# Patient Record
Sex: Female | Born: 1990 | Race: Black or African American | Hispanic: No | Marital: Single | Smoking: Never smoker
Health system: Southern US, Community
[De-identification: ages and names within clinical notes are randomized; demographics above are authoritative.]

## PROBLEM LIST (undated history)

## (undated) DIAGNOSIS — Z973 Presence of spectacles and contact lenses: Secondary | ICD-10-CM

## (undated) DIAGNOSIS — N201 Calculus of ureter: Secondary | ICD-10-CM

## (undated) DIAGNOSIS — R002 Palpitations: Secondary | ICD-10-CM

## (undated) DIAGNOSIS — R569 Unspecified convulsions: Secondary | ICD-10-CM

## (undated) DIAGNOSIS — D509 Iron deficiency anemia, unspecified: Secondary | ICD-10-CM

## (undated) DIAGNOSIS — Z87898 Personal history of other specified conditions: Secondary | ICD-10-CM

## (undated) DIAGNOSIS — G43909 Migraine, unspecified, not intractable, without status migrainosus: Secondary | ICD-10-CM

## (undated) DIAGNOSIS — G5600 Carpal tunnel syndrome, unspecified upper limb: Secondary | ICD-10-CM

## (undated) DIAGNOSIS — R3915 Urgency of urination: Secondary | ICD-10-CM

## (undated) DIAGNOSIS — N200 Calculus of kidney: Secondary | ICD-10-CM

## (undated) DIAGNOSIS — G5603 Carpal tunnel syndrome, bilateral upper limbs: Secondary | ICD-10-CM

## (undated) DIAGNOSIS — Z87442 Personal history of urinary calculi: Secondary | ICD-10-CM

## (undated) DIAGNOSIS — F3181 Bipolar II disorder: Secondary | ICD-10-CM

## (undated) HISTORY — PX: APPENDECTOMY: SHX54

## (undated) HISTORY — DX: Bipolar II disorder: F31.81

---
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
## 2008-02-03 ENCOUNTER — Telehealth: Payer: Self-pay | Admitting: Family Medicine

## 2008-02-15 ENCOUNTER — Ambulatory Visit: Payer: Self-pay | Admitting: Family Medicine

## 2008-02-15 LAB — CONVERTED CEMR LAB: Beta hcg, urine, semiquantitative: NEGATIVE

## 2008-06-28 ENCOUNTER — Ambulatory Visit: Payer: Self-pay | Admitting: Family Medicine

## 2008-06-28 LAB — CONVERTED CEMR LAB: Beta hcg, urine, semiquantitative: NEGATIVE

## 2008-08-11 ENCOUNTER — Ambulatory Visit: Payer: Self-pay | Admitting: Family Medicine

## 2008-08-11 LAB — CONVERTED CEMR LAB
Bilirubin Urine: NEGATIVE
Glucose, Urine, Semiquant: NEGATIVE
Ketones, urine, test strip: NEGATIVE
Nitrite: NEGATIVE
Protein, U semiquant: >=300
Specific Gravity, Urine: 1.02
Urobilinogen, UA: 0.2
WBC Urine, dipstick: NEGATIVE
pH: 7

## 2008-08-12 DIAGNOSIS — F319 Bipolar disorder, unspecified: Secondary | ICD-10-CM | POA: Insufficient documentation

## 2008-08-12 DIAGNOSIS — F17200 Nicotine dependence, unspecified, uncomplicated: Secondary | ICD-10-CM | POA: Insufficient documentation

## 2008-10-04 ENCOUNTER — Ambulatory Visit: Payer: Self-pay | Admitting: Family Medicine

## 2008-11-07 ENCOUNTER — Telehealth: Payer: Self-pay | Admitting: Family Medicine

## 2008-11-08 ENCOUNTER — Ambulatory Visit: Payer: Self-pay | Admitting: Family Medicine

## 2008-11-08 ENCOUNTER — Encounter (INDEPENDENT_AMBULATORY_CARE_PROVIDER_SITE_OTHER): Payer: Self-pay

## 2008-11-08 DIAGNOSIS — J019 Acute sinusitis, unspecified: Secondary | ICD-10-CM | POA: Insufficient documentation

## 2008-11-08 DIAGNOSIS — J209 Acute bronchitis, unspecified: Secondary | ICD-10-CM | POA: Insufficient documentation

## 2008-11-29 ENCOUNTER — Encounter: Payer: Self-pay | Admitting: Family Medicine

## 2008-12-28 ENCOUNTER — Ambulatory Visit: Payer: Self-pay | Admitting: Family Medicine

## 2009-01-17 ENCOUNTER — Ambulatory Visit: Payer: Self-pay | Admitting: Family Medicine

## 2009-01-17 ENCOUNTER — Encounter: Payer: Self-pay | Admitting: Family Medicine

## 2009-01-17 ENCOUNTER — Other Ambulatory Visit: Admission: RE | Admit: 2009-01-17 | Discharge: 2009-01-17 | Payer: Self-pay | Admitting: Family Medicine

## 2009-01-17 DIAGNOSIS — N760 Acute vaginitis: Secondary | ICD-10-CM | POA: Insufficient documentation

## 2009-01-17 DIAGNOSIS — N3000 Acute cystitis without hematuria: Secondary | ICD-10-CM | POA: Insufficient documentation

## 2009-01-17 DIAGNOSIS — H547 Unspecified visual loss: Secondary | ICD-10-CM | POA: Insufficient documentation

## 2009-01-17 DIAGNOSIS — R5381 Other malaise: Secondary | ICD-10-CM | POA: Insufficient documentation

## 2009-01-17 DIAGNOSIS — R5383 Other fatigue: Secondary | ICD-10-CM

## 2009-01-17 LAB — CONVERTED CEMR LAB
Bilirubin Urine: NEGATIVE
Chlamydia, DNA Probe: NEGATIVE
GC Probe Amp, Genital: NEGATIVE
Glucose, Urine, Semiquant: NEGATIVE
Ketones, urine, test strip: NEGATIVE
Nitrite: NEGATIVE
Protein, U semiquant: NEGATIVE
Specific Gravity, Urine: 1.025
Urobilinogen, UA: 0.2
WBC Urine, dipstick: NEGATIVE
pH: 6.5

## 2009-01-18 ENCOUNTER — Encounter: Payer: Self-pay | Admitting: Family Medicine

## 2009-01-18 LAB — CONVERTED CEMR LAB
Candida species: NEGATIVE
Gardnerella vaginalis: POSITIVE — AB

## 2009-03-08 ENCOUNTER — Telehealth: Payer: Self-pay | Admitting: Family Medicine

## 2009-03-21 ENCOUNTER — Ambulatory Visit: Payer: Self-pay | Admitting: Family Medicine

## 2009-04-16 ENCOUNTER — Encounter: Payer: Self-pay | Admitting: Family Medicine

## 2009-04-18 ENCOUNTER — Ambulatory Visit: Payer: Self-pay | Admitting: Family Medicine

## 2009-04-19 LAB — CONVERTED CEMR LAB
BUN: 11 mg/dL (ref 6–23)
CO2: 21 meq/L (ref 19–32)
Calcium: 10 mg/dL (ref 8.4–10.5)
Chloride: 105 meq/L (ref 96–112)
Cholesterol: 168 mg/dL (ref 0–200)
Creatinine, Ser: 0.79 mg/dL (ref 0.40–1.20)
Glucose, Bld: 85 mg/dL (ref 70–99)
HCT: 38.5 % (ref 36.0–46.0)
HDL: 37 mg/dL — ABNORMAL LOW (ref >39)
Hemoglobin: 12.9 g/dL (ref 12.0–15.0)
LDL Cholesterol: 122 mg/dL — ABNORMAL HIGH (ref 0–99)
MCHC: 33.5 g/dL (ref 30.0–36.0)
MCV: 91.4 fL (ref 78.0–100.0)
Platelets: 239 10*3/uL (ref 150–400)
Potassium: 5.3 meq/L (ref 3.5–5.3)
RBC: 4.21 M/uL (ref 3.87–5.11)
RDW: 12.4 % (ref 11.5–15.5)
Sodium: 143 meq/L (ref 135–145)
TSH: 0.891 microintl units/mL (ref 0.700–6.400)
Total CHOL/HDL Ratio: 4.5
Triglycerides: 44 mg/dL (ref <150)
VLDL: 9 mg/dL (ref 0–40)
WBC: 5.1 10*3/uL (ref 4.0–10.5)

## 2009-04-22 DIAGNOSIS — R635 Abnormal weight gain: Secondary | ICD-10-CM | POA: Insufficient documentation

## 2009-04-22 DIAGNOSIS — R519 Headache, unspecified: Secondary | ICD-10-CM | POA: Insufficient documentation

## 2009-04-22 DIAGNOSIS — R51 Headache: Secondary | ICD-10-CM | POA: Insufficient documentation

## 2009-04-23 ENCOUNTER — Telehealth: Payer: Self-pay | Admitting: Family Medicine

## 2009-04-25 ENCOUNTER — Encounter: Payer: Self-pay | Admitting: Family Medicine

## 2009-05-02 ENCOUNTER — Ambulatory Visit: Payer: Self-pay | Admitting: Family Medicine

## 2009-05-02 DIAGNOSIS — J040 Acute laryngitis: Secondary | ICD-10-CM | POA: Insufficient documentation

## 2009-05-14 ENCOUNTER — Encounter: Payer: Self-pay | Admitting: Family Medicine

## 2009-05-19 DIAGNOSIS — Q283 Other malformations of cerebral vessels: Secondary | ICD-10-CM

## 2009-05-19 HISTORY — DX: Other malformations of cerebral vessels: Q28.3

## 2009-05-24 ENCOUNTER — Ambulatory Visit: Payer: Self-pay | Admitting: Family Medicine

## 2009-05-24 DIAGNOSIS — R112 Nausea with vomiting, unspecified: Secondary | ICD-10-CM | POA: Insufficient documentation

## 2009-05-25 ENCOUNTER — Encounter: Payer: Self-pay | Admitting: Family Medicine

## 2009-06-06 ENCOUNTER — Encounter: Payer: Self-pay | Admitting: Family Medicine

## 2009-06-11 ENCOUNTER — Telehealth: Payer: Self-pay | Admitting: Family Medicine

## 2009-06-11 ENCOUNTER — Ambulatory Visit (HOSPITAL_COMMUNITY): Admission: RE | Admit: 2009-06-11 | Discharge: 2009-06-11 | Payer: Self-pay | Admitting: Neurology

## 2009-06-13 ENCOUNTER — Encounter (INDEPENDENT_AMBULATORY_CARE_PROVIDER_SITE_OTHER): Payer: Self-pay

## 2009-06-13 ENCOUNTER — Ambulatory Visit: Payer: Self-pay | Admitting: Family Medicine

## 2009-06-13 DIAGNOSIS — M542 Cervicalgia: Secondary | ICD-10-CM | POA: Insufficient documentation

## 2009-06-15 ENCOUNTER — Ambulatory Visit (HOSPITAL_COMMUNITY): Admission: RE | Admit: 2009-06-15 | Discharge: 2009-06-15 | Payer: Self-pay | Admitting: Neurology

## 2009-06-19 ENCOUNTER — Encounter: Payer: Self-pay | Admitting: Family Medicine

## 2009-06-19 ENCOUNTER — Encounter (HOSPITAL_COMMUNITY): Admission: RE | Admit: 2009-06-19 | Discharge: 2009-07-19 | Payer: Self-pay | Admitting: Family Medicine

## 2009-06-29 ENCOUNTER — Encounter: Payer: Self-pay | Admitting: Family Medicine

## 2009-07-05 ENCOUNTER — Telehealth: Payer: Self-pay | Admitting: Family Medicine

## 2009-07-06 ENCOUNTER — Encounter: Payer: Self-pay | Admitting: Family Medicine

## 2009-07-13 ENCOUNTER — Encounter: Payer: Self-pay | Admitting: Family Medicine

## 2009-07-20 ENCOUNTER — Encounter (HOSPITAL_COMMUNITY): Admission: RE | Admit: 2009-07-20 | Discharge: 2009-08-19 | Payer: Self-pay | Admitting: Family Medicine

## 2009-07-25 ENCOUNTER — Ambulatory Visit: Payer: Self-pay | Admitting: Family Medicine

## 2009-08-06 ENCOUNTER — Telehealth: Payer: Self-pay | Admitting: Family Medicine

## 2009-09-19 ENCOUNTER — Ambulatory Visit: Payer: Self-pay | Admitting: Family Medicine

## 2009-09-19 LAB — CONVERTED CEMR LAB: Beta hcg, urine, semiquantitative: NEGATIVE

## 2009-11-14 ENCOUNTER — Telehealth: Payer: Self-pay | Admitting: Family Medicine

## 2009-12-12 ENCOUNTER — Ambulatory Visit: Payer: Self-pay | Admitting: Family Medicine

## 2009-12-12 DIAGNOSIS — J9801 Acute bronchospasm: Secondary | ICD-10-CM | POA: Insufficient documentation

## 2009-12-13 ENCOUNTER — Emergency Department (HOSPITAL_COMMUNITY): Admission: EM | Admit: 2009-12-13 | Discharge: 2009-12-13 | Payer: Self-pay | Admitting: Emergency Medicine

## 2009-12-20 ENCOUNTER — Encounter (INDEPENDENT_AMBULATORY_CARE_PROVIDER_SITE_OTHER): Payer: Self-pay | Admitting: General Surgery

## 2009-12-20 ENCOUNTER — Observation Stay (HOSPITAL_COMMUNITY): Admission: EM | Admit: 2009-12-20 | Discharge: 2009-12-21 | Payer: Self-pay | Admitting: Emergency Medicine

## 2009-12-20 HISTORY — PX: LAPAROSCOPIC APPENDECTOMY: SHX408

## 2010-02-04 ENCOUNTER — Ambulatory Visit: Payer: Self-pay | Admitting: Family Medicine

## 2010-02-04 ENCOUNTER — Other Ambulatory Visit: Admission: RE | Admit: 2010-02-04 | Discharge: 2010-02-04 | Payer: Self-pay | Admitting: Family Medicine

## 2010-02-05 ENCOUNTER — Encounter: Payer: Self-pay | Admitting: Family Medicine

## 2010-02-05 LAB — CONVERTED CEMR LAB
Candida species: NEGATIVE
Chlamydia, DNA Probe: NEGATIVE
GC Probe Amp, Genital: NEGATIVE
Gardnerella vaginalis: NEGATIVE

## 2010-02-07 ENCOUNTER — Telehealth: Payer: Self-pay | Admitting: Family Medicine

## 2010-02-08 LAB — CONVERTED CEMR LAB: Pap Smear: NEGATIVE

## 2010-02-17 ENCOUNTER — Emergency Department (HOSPITAL_COMMUNITY): Admission: EM | Admit: 2010-02-17 | Discharge: 2010-02-18 | Payer: Self-pay | Admitting: Emergency Medicine

## 2010-02-19 ENCOUNTER — Telehealth: Payer: Self-pay | Admitting: Family Medicine

## 2010-02-20 ENCOUNTER — Telehealth: Payer: Self-pay | Admitting: Family Medicine

## 2010-02-22 ENCOUNTER — Emergency Department (HOSPITAL_COMMUNITY): Admission: EM | Admit: 2010-02-22 | Discharge: 2010-02-22 | Payer: Self-pay | Admitting: Emergency Medicine

## 2010-02-27 ENCOUNTER — Ambulatory Visit: Payer: Self-pay | Admitting: Family Medicine

## 2010-03-08 ENCOUNTER — Telehealth: Payer: Self-pay | Admitting: Family Medicine

## 2010-03-08 ENCOUNTER — Emergency Department (HOSPITAL_COMMUNITY): Admission: EM | Admit: 2010-03-08 | Discharge: 2010-03-08 | Payer: Self-pay | Admitting: Emergency Medicine

## 2010-05-16 ENCOUNTER — Emergency Department (HOSPITAL_COMMUNITY)
Admission: EM | Admit: 2010-05-16 | Discharge: 2010-05-16 | Payer: Self-pay | Source: Home / Self Care | Admitting: Emergency Medicine

## 2010-05-16 ENCOUNTER — Ambulatory Visit: Payer: Self-pay | Admitting: Family Medicine

## 2010-05-21 ENCOUNTER — Encounter: Payer: Self-pay | Admitting: Family Medicine

## 2010-05-22 ENCOUNTER — Ambulatory Visit: Admit: 2010-05-22 | Payer: Self-pay | Admitting: Family Medicine

## 2010-05-22 ENCOUNTER — Encounter: Payer: Self-pay | Admitting: Family Medicine

## 2010-06-18 NOTE — Progress Notes (Signed)
Summary: patient at ER  Phone Note Call from Patient   Summary of Call: patient called back in and states she is at the ER. Initial call taken by: Curtis Sites,  March 08, 2010 2:27 PM  Follow-up for Phone Call        noted Follow-up by: Adella Hare LPN,  March 08, 2010 2:31 PM

## 2010-06-18 NOTE — Miscellaneous (Signed)
Summary: Rehab Report  Rehab Report   Imported By: Lind Guest 07/23/2009 10:02:01  _____________________________________________________________________  External Attachment:    Type:   Image     Comment:   External Document

## 2010-06-18 NOTE — Progress Notes (Signed)
Summary: SPEAK WITH NURSE  Phone Note Call from Patient   Summary of Call: NEEDS TO SPEAK WITH NURSE ABOUT DAUGHTER. [facility] [phone] Initial call taken by: Rudene Anda,  February 03, 2008 3:25 PM  Follow-up for Phone Call        PATIENT UNAWARE OF WHY MOTHER CALLED, ADVISED IF MOTHER NEEDS ANYTHING TO HAVE HER CALL THE OFFICE Follow-up by: Worthy Keeler LPN,  February 03, 2008 3:59 PM

## 2010-06-18 NOTE — Assessment & Plan Note (Signed)
Summary: office visit  Nurse Visit   Allergies: 1)  ! * Rubbing Alcohol  Medication Administration  Injection # 1:    Medication: Depo-Provera 150mg     Diagnosis: CONTRACEPTIVE MANAGEMENT (ICD-V25.09)    Route: IM    Site: RUOQ gluteus    Exp Date: 1/14    Lot #: Haywood Pao    Mfr: [email or web address]    Patient tolerated injection without complications    Given by: Adella Hare LPN (February 27, 2010 4:07 PM)  Orders Added: 1)  Depo-Provera 150mg  [J1055] 2)  Admin of Therapeutic Inj  intramuscular or subcutaneous [[id number]]   Medication Administration  Injection # 1:    Medication: Depo-Provera 150mg     Diagnosis: CONTRACEPTIVE MANAGEMENT (ICD-V25.09)    Route: IM    Site: RUOQ gluteus    Exp Date: 1/14    Lot #: Haywood Pao    Mfr: greenstone    Patient tolerated injection without complications    Given by: Adella Hare LPN (February 27, 2010 4:07 PM)  Orders Added: 1)  Depo-Provera 150mg  [J1055] 2)  Admin of Therapeutic Inj  intramuscular or subcutaneous [[id number]]

## 2010-06-18 NOTE — Progress Notes (Signed)
Summary: RX  Phone Note Call from Patient   Summary of Call: NEEDS RX FOR FOR HER DEPAKOTE 500MG   SHE TAKES 3 TIMES DAILY   [facility] [place]  [facility] AND TELL THEM WHEN THE RX IS SENT IN  [phone] Initial call taken by: Lind Guest,  December 21, 2007 2:19 PM  Follow-up for Phone Call        pt needs script written doesnt have prescription Follow-up by: Worthy Keeler LPN,  December 21, 2007 2:39 PM  Additional Follow-up for Phone Call Additional follow up Details #1::        called refill in to pharmacy and left pt message Additional Follow-up by: Worthy Keeler LPN,  December 22, 2007 11:48 AM    New/Updated Medications: DEPAKOTE 500 MG  TBEC (DIVALPROEX SODIUM) one tab by mouth two times a day   Prescriptions: DEPAKOTE 500 MG  TBEC (DIVALPROEX SODIUM) one tab by mouth two times a day  #60 x 2   Entered by:   Worthy Keeler LPN   Authorized by:   Syliva Overman MD   Signed by:   Worthy Keeler LPN on 09/81/1914   Method used:   Telephoned to ...         RxID:   [id number]

## 2010-06-18 NOTE — Assessment & Plan Note (Signed)
Summary: office visit   Allergies: 1)  ! * Rubbing Alcohol   Complete Medication List: 1)  Depakote 500 Mg Tbec (Divalproex sodium) .... Two tabs by mouth in the morning and one at night 2)  Singulair 10 Mg Tabs (Montelukast sodium) .... Take 1 tablet by mouth once a day 3)  Proventil Hfa 108 (90 Base) Mcg/act Aers (Albuterol sulfate) .... 2 puffs every 6 to 8 hrs. as needed 4)  Flagyl 500 Mg Tabs (Metronidazole) .... One tab by mouth bid 5)  Ibuprofen 800 Mg Tabs (Ibuprofen) .... Take 1 tablet by mouth two times a day as needed pt left without being seen, came in the following 2 days

## 2010-06-18 NOTE — Miscellaneous (Signed)
Summary: Rehab Report  Rehab Report   Imported By: Lind Guest 09/20/2009 13:21:50  _____________________________________________________________________  External Attachment:    Type:   Image     Comment:   External Document

## 2010-06-18 NOTE — Letter (Signed)
Summary: Out of Work  [facility]  [address]   [place], [place] [address]   Phone: [phone]  Fax: [phone]    June 13, 2009   Employee:  Marie Brown    To Whom It May Concern:   For Medical reasons, please excuse the above named employee from work for the following dates:  Start:   06/08/2009  End:   07/04/2009 Without Restrictions  If you need additional information, please feel free to contact our office.         Sincerely,    Milus Mallick. Lodema Hong, M.D.

## 2010-06-18 NOTE — Assessment & Plan Note (Signed)
Summary: office visit   Vital Signs:  Patient profile:   20 year old female Menstrual status:  depo with spotting Height:      64 inches Weight:      141 pounds BMI:     24.29 Pulse rate:   75 / minute Pulse rhythm:   regular Resp:     16 per minute BP sitting:   110 / 70 Cuff size:   regular  Vitals Entered By: Everitt Amber (April 18, 2009 10:05 AM) CC: She is concerned about her weight gain, feeling tired all the time, also her period comes on 2 times in a month and its irregular and cramps are really bad   Primary Care Provider:  Syliva Overman MD  CC:  She is concerned about her weight gain, feeling tired all the time, and also her period comes on 2 times in a month and its irregular and cramps are really bad.  History of Present Illness: The pt has a 2 day h/o increased head and chest congestion, with chills and fever. Her sputum and nasal drainage are yellow. She and her mother are very concerned about her weight gain. She does eat alot of fast food andis on no regular exercise program. Weight has not been an issue in the past, but with depo use this is now apparently and issue. Seh hsalso had a small amt of spotting with the depo.    Current Medications (verified): 1)  Depakote 500 Mg  Tbec (Divalproex Sodium) .... Two Tabs By Mouth in The Morning and One At Night 2)  Singulair 10 Mg Tabs (Montelukast Sodium) .... Take 1 Tablet By Mouth Once A Day 3)  Proventil Hfa 108 (90 Base) Mcg/act Aers (Albuterol Sulfate) .... 2 Puffs Every 6 To 8 Hrs. As Needed 4)  Ibuprofen 800 Mg Tabs (Ibuprofen) .... Take 1 Tablet By Mouth Two Times A Day As Needed  Allergies (verified): 1)  ! * Rubbing Alcohol  Review of Systems      See HPI General:  Complains of chills, fatigue, and malaise; 2 day history. ENT:  Complains of earache, sinus pressure, and sore throat; 2 day history. CV:  Denies chest pain or discomfort, palpitations, and swelling of feet. Resp:  Complains of cough and  sputum productive; 2 day hiastory, clear sputum. GI:  Denies abdominal pain, constipation, diarrhea, nausea, and vomiting. GU:  Complains of abnormal vaginal bleeding; denies discharge and dysuria. MS:  Denies joint pain and stiffness. Neuro:  Complains of headaches; migraines have resumed this week daily, last week was twice. Psych:  Complains of mental problems; denies suicidal thoughts/plans, thoughts of violence, and unusual visions or sounds.  Physical Exam  General:  Well-developed,well-nourished,in no acute distress; alert,appropriate and cooperative throughout examination HEENT: No facial asymmetry,  EOMI, positive  sinus tenderness, TM's Clear, oropharynx  pink and moist.   Chest: decreased airentry, scattered crackles and wheezes  CVS: S1, S2, No murmurs, No S3.   Abd: Soft, Nontender.  MS: Adequate ROM spine, hips, shoulders and knees.  Ext: No edema.   CNS: CN 2-12 intact, power tone and sensation normal throughout.   Skin: Intact, no visible lesions or rashes.  Psych: Good eye contact, normal affect.  Memory intact, not anxious or depressed appearing.     Impression & Recommendations:  Problem # 1:  ACUTE BRONCHITIS (ICD-466.0) Assessment Comment Only  The following medications were removed from the medication list:    Proventil Hfa 108 (90 Base) Mcg/act Aers (Albuterol  sulfate) .Marland Kitchen... 2 puffs every 6 to 8 hrs. as needed    Flagyl 500 Mg Tabs (Metronidazole) ..... One tab by mouth bid Her updated medication list for this problem includes:    Singulair 10 Mg Tabs (Montelukast sodium) .Marland Kitchen... Take 1 tablet by mouth once a day    Septra Ds 800-160 Mg Tabs (Sulfamethoxazole-trimethoprim) .Marland Kitchen... Take 1 tablet by mouth two times a day    Tessalon Perles 100 Mg Caps (Benzonatate) .Marland Kitchen... Take 1 capsule by mouth three times a day  Problem # 2:  BIPOLAR DISORDER UNSPECIFIED (ICD-296.80) Assessment: Improved pt is being treated by psychiatry, she has been working for the past  approx 4 to 5 months  Problem # 3:  ACUTE SINUSITIS, UNSPECIFIED (ICD-461.9) Assessment: Comment Only  The following medications were removed from the medication list:    Flagyl 500 Mg Tabs (Metronidazole) ..... One tab by mouth bid Her updated medication list for this problem includes:    Septra Ds 800-160 Mg Tabs (Sulfamethoxazole-trimethoprim) .Marland Kitchen... Take 1 tablet by mouth two times a day    Tessalon Perles 100 Mg Caps (Benzonatate) .Marland Kitchen... Take 1 capsule by mouth three times a day  Problem # 4:  WEIGHT GAIN (ICD-783.1) Assessment: Comment Only pt to start regular eexercise and reduce caloric intake to facilitate weight loss  Problem # 5:  HEADACHE (ICD-784.0) Assessment: Deteriorated  The following medications were removed from the medication list:    Ibuprofen 800 Mg Tabs (Ibuprofen) .Marland Kitchen... Take 1 tablet by mouth two times a day as needed, pt to start topamax as prophylaxis  Complete Medication List: 1)  Depakote 500 Mg Tbec (Divalproex sodium) .... Two tabs by mouth in the morning and one at night 2)  Singulair 10 Mg Tabs (Montelukast sodium) .... Take 1 tablet by mouth once a day 3)  Topamax 25 Mg Tabs (Topiramate) .... Take 1 tablet by mouth two times a day 4)  Septra Ds 800-160 Mg Tabs (Sulfamethoxazole-trimethoprim) .... Take 1 tablet by mouth two times a day 5)  Tessalon Perles 100 Mg Caps (Benzonatate) .... Take 1 capsule by mouth three times a day  Other Orders: T-Basic Metabolic Panel [id number]) T-Lipid Profile [id number]) T-CBC No Diff ([id number]) T-TSH ([id number])  Patient Instructions: 1)  f/u  with mD Jan 26, 20122 2)  It is important that you exercise regularly at least 20 minutes 5 times a week. If you develop chest pain, have severe difficulty breathing, or feel very tired , stop exercising immediately and seek medical attention. 3)  You need to lose weight. Consider a lower calorie diet and regular exercise.  4)  you will be off depo in January  Prescriptions: TESSALON PERLES 100 MG CAPS (BENZONATATE) Take 1 capsule by mouth three times a day  #30 x 0   Entered and Authorized by:   Syliva Overman MD   Signed by:   Syliva Overman MD on 04/18/2009   Method used:   [facility]  [address] [address] [facility]       [address]       [place], [place]  [address]       Ph: [phone] or [phone]       Fax: [phone]   RxID:   [id number] SEPTRA DS 800-160 MG TABS (SULFAMETHOXAZOLE-TRIMETHOPRIM) Take 1 tablet by mouth two times a day  #20 x 0   Entered and Authorized by:  Syliva Overman MD   Signed by:   Syliva Overman MD on 04/18/2009   Method used:   [facility]        [facility]  [address] [address] [facility]       [address]       [place], [place]  [address]       Ph: [phone] or [phone]       Fax: [phone]   RxID:   [id number] TOPAMAX 25 MG TABS (TOPIRAMATE) Take 1 tablet by mouth two times a day  #60 x 2   Entered and Authorized by:   Syliva Overman MD   Signed by:   Syliva Overman MD on 04/18/2009   Method used:   [facility]        [facility]  [address] [facility]       [address]       [place], [place]  [address]       Ph: [phone] or [phone]       Fax: [phone]   RxID:   [id number]

## 2010-06-18 NOTE — Letter (Signed)
Summary: Out of Work  [facility]  [address]   [place], [place] [address]   Phone: [phone]  Fax: [phone]    April 25, 2009   Employee:  CARLETTE PALMATIER    To Whom It May Concern:   For Medical reasons, please excuse the above named employee from work for the following dates:  Start:   04/18/09  End:   04/30/09 to return with no restrictions  If you need additional information, please feel free to contact our office.         Sincerely,    Worthy Keeler LPN

## 2010-06-18 NOTE — Progress Notes (Signed)
Summary: PHYSICAL THERAPY  PHYSICAL THERAPY   Imported By: Lind Guest 06/21/2009 15:57:38  _____________________________________________________________________  External Attachment:    Type:   Image     Comment:   External Document

## 2010-06-18 NOTE — Assessment & Plan Note (Signed)
Summary: depo inj  Nurse Visit   Vital Signs:  Patient profile:   20 year old female Menstrual status:  depo with spotting Height:      64 inches O2 Sat:      98 % Pulse rate:   82 / minute Resp:     16 per minute BP sitting:   120 / 68 Cuff size:   regular  Vitals Entered By: Everitt Amber (March 21, 2009 3:59 PM) CC: Patient in for depo provera injection Comments Advised to return in 12 weeks around Jan 26   Allergies: 1)  ! * Rubbing Alcohol  Medication Administration  Injection # 1:    Medication: Depo-Provera 150mg     Diagnosis: FAMILY PLANNING (ICD-V25.09)    Route: IM    Site: LUOQ gluteus    Exp Date: 10/2010    Lot #: [id number]    Mfr: Pharmacia    Comments: 150 mg given    Patient tolerated injection without complications    Given by: Everitt Amber (March 21, 2009 4:01 PM)  Orders Added: 1)  Depo-Provera 150mg  [J1055] 2)  Admin of Therapeutic Inj  intramuscular or subcutaneous [[id number]]   Medication Administration  Injection # 1:    Medication: Depo-Provera 150mg     Diagnosis: FAMILY PLANNING (ICD-V25.09)    Route: IM    Site: LUOQ gluteus    Exp Date: 10/2010    Lot #: [id number]    Mfr: Pharmacia    Comments: 150 mg given    Patient tolerated injection without complications    Given by: Everitt Amber (March 21, 2009 4:01 PM)  Orders Added: 1)  Depo-Provera 150mg  [J1055] 2)  Admin of Therapeutic Inj  intramuscular or subcutaneous [[id number]]

## 2010-06-18 NOTE — Progress Notes (Signed)
Summary: please advise  Phone Note Call from Patient   Summary of Call: patient has been throwing up since 5 this morning, wanted to know if she could get some phenagan called in she uses [facility] Initial call taken by: Curtis Sites,  March 08, 2010 10:17 AM  Follow-up for Phone Call        pls refill the 12 phenergan she last got, tell her if uncontrolled then ed Follow-up by: Syliva Overman MD,  March 08, 2010 12:27 PM  Additional Follow-up for Phone Call Additional follow up Details #1::        Rx Called In, left detailed voicemail Additional Follow-up by: Adella Hare LPN,  March 08, 2010 2:26 PM    Prescriptions: PROMETHAZINE HCL 12.5 MG TABS (PROMETHAZINE HCL) Take 1 tablet by mouth two times a day  #12 x 0   Entered by:   Adella Hare LPN   Authorized by:   Syliva Overman MD   Signed by:   Adella Hare LPN on 16/02/9603   Method used:   Electronically to        [facility]* (retail)       [address]       [place], [place]  [address]       Ph: [phone]       Fax: [phone]   RxID:   [id number] PROMETHAZINE HCL 12.5 MG TABS (PROMETHAZINE HCL) Take 1 tablet by mouth two times a day  #12 x 0   Entered by:   Adella Hare LPN   Authorized by:   Syliva Overman MD   Signed by:   Adella Hare LPN on 03/15/2535   Method used:   Electronically to        [facility]  [address] (retail)       [address]       [place], [place]  [address]       Ph: [phone]       Fax: [phone]   RxID:   [id number]

## 2010-06-18 NOTE — Assessment & Plan Note (Signed)
Summary: DEPO  Nurse Visit   Vital Signs:  Patient profile:   20 year old female Menstrual status:  depo with spotting Height:      64 inches Weight:      133.56 pounds BMI:     23.01 O2 Sat:      97 % Pulse rate:   65 / minute Resp:     16 per minute BP sitting:   108 / 70  (left arm) Cuff size:   regular  Vitals Entered By: Everitt Amber (December 28, 2008 11:33 AM) Comments in to get depo provera injection   Allergies: 1)  ! * Rubbing Alcohol  Medication Administration  Injection # 1:    Medication: Depo-Provera 150mg     Diagnosis: FAMILY PLANNING (ICD-V25.09)    Route: IM    Site: RUOQ gluteus    Exp Date: 10/2010    Lot #: [id number]    Mfr: Pharmacia    Comments: 150 mg given    Patient tolerated injection without complications    Given by: Everitt Amber (December 28, 2008 11:37 AM)  Orders Added: 1)  Depo-Provera 150mg  [J1055] 2)  Admin of Therapeutic Inj  intramuscular or subcutaneous [[id number]]   Medication Administration  Injection # 1:    Medication: Depo-Provera 150mg     Diagnosis: FAMILY PLANNING (ICD-V25.09)    Route: IM    Site: RUOQ gluteus    Exp Date: 10/2010    Lot #: [id number]    Mfr: Pharmacia    Comments: 150 mg given    Patient tolerated injection without complications    Given by: Everitt Amber (December 28, 2008 11:37 AM)  Orders Added: 1)  Depo-Provera 150mg  [J1055] 2)  Admin of Therapeutic Inj  intramuscular or subcutaneous [[id number]]

## 2010-06-18 NOTE — Letter (Signed)
Summary: Letter  Letter   Imported By: Lind Guest 07/25/2009 14:19:25  _____________________________________________________________________  External Attachment:    Type:   Image     Comment:   External Document

## 2010-06-18 NOTE — Progress Notes (Signed)
Summary: [facility]   Imported By: Lind Guest 06/29/2009 09:28:55  _____________________________________________________________________  External Attachment:    Type:   Image     Comment:   External Document

## 2010-06-18 NOTE — Letter (Signed)
Summary: Out of Work  [facility]  [address]   [place], [place] [address]   Phone: [phone]  Fax: [phone]    June 13, 2009   Employee:  ELIDE STALZER    To Whom It May Concern:   For Medical reasons, please excuse the above named employee from work for the following dates:  Start:   06/08/2009  End:   07/04/2009  If you need additional information, please feel free to contact our office.         Sincerely,    Milus Mallick. Lodema Hong, M.D.

## 2010-06-18 NOTE — Progress Notes (Signed)
Summary: INHALER   Phone Note Call from Patient   Summary of Call: WANTS INHALER HAS APPT IN THE MORNING AT 8:15      [facility] [place]  [phone] Initial call taken by: Lind Guest,  November 07, 2008 3:14 PM  Follow-up for Phone Call        prescription sent in attempted to notify pt msg left Follow-up by: Syliva Overman MD,  November 07, 2008 6:38 PM    New/Updated Medications: PROVENTIL HFA 108 (90 BASE) MCG/ACT AERS (ALBUTEROL SULFATE) 2 puffs every 6 to 8 hrs. as needed   Prescriptions: PROVENTIL HFA 108 (90 BASE) MCG/ACT AERS (ALBUTEROL SULFATE) 2 puffs every 6 to 8 hrs. as needed  #1 x 3   Entered and Authorized by:   Syliva Overman MD   Signed by:   Syliva Overman MD on 11/07/2008   Method used:   [facility]        [facility]  [address] [facility]       [address]       [place], [place]  [address]       Ph: [phone] or [phone]       Fax: [phone]   RxID:   [id number]

## 2010-06-18 NOTE — Assessment & Plan Note (Signed)
Summary: ov and depo- room 1   Vital Signs:  Patient profile:   20 year old female Menstrual status:  depo with spotting Height:      64 inches Weight:      138.25 pounds BMI:     23.82 O2 Sat:      99 % on Room air Pulse rate:   78 / minute Resp:     16 per minute BP sitting:   100 / 80  (left arm)  Vitals Entered By: Adella Hare LPN (December 12, 2009 4:05 PM) CC: follow-up visit- depo Is Patient Diabetic? No Pain Assessment Patient in pain? no      Comments did not bring meds to ov   Primary Provider:  Syliva Overman MD  CC:  follow-up visit- depo.  History of Present Illness: Pt presents today to the office for her DepoProvera shot.  She is happy with this form of contraception.  No vag bleeding or spotting. Requests refill fluconazole.  She uses this as needed for yeast infections.  Pt needed OV to f/u on breathing.  She had called last mos for a refill of her albuterol inhaler.  Pt states she has not been dx with asthma, but in the hot summer mos will have some intermittent difficulty breathing & wheezing.  This is when she is outside. Uses albuterol 1-2 times per mos during these months.  No allergy syptoms.  No HS awakening.    Allergies (verified): 1)  ! * Rubbing Alcohol  Past History:  Past medical history reviewed for relevance to current acute and chronic problems.  Past Medical History: Reviewed history from 08/11/2008 and no changes required. * BIPOLAR DISEASE Kidney stones dac 2010  Review of Systems General:  Denies chills and fever. ENT:  Denies nasal congestion and sore throat. CV:  Denies chest pain or discomfort. Resp:  Complains of shortness of breath and wheezing; denies cough. GU:  Denies abnormal vaginal bleeding.  Physical Exam  General:  Well-developed,well-nourished,in no acute distress; alert,appropriate and cooperative throughout examination Head:  Normocephalic and atraumatic without obvious abnormalities. No apparent alopecia  or balding. Ears:  External ear exam shows no significant lesions or deformities.  Otoscopic examination reveals clear canals, tympanic membranes are intact bilaterally without bulging, retraction, inflammation or discharge. Hearing is grossly normal bilaterally. Nose:  External nasal examination shows no deformity or inflammation. Nasal mucosa are pink and moist without lesions or exudates. Mouth:  Oral mucosa and oropharynx without lesions or exudates.  Teeth in good repair. Neck:  No deformities, masses, or tenderness noted. Lungs:  Normal respiratory effort, chest expands symmetrically. Lungs are clear to auscultation, no crackles or wheezes. Heart:  Normal rate and regular rhythm. S1 and S2 normal without gallop, murmur, click, rub or other extra sounds. Cervical Nodes:  No lymphadenopathy noted Psych:  Cognition and judgment appear intact. Alert and cooperative with normal attention span and concentration. No apparent delusions, illusions, hallucinations   Impression & Recommendations:  Problem # 1:  ACUTE BRONCHOSPASM (ICD-519.11) Assessment Comment Only  Problem # 2:  CONTRACEPTIVE MANAGEMENT (ICD-V25.09) Assessment: Comment Only  Orders: Depo-Provera 150mg  (J1055) Admin of Therapeutic Inj  intramuscular or subcutaneous (78295)  Problem # 3:  NICOTINE ADDICTION (ICD-305.1) Assessment: Comment Only  Encouraged smoking cessation   Complete Medication List: 1)  Depakote 500 Mg Tbec (Divalproex sodium) .... Two tabs by mouth in the morning and one at night 2)  Topamax 25 Mg Tabs (Topiramate) .... Take 1 tablet by mouth two times  a day 3)  Imipramine Hcl 25 Mg Tabs (Imipramine hcl) .... 2 tabs at bedtime 4)  Fluconazole 150 Mg Tabs (Fluconazole) .... Take 1 tablet by mouth once a day 5)  Proventil Hfa 108 (90 Base) Mcg/act Aers (Albuterol sulfate) .... Two puffs every six to eight hours as needed  Patient Instructions: 1)  Please schedule a follow-up appointment in 2 months for  pap/physical appt. 2)  I have refilled your Fluconazole for yeast infections. 3)  Tobacco is very bad for your health and your loved ones! You Should stop smoking!. 4)  Stop Smoking Tips: Choose a Quit date. Cut down before the Quit date. decide what you will do as a substitute when you feel the urge to smoke(gum,toothpick,exercise). Prescriptions: FLUCONAZOLE 150 MG TABS (FLUCONAZOLE) Take 1 tablet by mouth once a day  #3 x 0   Entered and Authorized by:   Esperanza Sheets PA   Signed by:   Esperanza Sheets PA on 12/12/2009   Method used:   Electronically to        [facility]  [address] (retail)       [address]       [place], [place]  [address]       Ph: [phone]       Fax: [phone]   RxID:   [id number]    Medication Administration  Injection # 1:    Medication: Depo-Provera 150mg     Diagnosis: CONTRACEPTIVE MANAGEMENT (ICD-V25.09)    Route: IM    Site: RUOQ gluteus    Exp Date: 1/14    Lot #: [id number]    Mfr: [email or web address]    Patient tolerated injection without complications    Given by: Adella Hare LPN (December 12, 2009 4:36 PM)  Orders Added: 1)  Est. Patient Level III [36644] 2)  Depo-Provera 150mg  [J1055] 3)  Admin of Therapeutic Inj  intramuscular or subcutaneous [03474]

## 2010-06-18 NOTE — Assessment & Plan Note (Signed)
Summary: depo shot  Nurse Visit   Vital Signs:  Patient Profile:   20 Years Old Female CC:      Nurse visit for depo provera Height:     64 inches O2 Sat:      98 % Pulse rate:   69 / minute Resp:     16 per minute             Comments Nurse visit to recieve depo provera in RUQQ without any complications. Negative urine preg test     Prior Medications: DEPAKOTE 500 MG  TBEC (DIVALPROEX SODIUM) two tabs by mouth in the morning and one at night KEFLEX 250 MG CAPS (CEPHALEXIN) Take 1 capsule by mouth two times a day Current Allergies: ! * RUBBING ALCOHOL Laboratory Results   Urine Tests  Date/Time Received: June 28, 2008  Date/Time Reported: June 28, 2008     Urine HCG: negative Comments: negative hcg      Medication Administration  Injection # 1:    Medication: Depo-Provera 150mg     Diagnosis: FAMILY PLANNING (ICD-V25.09)    Route: IM    Site: RUOQ gluteus    Exp Date: 4/11    Lot #: [id number]    Mfr: Sicor    Patient tolerated injection without complications    Given by: Everitt Amber (June 28, 2008 4:01 PM)  Orders Added: 1)  Urine Pregnancy Test  [81025] 2)  Depo-Provera 150mg  [J1055] 3)  Admin of Therapeutic Inj  intramuscular or subcutaneous [email or web address].Putnam    ]

## 2010-06-18 NOTE — Letter (Signed)
Summary: Out of Work  [facility]  [address]   [place], [place] [address]   Phone: [phone]  Fax: [phone]    May 02, 2009   Employee:  LESHONDA GALAMBOS    To Whom It May Concern:   For Medical reasons, please excuse the above named employee from work for the following dates:  Start:   05/01/09  End:   05/08/09 without restrictions  If you need additional information, please feel free to contact our office.         Sincerely,    Milus Mallick. Lodema Hong, MD

## 2010-06-18 NOTE — Assessment & Plan Note (Signed)
Summary: physical   Vital Signs:  Patient profile:   20 year old female Menstrual status:  depo with spotting LMP:     02/04/2010 Height:      64 inches Weight:      134.25 pounds BMI:     23.13 O2 Sat:      97 % Pulse rate:   71 / minute Pulse rhythm:   regular Resp:     16 per minute BP sitting:   120 / 78  (left arm) Cuff size:   regular  Vitals Entered By: Everitt Amber LPN (February 04, 2010 2:31 PM) CC: CPE  LMP (date): 02/04/2010     Enter LMP: 02/04/2010 Last PAP Result NEGATIVE FOR INTRAEPITHELIAL LESIONS OR MALIGNANCY.   Primary Care Provider:  Syliva Overman MD  CC:  CPE .  History of Present Illness: Reports  thatshe has been doing well. She is in cosmetology school, and is living independently. She had an appendectomy since her last visit. Her headaches are worse, but she stopped all meds, threqw them out, thought they were responsible for the acute nausea she had at the time of appendicits. She will call her neurologist for an appt. Denies recent fever or chills. Denies sinus pressure, nasal congestion , ear pain , she has a  sore throat. Denies chest congestion, or cough productive of sputum. Denies chest pain, palpitations, PND, orthopnea or leg swelling. Denies abdominal pain, nausea, vomitting, diarrhea or constipation. Denies change in bowel movements or bloody stool. Denies dysuria , frequency, incontinence or hesitancy.Irregular menses , primarily spotting near time for her depo, however, currently spotting, 2 weeks,early, will take ocp until depo due to stop a bleed. Still smoking has no quit date. Denies  joint pain, swelling, or reduced mobility. Denies  vertigo, seizures. Denies depression, anxiety or insomnia.continues to be treated by mental health. s/ol painless swelling in righ armpit for the past 5 days.     Current Medications (verified): 1)  Depakote 500 Mg  Tbec (Divalproex Sodium) .... Two Tabs By Mouth in The Morning and One At  Night 2)  Proventil Hfa 108 (90 Base) Mcg/act Aers (Albuterol Sulfate) .... Two Puffs Every Six To Eight Hours As Needed  Allergies (verified): 1)  ! * Rubbing Alcohol  Past History:  Past Surgical History: Surgery on both feet 2006 and 2007 appendectomy 2011 ([facility]ler)  Review of Systems      See HPI General:  Complains of fatigue. Eyes:  Denies blurring and discharge. Neuro:  Complains of headaches. Psych:  Complains of mental problems. Endo:  Denies cold intolerance, excessive thirst, excessive urination, and heat intolerance. Heme:  Denies abnormal bruising and bleeding. Allergy:  Complains of seasonal allergies; denies hives or rash and itching eyes.  Physical Exam  General:  Well-developed,well-nourished,in no acute distress; alert,appropriate and cooperative throughout examination Head:  Normocephalic and atraumatic without obvious abnormalities. No apparent alopecia or balding.Right maxillary tenderness. Eyes:  No corneal or conjunctival inflammation noted. EOMI. Perrla. Funduscopic exam benign, without hemorrhages, exudates or papilledema. Vision grossly normal. Ears:  External ear exam shows no significant lesions or deformities.  Otoscopic examination reveals clear canals, tympanic membranes are intact bilaterally without bulging, retraction, inflammation or discharge. Hearing is grossly normal bilaterally. Nose:  External nasal examination shows no deformity or inflammation. Nasal mucosa are pink and moist without lesions or exudates. Mouth:  Oral mucosa and oropharynx without lesions or exudates.  Teeth in good repair.Erythema of oropharynx Neck:  No deformities, masses, or tenderness noted. Chest  Wall:  No deformities, masses, or tenderness noted. Breasts:  No mass, nodules, thickening, tenderness, bulging, retraction, inflamation, nipple discharge or skin changes noted.   Lungs:  Normal respiratory effort, chest expands symmetrically. Lungs are clear to auscultation, no  crackles or wheezes. Heart:  Normal rate and regular rhythm. S1 and S2 normal without gallop, murmur, click, rub or other extra sounds. Abdomen:  Bowel sounds positive,abdomen soft and non-tender without masses, organomegaly or hernias noted. Rectal:  No external abnormalities noted. Normal sphincter tone. No rectal masses or tenderness. Genitalia:  Normal introitus for age, no external lesions, no vaginal discharge, mucosa pink and moist, no vaginal or cervical lesions, no vaginal atrophy, no friaility or hemorrhage, normal uterus size and position, no adnexal masses or tenderness Msk:  No deformity or scoliosis noted of thoracic or lumbar spine.   Pulses:  R and L carotid,radial,femoral,dorsalis pedis and posterior tibial pulses are full and equal bilaterally Extremities:  No clubbing, cyanosis, edema, or deformity noted with normal full range of motion of all joints.   Neurologic:  No cranial nerve deficits noted. Station and gait are normal. Plantar reflexes are down-going bilaterally. DTRs are symmetrical throughout. Sensory, motor and coordinative functions appear intact. Skin:  Intact cystic , erythematous lesion in right armpit. Cervical Nodes:  No lymphadenopathy noted Axillary Nodes:  No palpable lymphadenopathy Inguinal Nodes:  No significant adenopathy Psych:  Cognition and judgment appear intact. Alert and cooperative with normal attention span and concentration. No apparent delusions, illusions, hallucinations   Impression & Recommendations:  Problem # 1:  SCREENING FOR MALIGNANT NEOPLASM OF THE CERVIX (ICD-V76.2) Assessment Comment Only  Orders: Pap Smear (44034)  Problem # 2:  VAGINITIS (ICD-616.10) Assessment: Comment Only  Her updated medication list for this problem includes:    Septra Ds 800-160 Mg Tabs (Sulfamethoxazole-trimethoprim) .Marland Kitchen... Take 1 tablet by mouth two times a day  Orders: T-Wet Prep by Molecular Probe [phone]) T-Chlamydia & GC Probe, Genital  ([phone])  Problem # 3:  NICOTINE ADDICTION (ICD-305.1) Assessment: Unchanged  Encouraged smoking cessation and discussed different methods for smoking cessation.   Problem # 4:  BIPOLAR DISORDER UNSPECIFIED (ICD-296.80) Assessment: Improved  Problem # 5:  HEADACHE (ICD-784.0) Assessment: Deteriorated pt to sched f/u with neurology  Problem # 6:  ACUTE SINUSITIS, UNSPECIFIED (ICD-461.9) Assessment: Comment Only  Her updated medication list for this problem includes:    Septra Ds 800-160 Mg Tabs (Sulfamethoxazole-trimethoprim) .Marland Kitchen... Take 1 tablet by mouth two times a day  Complete Medication List: 1)  Depakote 500 Mg Tbec (Divalproex sodium) .... Two tabs by mouth in the morning and one at night 2)  Proventil Hfa 108 (90 Base) Mcg/act Aers (Albuterol sulfate) .... Two puffs every six to eight hours as needed 3)  Septra Ds 800-160 Mg Tabs (Sulfamethoxazole-trimethoprim) .... Take 1 tablet by mouth two times a day 4)  Ortho Tri-cyclen (28) 0.18/0.215/0.25 Mg-35 Mcg Tabs (Norgestim-eth estrad triphasic) .... One tablet every day until February 27, 2010 5)  Fluconazole 100 Mg Tabs (Fluconazole) .... Take 1 tablet by mouth once a day as needed for  vaginal itch  Patient Instructions: 1)  f/u 12 weeks after February 27, 2010. 2)  nuRSE visit February 27, 2010 for flu vac and depoprovera 3)  Med is sent in for your sore throat and the bump under your right armpit. 4)  I will send in contraceptive pills ytake 1 daily until you get the depo since you are spotting 5)  ask for your flu vaccine when you  come for the depo injection pls. 6)  you need to call Dr Gerilyn Pilgrim about your headaches. 7)  I am proud of you going to school, keep it up!  Prescriptions: FLUCONAZOLE 100 MG TABS (FLUCONAZOLE) Take 1 tablet by mouth once a day as needed for  vaginal itch  #2 x 0   Entered by:   Everitt Amber LPN   Authorized by:   Syliva Overman MD   Signed by:   Everitt Amber LPN on 45/40/9811   Method  used:   Electronically to        [facility]  [address] (retail)       [address]       [place], [place]  [address]       Ph: [phone]       Fax: [phone]   RxID:   [id number] ORTHO TRI-CYCLEN (28) 0.18/0.215/0.25 MG-35 MCG TABS (NORGESTIM-ETH ESTRAD TRIPHASIC) one tablet every day until February 27, 2010  #1 x 0   Entered by:   Everitt Amber LPN   Authorized by:   Syliva Overman MD   Signed by:   Everitt Amber LPN on 66/44/0347   Method used:   Electronically to        [facility]  [address] (retail)       [address]       [place], [place]  [address]       Ph: [phone]       Fax: [phone]   RxID:   [id number] SEPTRA DS 800-160 MG TABS (SULFAMETHOXAZOLE-TRIMETHOPRIM) Take 1 tablet by mouth two times a day  #14 x 0   Entered by:   Everitt Amber LPN   Authorized by:   Syliva Overman MD   Signed by:   Everitt Amber LPN on 22/06/5425   Method used:   Electronically to        [facility]  [address] (retail)       [address]       [place], [place]  [address]       Ph: [phone]       Fax: [phone]   RxID:   [id number] FLUCONAZOLE 100 MG TABS (FLUCONAZOLE) Take 1 tablet by mouth once a day as needed for  vaginal itch  #2 x 0   Entered and Authorized by:   Syliva Overman MD   Signed by:   Syliva Overman MD on 02/04/2010   Method used:   Electronically to        [facility] (retail)       [address]       [place], [place]  [address]       Ph: [phone]       Fax: [phone]   RxID:   [id number] ORTHO TRI-CYCLEN (28) 0.18/0.215/0.25 MG-35 MCG TABS (NORGESTIM-ETH ESTRAD TRIPHASIC) one tablet every day until February 27, 2010  #1 x 0   Entered and Authorized by:   Syliva Overman MD   Signed by:   Syliva Overman MD on 02/04/2010   Method used:   Electronically to        [facility] (retail)       [address] [facility]  [place], [place]  [address]       Ph: [phone]       Fax: [phone]   RxID:   [id number] SEPTRA DS 800-160 MG TABS (SULFAMETHOXAZOLE-TRIMETHOPRIM) Take 1 tablet by mouth two times a day  #14 x 0   Entered and Authorized by:   Syliva Overman MD   Signed by:   Syliva Overman MD on 02/04/2010   Method used:   Electronically to        [facility] (retail)       [address]       [place], [place]  [address]       Ph: [phone]       Fax: [phone]   RxID:   [id number]

## 2010-06-18 NOTE — Assessment & Plan Note (Signed)
Summary: DEPO  Nurse Visit   Allergies: 1)  ! * Rubbing Alcohol Laboratory Results   Urine Tests  Date/Time Received: Sep 19, 2009 4:28 PM  Date/Time Reported: Sep 19, 2009 4:28 PM     Urine HCG: negative    Medication Administration  Injection # 1:    Medication: Depo-Provera 150mg     Diagnosis: CONTRACEPTIVE MANAGEMENT (ICD-V25.09)    Route: IM    Site: RUOQ gluteus    Exp Date: 8/13    Lot #: [id number]    Mfr: [email or web address]    Patient tolerated injection without complications    Given by: Adella Hare LPN (Sep 20, 4538 4:27 PM)  Orders Added: 1)  Depo-Provera 150mg  [J1055] 2)  Admin of Therapeutic Inj  intramuscular or subcutaneous [96372] 3)  Urine Pregnancy Test  [81025]   Medication Administration  Injection # 1:    Medication: Depo-Provera 150mg     Diagnosis: CONTRACEPTIVE MANAGEMENT (ICD-V25.09)    Route: IM    Site: RUOQ gluteus    Exp Date: 8/13    Lot #: [id number]    Mfr: greenstone    Patient tolerated injection without complications    Given by: Adella Hare LPN (Sep 19, 9809 4:27 PM)  Orders Added: 1)  Depo-Provera 150mg  [J1055] 2)  Admin of Therapeutic Inj  intramuscular or subcutaneous [96372] 3)  Urine Pregnancy Test  [81025]   Laboratory Results   Urine Tests      Urine HCG: negative

## 2010-06-18 NOTE — Assessment & Plan Note (Signed)
Summary: F UP   Vital Signs:  Patient profile:   20 year old female Menstrual status:  depo with spotting Height:      64 inches Weight:      135.25 pounds BMI:     23.30 Pulse rate:   77 / minute Pulse rhythm:   regular Resp:     16 per minute BP sitting:   104 / 74 Cuff size:   regular  Vitals Entered ByEveritt Amber (June 13, 2009 8:26 AM) CC: Follow up chronic problems   Primary Care Provider:  Syliva Overman MD  CC:  Follow up chronic problems.  History of Present Illness: Pt continuses to have daily headaches at a 10, she saw neurology last week and has been started on topamax  and a narcotic pain med which provides her some relief. Her Mom is monitoring the use of the p[ain med. She has not worked since 01/21 needs extended work note, has f/u with neurology on 07/03/2009.  she c/o neck pain and stiffness for the past 1 month denies radiculopathy.  Allergies: 1)  ! * Rubbing Alcohol  Review of Systems      See HPI Eyes:  Denies blurring and discharge. ENT:  Denies hoarseness and nasal congestion. CV:  Denies chest pain or discomfort, palpitations, and swelling of feet. Resp:  Denies cough and sputum productive. GI:  Denies abdominal pain, constipation, diarrhea, nausea, and vomiting. GU:  Denies dysuria and urinary frequency. MS:  Complains of joint pain and stiffness; c/o neck painand spasm. Neuro:  Complains of headaches; denies seizures and sensation of room spinning; uncontrolled headaches persist, being followed by neurology. Endo:  Denies cold intolerance, excessive hunger, excessive thirst, excessive urination, heat intolerance, polyuria, and weight change. Heme:  Denies abnormal bruising and bleeding. Allergy:  Denies hives or rash.  Physical Exam  General:  Well-developed,well-nourished,in no acute distress; alert,appropriate and cooperative throughout examination HEENT: No facial asymmetry,  EOMI, No sinus tenderness, TM's Clear, oropharynx   pink and moist. neck: decreased ROM with trapezius spasm  Chest: Clear to auscultation bilaterally.  CVS: S1, S2, No murmurs, No S3.   Abd: Soft, Nontender.  MS: Adequate ROM spine, hips, shoulders and knees.  Ext: No edema.   CNS: CN 2-12 intact, power tone and sensation normal throughout.   Skin: Intact, no visible lesions or rashes.  Psych: Good eye contact, normal affect.  Memory intact, not anxious or depressed appearing.    Impression & Recommendations:  Problem # 1:  NECK PAIN (ICD-723.1) Assessment Deteriorated  Her updated medication list for this problem includes:    Fioricet 50-325-40 Mg Tabs (Butalbital-apap-caffeine) ..... One tablet every 6 hours as needed for severe headache    Ibuprofen 800 Mg Tabs (Ibuprofen) .Marland Kitchen... Take 1 tablet by mouth three times a day    Zanaflex 4 Mg Caps (Tizanidine hcl) .Marland Kitchen... Take 1 capsule by mouth three times a day   as needed  Orders: Physical Therapy Referral (PT)  Problem # 2:  HEADACHE (ICD-784.0) Assessment: Unchanged  Her updated medication list for this problem includes:    Fioricet 50-325-40 Mg Tabs (Butalbital-apap-caffeine) ..... One tablet every 6 hours as needed for severe headache    Ibuprofen 800 Mg Tabs (Ibuprofen) .Marland Kitchen... Take 1 tablet by mouth three times a day  Problem # 3:  BIPOLAR DISORDER UNSPECIFIED (ICD-296.80) Assessment: Improved  Problem # 4:  FAMILY PLANNING (ICD-V25.09) Assessment: Comment Only  Orders: Depo-Provera 150mg  (J1055) Admin of Therapeutic Inj  intramuscular or subcutaneous (  [address])  Complete Medication List: 1)  Depakote 500 Mg Tbec (Divalproex sodium) .... Two tabs by mouth in the morning and one at night 2)  Topamax 25 Mg Tabs (Topiramate) .... Take 1 tablet by mouth two times a day 3)  Fioricet 50-325-40 Mg Tabs (Butalbital-apap-caffeine) .... One tablet every 6 hours as needed for severe headache 4)  Promethazine Hcl 12.5 Mg Tabs (Promethazine hcl) .... Take 1 tablet by mouth two times a  day as needed 5)  Loratadine 10 Mg Tabs (Loratadine) .... One tab by mouth qd 6)  Ibuprofen 800 Mg Tabs (Ibuprofen) .... Take 1 tablet by mouth three times a day 7)  Zanaflex 4 Mg Caps (Tizanidine hcl) .... Take 1 capsule by mouth three times a day   as needed  Patient Instructions: 1)  Please schedule a follow-up appointment in 1 month  to 6 weeks.Marland Kitchen 2)  It is important that you exercise regularly at least 20 minutes 5 times a week. If you develop chest pain, have severe difficulty breathing, or feel very tired , stop exercising immediately and seek medical attention. 3)  You need to lose weight. Consider a lower calorie diet and regular exercise.  4)  You will be started on anti-inflammatories and muscle relaxants for neck pain and spasm and you will also be referred for physical therapy. 5)  Depo for contraception Prescriptions: ZANAFLEX 4 MG CAPS (TIZANIDINE HCL) Take 1 capsule by mouth three times a day   as needed  #30 x 0   Entered and Authorized by:   Syliva Overman MD   Signed by:   Syliva Overman MD on 06/13/2009   Method used:   Electronically to        [facility] (retail)       [address]       [place], [place]  [address]       Ph: [phone]       Fax: [phone]   RxID:   [id number] IBUPROFEN 800 MG TABS (IBUPROFEN) Take 1 tablet by mouth three times a day  #30 x 0   Entered and Authorized by:   Syliva Overman MD   Signed by:   Syliva Overman MD on 06/13/2009   Method used:   Electronically to        [facility] (retail)       [address]       [place], [place]  [address]       Ph: [phone]       Fax: [phone]   RxID:   [id number]    Medication Administration  Injection # 1:    Medication: Depo-Provera 150mg     Diagnosis: FAMILY PLANNING (ICD-V25.09)    Route: IM    Site: RUOQ gluteus    Exp Date: 12/2011    Lot #: [id number]    Mfr: [email or web address]    Comments: 150 mg given      Patient tolerated injection without complications    Given by: Everitt Amber (June 13, 2009 11:24 AM)  Orders Added: 1)  Est. Patient Level IV [606[address]] 2)  Physical Therapy Referral [PT] 3)  Depo-Provera 150mg  [J1055] 4)  Admin of Therapeutic Inj  intramuscular or subcutaneous [96372] Next one due 09/05/2009

## 2010-06-18 NOTE — Progress Notes (Signed)
Summary: RX  Phone Note Call from Patient   Summary of Call: WENT TO ER AND THEY GAVE HER SOME ONDANSEPRONE AND IT IS MAKING HER DIZZY AND THROWING UP WANTS TO KNOW CAN YOU CALL IN SOME PHENERGAN TO [facility] [place] Initial call taken by: Lind Guest,  February 19, 2010 11:23 AM  Follow-up for Phone Call        pls see if pregnant, all meds from ob, earlier msg states she needs to see one asap Follow-up by: Syliva Overman MD,  February 19, 2010 12:30 PM  Additional Follow-up for Phone Call Additional follow up Details #1::        ok to send in phenergan, I see she is not pregnant Additional Follow-up by: Syliva Overman MD,  February 19, 2010 7:01 PM    Additional Follow-up for Phone Call Additional follow up Details #2::    med sent in  Follow-up by: Everitt Amber LPN,  February 20, 2010 9:55 AM  New/Updated Medications: PROMETHAZINE HCL 12.5 MG TABS (PROMETHAZINE HCL) Take 1 tablet by mouth two times a day as needed Prescriptions: PROMETHAZINE HCL 12.5 MG TABS (PROMETHAZINE HCL) Take 1 tablet by mouth two times a day as needed  #12 x 0   Entered and Authorized by:   Syliva Overman MD   Signed by:   Syliva Overman MD on 02/19/2010   Method used:   Printed then faxed to ...       [facility] ([facility]ail)       [address]       [place], [place]  [address]       Ph: [phone]       Fax: [phone]   RxID:   [id number]

## 2010-06-18 NOTE — Progress Notes (Signed)
Summary: ENT  ENT   Imported By: Lind Guest 05/28/2009 09:25:26  _____________________________________________________________________  External Attachment:    Type:   Image     Comment:   External Document

## 2010-06-18 NOTE — Progress Notes (Signed)
  Phone Note From Pharmacy   Caller: [facility] Summary of Call: singulair requires pa do you want a substitute? Initial call taken by: Worthy Keeler LPN,  June 11, 2009 9:54 AM  Follow-up for Phone Call        call pt ensure she has not failed zyrtec or claritin , if she has then can pA , if not let her know we are sending in generic claritin, loratidine 10mg  one daily #30 refill 3, pls send in Follow-up by: Syliva Overman MD,  June 11, 2009 5:49 PM  Additional Follow-up for Phone Call Additional follow up Details #1::        CALLED PATIENT, NO ANSWER SENT RX Additional Follow-up by: Worthy Keeler LPN,  June 12, 2009 2:44 PM    New/Updated Medications: LORATADINE 10 MG TABS (LORATADINE) ONE TAB by mouth QD Prescriptions: LORATADINE 10 MG TABS (LORATADINE) ONE TAB by mouth QD  #30 x 2   Entered by:   Worthy Keeler LPN   Authorized by:   Syliva Overman MD   Signed by:   Worthy Keeler LPN on 41/32/4401   Method used:   Electronically to        [facility] [address] (ret[facility])       [address]       [place], [place]  [address]       Ph: [phone] or [phone]       Fax: [phone]   RxID:   [id number]

## 2010-06-18 NOTE — Progress Notes (Signed)
Summary: referral  Phone Note Call from Patient   Summary of Call: pt would like to be referred to neuro for headaches. is this okay? [phone] Initial call taken by: Rudene Anda,  August 06, 2009 1:25 PM  Follow-up for Phone Call        she is already seeing dr Gerilyn Pilgrim and recently both she and her mother stated things were better  as far as the headaches so i do not understand Follow-up by: Syliva Overman MD,  August 08, 2009 7:58 PM  Additional Follow-up for Phone Call Additional follow up Details #1::        left message for pt to call back Additional Follow-up by: Rudene Anda,  August 09, 2009 8:22 AM    Additional Follow-up for Phone Call Additional follow up Details #2::    pt states she doesn't really like [place] and all he does is give her meds. headaches where getting  better but now coming back. she doesn't want to be on all those meds dr. Gerilyn Pilgrim gives her.  Follow-up by: Rudene Anda,  August 09, 2009 2:42 PM  Additional Follow-up for Phone Call Additional follow up Details #3:: Details for Additional Follow-up Action Taken: per dr. Lodema Hong its okay to refer her out to another neuro. I spoke with doc about this and she said to go ahead.  Additional Follow-up by: Rudene Anda,  August 09, 2009 2:45 PM

## 2010-06-18 NOTE — Progress Notes (Signed)
Summary: [facility]   Imported By: Lind Guest 06/13/2009 15:03:15  _____________________________________________________________________  External Attachment:    Type:   Image     Comment:   External Document

## 2010-06-18 NOTE — Assessment & Plan Note (Signed)
Summary: office visit   Vital Signs:  Patient profile:   20 year old female Menstrual status:  depo with spotting Height:      64 inches Weight:      132.75 pounds BMI:     22.87 Pulse rate:   86 / minute Pulse rhythm:   regular Resp:     16 per minute BP sitting:   110 / 80  (left arm)  Vitals Entered By: Everitt Amber (May 24, 2009 1:34 PM) CC: has had a migraine since yesterday, sensitive to light and sound, nausea and vomiting   Primary Care Provider:  Syliva Overman MD  CC:  has had a migraine since yesterday, sensitive to light and sound, and nausea and vomiting.  History of Present Illness: 2 day h/o frontal throbbing with nausea, goes behind right eye, states that her headaches are becoming more frequent and severe, and are not responnding to meds that are being used. She actually stopped the topamax, states it made her "feel funny" She has an upcoming appt with the neurologist.  Allergies: 1)  ! * Rubbing Alcohol  Review of Systems      See HPI General:  Complains of fatigue; denies chills and fever. Eyes:  photophobia with the headache. ENT:  Denies hoarseness, nasal congestion, sinus pressure, and sore throat. CV:  Denies chest pain or discomfort, palpitations, and swelling of feet. Resp:  Denies cough and sputum productive. GI:  Complains of nausea and vomiting; denies abdominal pain, constipation, and diarrhea. GU:  Denies dysuria and urinary frequency. MS:  Denies joint pain and stiffness. Neuro:  Complains of headaches; denies poor balance, seizures, and sensation of room spinning. Psych:  Complains of mental problems.  Physical Exam  General:  Well-developed,well-nourished,in no acute distress; alert,appropriate and cooperative throughout examination. Pt holding her head in pain in a darkened room. HEENT: No facial asymmetry,  EOMI, No sinus tenderness, TM's Clear, oropharynx  pink and moist.   Chest: Clear to auscultation bilaterally.  CVS: S1,  S2, No murmurs, No S3.   Abd: Soft, Nontender.  MS: Adequate ROM spine, hips, shoulders and knees.  Ext: No edema.   CNS: CN 2-12 intact, power tone and sensation normal throughout. normal fundoscopy.  Skin: Intact, no visible lesions or rashes.  Psych: Good eye contact, normal affect.  Memory intact, not anxious or depressed appearing.    Impression & Recommendations:  Problem # 1:  NAUSEA AND VOMITING (ICD-787.01) Assessment Comment Only  Orders: Zofran 1mg . injection (U2725)  Problem # 2:  HEADACHE (ICD-784.0) Assessment: Deteriorated  Her updated medication list for this problem includes:    Fioricet 50-325-40 Mg Tabs (Butalbital-apap-caffeine) ..... One tablet every 6 hours as needed for severe headache  Orders: Radiology Referral (Radiology) Depo- Medrol 80mg  (J1040) Ketorolac-Toradol 15mg  (D6644) Admin of Therapeutic Inj  intramuscular or subcutaneous (03474) Neurology Referral (Neuro)  Problem # 3:  NICOTINE ADDICTION (ICD-305.1) Assessment: Unchanged  Encouraged smoking cessation and discussed different methods for smoking cessation.   Problem # 4:  BIPOLAR DISORDER UNSPECIFIED (ICD-296.80) Assessment: Improved  Complete Medication List: 1)  Depakote 500 Mg Tbec (Divalproex sodium) .... Two tabs by mouth in the morning and one at night 2)  Singulair 10 Mg Tabs (Montelukast sodium) .... Take 1 tablet by mouth once a day 3)  Topamax 25 Mg Tabs (Topiramate) .... Take 1 tablet by mouth two times a day 4)  Fioricet 50-325-40 Mg Tabs (Butalbital-apap-caffeine) .... One tablet every 6 hours as needed for severe headache  5)  Promethazine Hcl 12.5 Mg Tabs (Promethazine hcl) .... Take 1 tablet by mouth two times a day as needed  Patient Instructions: 1)  f/u as before. 2)  you are being treated for migraine. I will refer you for an MRI of your brain, and alsop to the neurologist. Injections in the office today and meds to your pharmacy. Prescriptions: PROMETHAZINE HCL  12.5 MG TABS (PROMETHAZINE HCL) Take 1 tablet by mouth two times a day as needed  #20 x 0   Entered and Authorized by:   Syliva Overman MD   Signed by:   Syliva Overman MD on 05/24/2009   Method used:   [facility]        [facility] [address]       [address]       [place], [place]  [address]       Ph: [phone] or [phone]       Fax: [phone]   RxID:   [id number] FIORICET 50-325-40 MG TABS [facility]) one tablet every 6 hours as needed for severe headache  #40 x 0   Entered and Authorized by:   Syliva Overman MD   Signed by:   Syliva Overman MD on 05/24/2009   Method used:   [facility]        [facility] [address]       [address]       [place], [place]  [address]       Ph: [phone] or [phone]       Fax: [phone]   RxID:   [id number]    Medication Administration  Injection # 1:    Medication: Depo- Medrol 80mg     Diagnosis: HEADACHE (ICD-784.0)    Route: IM    Site: RUOQ gluteus    Exp Date: 10/11    Lot #: [id number]    Mfr: Pharmacia    Patient tolerated injection without complications    Given by: Worthy Keeler LPN (May 24, 2009 2:29 PM)  Injection # 2:    Medication: Ketorolac-Toradol 15mg     Diagnosis: HEADACHE (ICD-784.0)    Route: IM    Site: RUOQ gluteus    Exp Date: 12/18/2010    Lot #: [id number]    Mfr: [email or web address]    Comments: toradol 60mg  given    Patient tolerated injection without complications    Given by: Worthy Keeler LPN (May 24, 2009 2:30 PM)  Injection # 3:    Medication: Zofran 1mg . injection    Diagnosis: NAUSEA AND VOMITING (ICD-787.01)    Route: IM    Site: LUOQ gluteus    Exp Date: 6/11    Lot #: [id number]    Mfr: [email or web address]    Comments: zofran 4mg  given    Patient tolerated injection without complications    Given by: Worthy Keeler LPN (May 24, 2009 2:31 PM)  Orders Added: 1)  Est. Patient Level  III [78938] 2)  Radiology Referral [Radiology] 3)  Depo- Medrol 80mg  [J1040] 4)  Ketorolac-Toradol 15mg  [J1885] 5)  Zofran 1mg . injection [J2405] 6)  Admin of Therapeutic Inj  intramuscular or subcutaneous [96372] 7)  Neurology Referral [Neuro]

## 2010-06-18 NOTE — Progress Notes (Signed)
Summary: NOTE EXTENED  Phone Note Call from Patient   Summary of Call: WANTS HER OUT OF WORK NOTE Marie Brown [facility] CALL BACK AT [phone] Initial call taken by: Lind Guest,  July 05, 2009 3:37 PM  Follow-up for Phone Call        pls extend note till 07/09/2009, advise if needs further extensuion shewillneed this from Dr. Gerilyn Pilgrim who is treating her headache Follow-up by: Syliva Overman MD,  July 05, 2009 5:15 PM  Additional Follow-up for Phone Call Additional follow up Details #1::        patient aware Additional Follow-up by: Adella Hare LPN,  July 06, 2009 4:13 PM

## 2010-06-18 NOTE — Assessment & Plan Note (Signed)
Summary: physical   Vital Signs:  Patient profile:   20 year old female Menstrual status:  depo with spotting Height:      64 inches Weight:      136.50 pounds BMI:     23.51 Temp:     98.1 degrees F oral Pulse rate:   69 / minute Pulse rhythm:   regular Resp:     16 per minute BP sitting:   120 / 70  (left arm)  Vitals Entered By: Worthy Keeler LPN (January 17, 2009 8:16 AM) CC: physical Is Patient Diabetic? No Pain Assessment Patient in pain? no       Vision Screening:Left eye w/o correction: 20 / 40 Right Eye w/o correction: 20 / 70 Both eyes w/o correction:  20/ 40        Vision Entered By: Worthy Keeler LPN (January 17, 2009 8:25 AM)   CC:  physical.  History of Present Illness: Patient reports doing well.  Denies any recent fever or chills.  Denies any appetite change or change in bowel movements. Patient denies depression, anxiety or insomnia. she has a 1 week h/olow back and flamk pain withdysuria and frequency , also malododors vag d/c .  Preventive Screening-Counseling & Management  Alcohol-Tobacco     Smoking Cessation Counseling: yes  Current Medications (verified): 1)  Depakote 500 Mg  Tbec (Divalproex Sodium) .... Two Tabs By Mouth in The Morning and One At Night 2)  Singulair 10 Mg Tabs (Montelukast Sodium) .... Take 1 Tablet By Mouth Once A Day 3)  Proventil Hfa 108 (90 Base) Mcg/act Aers (Albuterol Sulfate) .... 2 Puffs Every 6 To 8 Hrs. As Needed  Allergies (verified): 1)  ! * Rubbing Alcohol  Review of Systems General:  Denies chills and fever. Eyes:  Complains of blurring and vision loss-both eyes; needs opthal appt. ENT:  Denies hoarseness, nasal congestion, sinus pressure, and sore throat. CV:  Denies chest pain or discomfort and palpitations. Resp:  Denies cough and sputum productive. GI:  Denies abdominal pain, constipation, diarrhea, nausea, and vomiting. GU:  Complains of discharge, dysuria, and urinary frequency; 1 week  history. MS:  Complains of low back pain; denies joint pain and stiffness; 1week. Derm:  Denies itching, lesion(s), and rash. Neuro:  Denies headaches, seizures, and sensation of room spinning. Psych:  Complains of mental problems; denies suicidal thoughts/plans, thoughts of violence, and unusual visions or sounds. Endo:  Denies cold intolerance, excessive hunger, excessive thirst, excessive urination, heat intolerance, polyuria, and weight change. Heme:  Denies abnormal bruising and enlarge lymph nodes. Allergy:  Denies hives or rash and itching eyes.  Physical Exam  General:  alert, well-nourished, and well-hydrated.   Head:  Normocephalic and atraumatic without obvious abnormalities. No apparent alopecia or balding. Eyes:  vision grossly intact, pupils equal, pupils round, and pupils reactive to light.   Ears:  External ear exam shows no significant lesions or deformities.  Otoscopic examination reveals clear canals, tympanic membranes are intact bilaterally without bulging, retraction, inflammation or discharge. Hearing is grossly normal bilaterally. Nose:  External nasal examination shows no deformity or inflammation. Nasal mucosa are pink and moist without lesions or exudates. Mouth:  Oral mucosa and oropharynx without lesions or exudates.  Teeth in good repair. Neck:  No deformities, masses, or tenderness noted. Chest Wall:  No deformities, masses, or tenderness noted. Breasts:  No mass, nodules, thickening, tenderness, bulging, retraction, inflamation, nipple discharge or skin changes noted.   Lungs:  Normal respiratory effort,  chest expands symmetrically. Lungs are clear to auscultation, no crackles or wheezes. Heart:  Normal rate and regular rhythm. S1 and S2 normal without gallop, murmur, click, rub or other extra sounds. Abdomen:  Bowel sounds positive,abdomen soft and non-tender without masses, organomegaly or hernias noted. Genitalia:  no external lesions, normal uterus size and  position, no adnexal masses or tenderness, and vaginal discharge.   Msk:  No deformity or scoliosis noted of thoracic or lumbar spine.   Pulses:  R and L carotid,radial,femoral,dorsalis pedis and posterior tibial pulses are full and equal bilaterally Extremities:  No clubbing, cyanosis, edema, or deformity noted with normal full range of motion of all joints.   Neurologic:  No cranial nerve deficits noted. Station and gait are normal. Plantar reflexes are down-going bilaterally. DTRs are symmetrical throughout. Sensory, motor and coordinative functions appear intact. Skin:  Intact without suspicious lesions or rashes Cervical Nodes:  No lymphadenopathy noted Axillary Nodes:  No palpable lymphadenopathy Inguinal Nodes:  No significant adenopathy Psych:  Cognition and judgment appear intact. Alert and cooperative with normal attention span and concentration. No apparent delusions, illusions, hallucinations   Impression & Recommendations:  Problem # 1:  NICOTINE ADDICTION (ICD-305.1) Assessment Unchanged  Encouraged smoking cessation and discussed different methods for smoking cessation.   Problem # 2:  BIPOLAR DISORDER UNSPECIFIED (ICD-296.80) Assessment: Improved currently in trainingfor work at a call cter for the [employer]  Problem # 3:  UNSPECIFIED VAGINITIS AND VULVOVAGINITIS (ICD-616.10) Assessment: Comment Only  wet prep and culture sent  Orders: T-Wet Prep by Molecular Probe [id number]) T-Chlamydia & GC Probe, Genital ([id number])  Complete Medication List: 1)  Depakote 500 Mg Tbec (Divalproex sodium) .... Two tabs by mouth in the morning and one at night 2)  Singulair 10 Mg Tabs (Montelukast sodium) .... Take 1 tablet by mouth once a day 3)  Proventil Hfa 108 (90 Base) Mcg/act Aers (Albuterol sulfate) .... 2 puffs every 6 to 8 hrs. as needed  Other Orders: T-Basic Metabolic Panel [id number]) T-Lipid Profile [id number]) T-CBC No Diff ([id number]) HPV  Vaccine - 3 sched doses - IM (44010) Admin 1st Vaccine (27253) Admin 1st Vaccine [facility]) [phone]) UA Dipstick W/ Micro (manual) (81000) Pap Smear (47425) Ophthalmology Referral (Ophthalmology)  Patient Instructions: 1)  F/u March 21, 2009.mD visit 2)  It is important that you exercise regularly at least 20 minutes 5 times a week. If you develop chest pain, have severe difficulty breathing, or feel very tired , stop exercising immediately and seek medical attention. 3)  Tobacco is very bad for your health and your loved ones! You Should stop smoking!. 4)  Stop Smoking Tips: Choose a Quit date. Cut down before the Quit date. decide what you will do as a substitute when you feel the urge to smoke(gum,toothpick,exercise).       HPV # 3    Vaccine Type: Gardasil    Site: right deltoid    Mfr: Merck    Dose: 0.5 ml    Route: IM    Given by: Everitt Amber    Exp. Date: 01/29/2011    Lot #: [id number]   Laboratory Results   Urine Tests  Date/Time Received: January 17, 2009  Date/Time Reported: January 17, 2009   Routine Urinalysis   Color: lt. yellow Appearance: Clear Glucose: negative   (Normal Range: Negative) Bilirubin: negative   (Normal Range: Negative) Ketone: negative   (Normal Range: Negative) Spec. Gravity: 1.025   (Normal Range: 1.003-1.035) Blood: trace-intact   (Normal  Range: Negative) pH: 6.5   (Normal Range: 5.0-8.0) Protein: negative   (Normal Range: Negative) Urobilinogen: 0.2   (Normal Range: 0-1) Nitrite: negative   (Normal Range: Negative) Leukocyte Esterace: negative   (Normal Range: Negative)

## 2010-06-18 NOTE — Progress Notes (Signed)
Summary: ringworn  Phone Note Call from Patient   Summary of Call: has a ing worm on her arm would you call something in at [facility] call back at [phone] Initial call taken by: Lind Guest,  February 07, 2010 3:38 PM  Follow-up for Phone Call        med sent in, let her know pls Follow-up by: Syliva Overman MD,  February 07, 2010 5:15 PM  Additional Follow-up for Phone Call Additional follow up Details #1::        called patient, left message Additional Follow-up by: Adella Hare LPN,  February 08, 2010 10:08 AM    New/Updated Medications: CLOTRIMAZOLE-BETAMETHASONE 1-0.05 % CREA (CLOTRIMAZOLE-BETAMETHASONE) apply to affected area twice daily for 2 weeks, then as  needed Prescriptions: CLOTRIMAZOLE-BETAMETHASONE 1-0.05 % CREA (CLOTRIMAZOLE-BETAMETHASONE) apply to affected area twice daily for 2 weeks, then as  needed  #45gm x 1   Entered and Authorized by:   Syliva Overman MD   Signed by:   Syliva Overman MD on 02/07/2010   Method used:   Electronically to        [facility]  [address] (retail)       [address]       [place], [place]  [address]       Ph: [phone]       Fax: [phone]   RxID:   [id number]   Appended Document: ringworn patient aware

## 2010-06-18 NOTE — Progress Notes (Signed)
Summary: refill  Phone Note Call from Patient   Summary of Call: pt needs a refill on inhaler [phone] please call pt when faxed over. Initial call taken by: Rudene Anda,  November 14, 2009 1:57 PM  Follow-up for Phone Call        med sent but aware no further refills without ov Follow-up by: Adella Hare LPN,  November 14, 2009 4:50 PM    New/Updated Medications: PROVENTIL HFA 108 (90 BASE) MCG/ACT AERS (ALBUTEROL SULFATE) two puffs every six to eight hours as needed Prescriptions: PROVENTIL HFA 108 (90 BASE) MCG/ACT AERS (ALBUTEROL SULFATE) two puffs every six to eight hours as needed  #1 x 0   Entered by:   Adella Hare LPN   Authorized by:   Syliva Overman MD   Signed by:   Adella Hare LPN on 62/13/0865   Method used:   Electronically to        [facility] (retail)       [address]       [place], [place]  [address]       Ph: [phone]       Fax: [phone]   RxID:   [id number]

## 2010-06-18 NOTE — Progress Notes (Signed)
Summary: NEEDS OBGYN  Phone Note Call from Patient   Summary of Call: WENT TO THE ED AND SHE NEEDS A REF. TO GO TO A OBGYN AND SHE WANTS DR. Vilma Meckel IN [place] ASAP. CALL BACK AT [phone] Initial call taken by: Lind Guest,  February 19, 2010 8:04 AM  Follow-up for Phone Call        pls ask pt if pregnant if she is refer to ob of her choice, if not pregnant why does she need to go Follow-up by: Syliva Overman MD,  February 19, 2010 12:30 PM  Additional Follow-up for Phone Call Additional follow up Details #1::        pt isn't pregnant. she went to ED over weekend and had fibroid tumors. So i referred her to dr. Ralph Dowdy in [place]. they will call with appt and time. pt aware Additional Follow-up by: Rudene Anda,  February 19, 2010 3:59 PM

## 2010-06-18 NOTE — Letter (Signed)
Summary: Out of Work  [facility]  [address]   [place], [place] [address]   Phone: [phone]  Fax: [phone]    July 06, 2009   Employee:  Marie Brown    To Whom It May Concern:   For Medical reasons, please excuse the above named employee from work for the following dates:  Start:   06/08/09  End:   07/09/09 to return with no restrictions  If you need additional information, please feel free to contact our office.         Sincerely,    Milus Mallick. Lodema Hong, MD

## 2010-06-18 NOTE — Progress Notes (Signed)
Summary: Office Visit  Office Visit   Imported By: Lind Guest 05/15/2009 11:07:57  _____________________________________________________________________  External Attachment:    Type:   Image     Comment:   External Document

## 2010-06-18 NOTE — Assessment & Plan Note (Signed)
Summary: F UP   Vital Signs:  Patient profile:   20 year old female Menstrual status:  depo with spotting Height:      64 inches Weight:      137.50 pounds BMI:     23.69 Pulse rate:   71 / minute Pulse rhythm:   regular Resp:     16 per minute BP sitting:   120 / 74  (left arm) Cuff size:   regular  Vitals Entered By: Everitt Amber LPN (July 26, 9526 1:06 PM) CC: Follow up, still having some problems with her neck but it is improving since PT and she has occasional headaches but the meds seem to be helping   Primary Care Provider:  Syliva Overman MD  CC:  Follow up and still having some problems with her neck but it is improving since PT and she has occasional headaches but the meds seem to be helping.  History of Present Illness: Reports  that she is doing much better, her headaches are less frequent and less severe, and they do respond to medications prescribed. Denies recent fever or chills. Denies sinus pressure, nasal congestion , ear pain or sore throat. Denies chest congestion, or cough productive of sputum. Denies chest pain, palpitations, PND, orthopnea or leg swelling. Denies abdominal pain, nausea, vomitting, diarrhea or constipation. Denies change in bowel movements or bloody stool. Denies dysuria , frequency, incontinence or hesitancy. Her neck pain is improved since starting physical therapy also Denies vertigo, seizures. Denies depression, anxiety or insomnia.She is followed by psych, but due to scheduling conflicts, reports being unable to keep appts as se aould like, a request for early morning appts was written on her behalf during the Ov  Denies  rash, lesions, or itch.     Preventive Screening-Counseling & Management  Alcohol-Tobacco     Smoking Cessation Counseling: yes  Current Medications (verified): 1)  Depakote 500 Mg  Tbec (Divalproex Sodium) .... Two Tabs By Mouth in The Morning and One At Night 2)  Topamax 25 Mg Tabs (Topiramate) .... Take  1 Tablet By Mouth Two Times A Day 3)  Ultram 50 Mg Tabs (Tramadol Hcl) .... 2 Tabs Every 6 Hrs As Needed 4)  Diclofenac Potassium 50 Mg Tabs (Diclofenac Potassium) .... Take 1 Tablet By Mouth Three Times A Day 5)  Imipramine Hcl 25 Mg Tabs (Imipramine Hcl) .... 2 Tabs At Bedtime  Allergies (verified): 1)  ! * Rubbing Alcohol  Review of Systems General:  Denies chills, fatigue, and fever. Eyes:  Denies blurring and discharge. Derm:  Denies itching, lesion(s), and rash. Neuro:  Complains of headaches; marked improvement in severity and frequency per pt and her mother. Psych:  Complains of mental problems; denies suicidal thoughts/plans, thoughts of violence, and unusual visions or sounds. Heme:  Denies abnormal bruising and bleeding. Allergy:  Complains of seasonal allergies; denies hives or rash and itching eyes.  Physical Exam  General:  Well-developed,well-nourished,in no acute distress; alert,appropriate and cooperative throughout examination HEENT: No facial asymmetry,  EOMI, No sinus tenderness, TM's Clear, oropharynx  pink and moist. neck: decreased ROM with trapezius spasm  Chest: Clear to auscultation bilaterally.  CVS: S1, S2, No murmurs, No S3.   Abd: Soft, Nontender.  MS: Adequate ROM spine, hips, shoulders and knees.  Ext: No edema.   CNS: CN 2-12 intact, power tone and sensation normal throughout.   Skin: Intact, no visible lesions or rashes.  Psych: Good eye contact, normal affect.  Memory intact, not anxious  or depressed appearing.    Impression & Recommendations:  Problem # 1:  WEIGHT GAIN (ICD-783.1) Assessment Unchanged pt encouraged to start regular exercise and reduce caloric intake to facilitate weight loss.  Problem # 2:  NICOTINE ADDICTION (ICD-305.1) Assessment: Improved  Encouraged smoking cessation and discussed different methods for smoking cessation.   Problem # 3:  BIPOLAR DISORDER UNSPECIFIED (ICD-296.80) Assessment: Improved pt currently  treated by psychiatry  Problem # 4:  HEADACHE (ICD-784.0) Assessment: Improved  The following medications were removed from the medication list:    Fioricet 50-325-40 Mg Tabs (Butalbital-apap-caffeine) ..... One tablet every 6 hours as needed for severe headache    Ibuprofen 800 Mg Tabs (Ibuprofen) .Marland Kitchen... Take 1 tablet by mouth three times a day Her updated medication list for this problem includes:    Ultram 50 Mg Tabs (Tramadol hcl) .Marland Kitchen... 2 tabs every 6 hrs as needed    Diclofenac Potassium 50 Mg Tabs (Diclofenac potassium) .Marland Kitchen... Take 1 tablet by mouth three times a day reports improved control with current med regime, denies any recent headaches  Complete Medication List: 1)  Depakote 500 Mg Tbec (Divalproex sodium) .... Two tabs by mouth in the morning and one at night 2)  Topamax 25 Mg Tabs (Topiramate) .... Take 1 tablet by mouth two times a day 3)  Ultram 50 Mg Tabs (Tramadol hcl) .... 2 tabs every 6 hrs as needed 4)  Diclofenac Potassium 50 Mg Tabs (Diclofenac potassium) .... Take 1 tablet by mouth three times a day 5)  Imipramine Hcl 25 Mg Tabs (Imipramine hcl) .... 2 tabs at bedtime 6)  Fluconazole 150 Mg Tabs (Fluconazole) .... Take 1 tablet by mouth once a day  Patient Instructions: 1)  F/U visit need to be set for 12 weeks after January 26 exactly for depoprovera , nurse visit only. 2)  mD f/u  Mid to end June. 3)  Tobacco is very bad for your health and your loved ones! You Should stop smoking!. 4)  Stop Smoking Tips: Choose a Quit date. Cut down before the Quit date. decide what you will do as a substitute when you feel the urge to smoke(gum,toothpick,exercise). 5)  It is important that you exercise regularly at least 20 minutes 5 times a week. If you develop chest pain, have severe difficulty breathing, or feel very tired , stop exercising immediately and seek medical attention. 6)  You need to lose weight. Consider a lower calorie diet and regular exercise.   Prescriptions: FLUCONAZOLE 150 MG TABS (FLUCONAZOLE) Take 1 tablet by mouth once a day  #5 x 0   Entered and Authorized by:   Syliva Overman MD   Signed by:   Syliva Overman MD on 07/25/2009   Method used:   Electronically to        [facility] (retail)       [address]       [place], [place]  [address]       Ph: [phone]       Fax: [phone]   RxID:   [id number]

## 2010-06-18 NOTE — Progress Notes (Signed)
Summary: refill  Phone Note Call from Patient   Summary of Call: pt [facility] phengren [facility] not there. [phone] Initial call taken by: Rudene Anda,  February 20, 2010 2:35 PM    New/Updated Medications: PROMETHAZINE HCL 12.5 MG TABS (PROMETHAZINE HCL) Take 1 tablet by mouth two times a day Prescriptions: PROMETHAZINE HCL 12.5 MG TABS (PROMETHAZINE HCL) Take 1 tablet by mouth two times a day  #12 x 0   Entered by:   Everitt Amber LPN   Authorized by:   Syliva Overman MD   Signed by:   Everitt Amber LPN on 13/12/6576   Method used:   Electronically to        [facility]  [address] (retail)       [address]       [place], [place]  [address]       Ph: [phone]       Fax: [phone]   RxID:   [id number]

## 2010-06-18 NOTE — Assessment & Plan Note (Signed)
Summary: DEPO  Nurse Visit   Vital Signs:  Patient profile:   20 year old female Menstrual status:  depo with spotting O2 Sat:      96 % Pulse rate:   72 / minute Resp:     16 per minute  Vitals Entered By: Everitt Amber (Oct 04, 2008 3:09 PM)  CC: patient in to get depo provera injection    Allergies: 1)  ! * Rubbing Alcohol     Medication Administration  Injection # 1:    Medication: Depo-Provera 150mg     Diagnosis: FAMILY PLANNING (ICD-V25.09)    Route: IM    Site: RUOQ gluteus    Exp Date: 4/11    Lot #: [id number]    Mfr: sicor    Comments: 150 mg given    Patient tolerated injection without complications    Given by: Everitt Amber (Oct 04, 2008 3:10 PM)  Orders Added: 1)  Depo-Provera 150mg  [J1055] 2)  Admin of Therapeutic Inj  intramuscular or subcutaneous [96372]      Medication Administration  Injection # 1:    Medication: Depo-Provera 150mg     Diagnosis: FAMILY PLANNING (ICD-V25.09)    Route: IM    Site: RUOQ gluteus    Exp Date: 4/11    Lot #: [id number]    Mfr: sicor    Comments: 150 mg given    Patient tolerated injection without complications    Given by: Everitt Amber (Oct 04, 2008 3:10 PM)  Orders Added: 1)  Depo-Provera 150mg  [J1055] 2)  Admin of Therapeutic Inj  intramuscular or subcutaneous [96372]  Appended Document: DEPO    Clinical Lists Changes  Orders: Added new Service order of Urine Pregnancy Test  [phone]) - Signed Observations: Added new observation of PREG TST URN: negative (10/04/2008 15:16)      Laboratory Results   Urine Tests  Date/Time Received: Oct 04, 2008 3pm Date/Time Reported: Oct 04, 2008 3pm    Urine HCG: negative

## 2010-06-18 NOTE — Letter (Signed)
Summary: Out of Work  [facility]  [address]   [place], [place] [address]   Phone: [phone]  Fax: [phone]    November 08, 2008   Employee:  AYDEN APODACA    To Whom It May Concern:   For Medical reasons, please excuse the above named employee from work for the following dates:  Start:   11/08/2008  End:   11/09/2008  If you need additional information, please feel free to contact our office.         Sincerely,    Everitt Amber

## 2010-06-18 NOTE — Assessment & Plan Note (Signed)
Summary: ov   Vital Signs:  Patient profile:   20 year old female Menstrual status:  depo with spotting Height:      64 inches Weight:      140.75 pounds BMI:     24.25 Pulse rate:   70 / minute Pulse rhythm:   regular Resp:     16 per minute BP sitting:   108 / 78 Cuff size:   regular  Vitals Entered By: Everitt Amber (May 02, 2009 2:23 PM) CC: has been hoarse for 3 weeks now. Don't know what is causing it and she isn't congested, some tenderness in her left ear and throat hurts alittle but no other symptoms   Primary Care Provider:  Syliva Overman MD  CC:  has been hoarse for 3 weeks now. Don't know what is causing it and she isn't congested and some tenderness in her left ear and throat hurts alittle but no other symptoms.  History of Present Illness: pt states ythst since 12/03 she lost her voice. She remained out of work until12/13. her vioicwe got slightly better last fri and Sat. she denies reflux symptoms ,no fevr , chills or green nasal drainage or sputum.  Preventive Screening-Counseling & Management  Alcohol-Tobacco     Smoking Status: current     Packs/Day: 0.5  Current Medications (verified): 1)  Depakote 500 Mg  Tbec (Divalproex Sodium) .... Two Tabs By Mouth in The Morning and One At Night 2)  Singulair 10 Mg Tabs (Montelukast Sodium) .... Take 1 Tablet By Mouth Once A Day 3)  Topamax 25 Mg Tabs (Topiramate) .... Take 1 Tablet By Mouth Two Times A Day  Allergies (verified): 1)  ! * Rubbing Alcohol  Social History: Packs/Day:  0.5  Review of Systems      See HPI CV:  Denies chest pain or discomfort, palpitations, and swelling of feet. GI:  Denies abdominal pain, constipation, diarrhea, nausea, and vomiting. GU:  Denies dysuria and urinary frequency. MS:  Denies joint pain and stiffness.  Physical Exam  General:  Well-developed,well-nourished,in no acute distress; alert,appropriate and cooperative throughout examination,pt extremely hoarse,  barely able to speak HEENT: No facial asymmetry,  EOMI, No sinus tenderness, TM's Clear, oropharynx  pink and moist. nocervical adenitis  Chest: Clear to auscultation bilaterally.  CVS: S1, S2, No murmurs, No S3.   Abd: Soft, Nontender.  MS: Adequate ROM spine, hips, shoulders and knees.  Ext: No edema.   CNS: CN 2-12 intact, power tone and sensation normal throughout.   Skin: Intact, no visible lesions or rashes.  Psych: Good eye contact, normal affect.  Memory intact, not anxious or depressed appearing.    Impression & Recommendations:  Problem # 1:  ACUTE LARYNGITIS, WITHOUT MENTION OF OBSTRUCTIO (ICD-464.00) Assessment Deteriorated  Orders: ENT Referral (ENT) Depo- Medrol 80mg  (J1040) Admin of Therapeutic Inj  intramuscular or subcutaneous ([address]109)  Problem # 2:  ACUTE SINUSITIS, UNSPECIFIED (ICD-461.9) Assessment: Improved  The following medications were removed from the medication list:    Septra Ds 800-160 Mg Tabs (Sulfamethoxazole-trimethoprim) .Marland Kitchen... Take 1 tablet by mouth two times a day    Tessalon Perles 100 Mg Caps (Benzonatate) .Marland Kitchen... Take 1 capsule by mouth three times a day  Complete Medication List: 1)  Depakote 500 Mg Tbec (Divalproex sodium) .... Two tabs by mouth in the morning and one at night 2)  Singulair 10 Mg Tabs (Montelukast sodium) .... Take 1 tablet by mouth once a day 3)  Topamax 25 Mg Tabs (Topiramate) .Marland KitchenMarland KitchenMarland Kitchen  Take 1 tablet by mouth two times a day 4)  Prednisone (pak) 5 Mg Tabs (Prednisone) .... Use as directed  Patient Instructions: 1)  Please KEEP F/U AS BEFORE 2)  you aRE BEING TREATED FOR ACUTE LARYNGITIS. 3)  sTRICT VOICE REST. YOU WILL HAVE MED SENT TO YOUR PHARMACY AND AN INJECTION IN THE OFFOICE ALSO. 4)  OUT OF WORK UNTIL 05/08/2009. 5)  YOU WILL BE REFERRED TO ENT FOR EVAL OF CHRONIC LARYNGITIS. Prescriptions: PREDNISONE (PAK) 5 MG TABS (PREDNISONE) Use as directed  #21 x 0   Entered and Authorized by:   Syliva Overman MD   Signed  by:   Syliva Overman MD on 05/02/2009   Method used:   Electronically to        [facility]  [address] [id number] (retail)       [address]       [place], [place]  [address]       Ph: [phone] or [phone]       Fax: [phone]   RxID:   [id number]    Medication Administration  Injection # 1:    Medication: Depo- Medrol 80mg     Diagnosis: ACUTE LARYNGITIS, WITHOUT MENTION OF OBSTRUCTIO (ICD-464.00)    Route: IM    Site: RUOQ gluteus    Exp Date: 7/12    Lot #: [id number]    Mfr: [employer]    Patient tolerated injection without complications    Given by: Worthy Keeler LPN (May 02, 2009 3:42 PM)  Orders Added: 1)  Est. Patient Level III [25427] 2)  ENT Referral [ENT] 3)  Depo- Medrol 80mg  [J1040] 4)  Admin of Therapeutic Inj  intramuscular or subcutaneous [06237]

## 2010-06-20 NOTE — Letter (Signed)
Summary: medical release  medical release   Imported By: Lind Guest 05/22/2010 15:19:30  _____________________________________________________________________  External Attachment:    Type:   Image     Comment:   External Document

## 2010-06-20 NOTE — Letter (Signed)
Summary: medical release  medical release   Imported By: Lind Guest 05/22/2010 07:59:40  _____________________________________________________________________  External Attachment:    Type:   Image     Comment:   External Document

## 2010-07-31 LAB — CBC
HCT: 38.6 % (ref 36.0–46.0)
Hemoglobin: 12.9 g/dL (ref 12.0–15.0)
MCH: 30.9 pg (ref 26.0–34.0)
MCHC: 33.4 g/dL (ref 30.0–36.0)
MCV: 92.4 fL (ref 78.0–100.0)
Platelets: 245 10*3/uL (ref 150–400)
RBC: 4.17 MIL/uL (ref 3.87–5.11)
RDW: 13.5 % (ref 11.5–15.5)
WBC: 10 10*3/uL (ref 4.0–10.5)

## 2010-07-31 LAB — DIFFERENTIAL
Basophils Absolute: 0 10*3/uL (ref 0.0–0.1)
Basophils Relative: 0 % (ref 0–1)
Eosinophils Absolute: 0 10*3/uL (ref 0.0–0.7)
Eosinophils Relative: 0 % (ref 0–5)
Lymphocytes Relative: 6 % — ABNORMAL LOW (ref 12–46)
Lymphs Abs: 0.6 10*3/uL — ABNORMAL LOW (ref 0.7–4.0)
Monocytes Absolute: 0.7 10*3/uL (ref 0.1–1.0)
Monocytes Relative: 7 % (ref 3–12)
Neutro Abs: 8.8 10*3/uL — ABNORMAL HIGH (ref 1.7–7.7)
Neutrophils Relative %: 88 % — ABNORMAL HIGH (ref 43–77)

## 2010-07-31 LAB — URINALYSIS, ROUTINE W REFLEX MICROSCOPIC
Bilirubin Urine: NEGATIVE
Glucose, UA: NEGATIVE mg/dL
Ketones, ur: 15 mg/dL — AB
Leukocytes, UA: NEGATIVE
Nitrite: NEGATIVE
Specific Gravity, Urine: 1.01 (ref 1.005–1.030)
Urobilinogen, UA: 0.2 mg/dL (ref 0.0–1.0)
pH: 7 (ref 5.0–8.0)

## 2010-07-31 LAB — BASIC METABOLIC PANEL
BUN: 17 mg/dL (ref 6–23)
CO2: 25 mEq/L (ref 19–32)
Calcium: 10 mg/dL (ref 8.4–10.5)
Chloride: 109 mEq/L (ref 96–112)
Creatinine, Ser: 0.82 mg/dL (ref 0.4–1.2)
GFR calc Af Amer: >60 mL/min (ref >60)
GFR calc non Af Amer: >60 mL/min (ref >60)
Glucose, Bld: 136 mg/dL — ABNORMAL HIGH (ref 70–99)
Potassium: 4.2 mEq/L (ref 3.5–5.1)
Sodium: 141 mEq/L (ref 135–145)

## 2010-07-31 LAB — URINE MICROSCOPIC-ADD ON

## 2010-07-31 LAB — POCT PREGNANCY, URINE: Preg Test, Ur: NEGATIVE

## 2010-08-01 LAB — COMPREHENSIVE METABOLIC PANEL WITH GFR
ALT: 32 U/L (ref 0–35)
AST: 38 U/L — ABNORMAL HIGH (ref 0–37)
Albumin: 4 g/dL (ref 3.5–5.2)
Alkaline Phosphatase: 33 U/L — ABNORMAL LOW (ref 39–117)
BUN: 8 mg/dL (ref 6–23)
CO2: 26 meq/L (ref 19–32)
Calcium: 8.8 mg/dL (ref 8.4–10.5)
Chloride: 100 meq/L (ref 96–112)
Creatinine, Ser: 0.62 mg/dL (ref 0.4–1.2)
GFR calc non Af Amer: >60 mL/min
Glucose, Bld: 100 mg/dL — ABNORMAL HIGH (ref 70–99)
Potassium: 3.7 meq/L (ref 3.5–5.1)
Sodium: 133 meq/L — ABNORMAL LOW (ref 135–145)
Total Bilirubin: 0.5 mg/dL (ref 0.3–1.2)
Total Protein: 7.4 g/dL (ref 6.0–8.3)

## 2010-08-01 LAB — DIFFERENTIAL
Basophils Absolute: 0 K/uL (ref 0.0–0.1)
Basophils Relative: 1 % (ref 0–1)
Eosinophils Absolute: 0 K/uL (ref 0.0–0.7)
Eosinophils Relative: 1 % (ref 0–5)
Lymphocytes Relative: 37 % (ref 12–46)
Lymphs Abs: 2.1 K/uL (ref 0.7–4.0)
Monocytes Absolute: 0.4 K/uL (ref 0.1–1.0)
Monocytes Relative: 8 % (ref 3–12)
Neutro Abs: 3 K/uL (ref 1.7–7.7)
Neutrophils Relative %: 54 % (ref 43–77)

## 2010-08-01 LAB — URINE MICROSCOPIC-ADD ON

## 2010-08-01 LAB — CBC
HCT: 36.5 % (ref 36.0–46.0)
Hemoglobin: 12.3 g/dL (ref 12.0–15.0)
MCH: 31 pg (ref 26.0–34.0)
MCHC: 33.7 g/dL (ref 30.0–36.0)
MCV: 92 fL (ref 78.0–100.0)
Platelets: 249 K/uL (ref 150–400)
RBC: 3.97 MIL/uL (ref 3.87–5.11)
RDW: 13.4 % (ref 11.5–15.5)
WBC: 5.7 K/uL (ref 4.0–10.5)

## 2010-08-01 LAB — URINALYSIS, ROUTINE W REFLEX MICROSCOPIC
Bilirubin Urine: NEGATIVE
Bilirubin Urine: NEGATIVE
Glucose, UA: NEGATIVE mg/dL
Glucose, UA: NEGATIVE mg/dL
Hgb urine dipstick: NEGATIVE
Hgb urine dipstick: NEGATIVE
Ketones, ur: 15 mg/dL — AB
Nitrite: NEGATIVE
Nitrite: NEGATIVE
Protein, ur: 30 mg/dL — AB
Protein, ur: NEGATIVE mg/dL
Specific Gravity, Urine: 1.02 (ref 1.005–1.030)
Specific Gravity, Urine: 1.025 (ref 1.005–1.030)
Urobilinogen, UA: 0.2 mg/dL (ref 0.0–1.0)
Urobilinogen, UA: 1 mg/dL (ref 0.0–1.0)
pH: 6.5 (ref 5.0–8.0)
pH: 7 (ref 5.0–8.0)

## 2010-08-01 LAB — PREGNANCY, URINE: Preg Test, Ur: NEGATIVE

## 2010-08-02 LAB — URINALYSIS, ROUTINE W REFLEX MICROSCOPIC
Bilirubin Urine: NEGATIVE
Glucose, UA: NEGATIVE mg/dL
Ketones, ur: >80 mg/dL — AB
Leukocytes, UA: NEGATIVE
Nitrite: NEGATIVE
Protein, ur: NEGATIVE mg/dL
Specific Gravity, Urine: 1.025 (ref 1.005–1.030)
Urobilinogen, UA: 0.2 mg/dL (ref 0.0–1.0)
pH: 6.5 (ref 5.0–8.0)

## 2010-08-02 LAB — WET PREP, GENITAL
Trich, Wet Prep: NONE SEEN
Yeast Wet Prep HPF POC: NONE SEEN

## 2010-08-02 LAB — CBC
HCT: 43.1 % (ref 36.0–46.0)
Hemoglobin: 14.3 g/dL (ref 12.0–15.0)
MCH: 30.4 pg (ref 26.0–34.0)
MCHC: 33.1 g/dL (ref 30.0–36.0)
MCV: 91.8 fL (ref 78.0–100.0)
Platelets: 268 10*3/uL (ref 150–400)
RBC: 4.69 MIL/uL (ref 3.87–5.11)
RDW: 13.1 % (ref 11.5–15.5)
WBC: 11.6 10*3/uL — ABNORMAL HIGH (ref 4.0–10.5)

## 2010-08-02 LAB — GC/CHLAMYDIA PROBE AMP, GENITAL
Chlamydia, DNA Probe: NEGATIVE
GC Probe Amp, Genital: NEGATIVE

## 2010-08-02 LAB — DIFFERENTIAL
Basophils Absolute: 0 10*3/uL (ref 0.0–0.1)
Basophils Relative: 0 % (ref 0–1)
Eosinophils Absolute: 0 10*3/uL (ref 0.0–0.7)
Eosinophils Relative: 0 % (ref 0–5)
Lymphocytes Relative: 6 % — ABNORMAL LOW (ref 12–46)
Lymphs Abs: 0.7 10*3/uL (ref 0.7–4.0)
Monocytes Absolute: 0.5 10*3/uL (ref 0.1–1.0)
Monocytes Relative: 5 % (ref 3–12)
Neutro Abs: 10.3 10*3/uL — ABNORMAL HIGH (ref 1.7–7.7)
Neutrophils Relative %: 89 % — ABNORMAL HIGH (ref 43–77)

## 2010-08-02 LAB — COMPREHENSIVE METABOLIC PANEL
ALT: 23 U/L (ref 0–35)
AST: 32 U/L (ref 0–37)
Albumin: 5.1 g/dL (ref 3.5–5.2)
Alkaline Phosphatase: 46 U/L (ref 39–117)
BUN: 8 mg/dL (ref 6–23)
CO2: 24 mEq/L (ref 19–32)
Calcium: 10.1 mg/dL (ref 8.4–10.5)
Chloride: 102 mEq/L (ref 96–112)
Creatinine, Ser: 0.74 mg/dL (ref 0.4–1.2)
GFR calc Af Amer: >60 mL/min (ref >60)
GFR calc non Af Amer: >60 mL/min (ref >60)
Glucose, Bld: 110 mg/dL — ABNORMAL HIGH (ref 70–99)
Potassium: 3.4 mEq/L — ABNORMAL LOW (ref 3.5–5.1)
Sodium: 137 mEq/L (ref 135–145)
Total Bilirubin: 1 mg/dL (ref 0.3–1.2)
Total Protein: 8.6 g/dL — ABNORMAL HIGH (ref 6.0–8.3)

## 2010-08-02 LAB — POCT PREGNANCY, URINE: Preg Test, Ur: NEGATIVE

## 2010-08-02 LAB — URINE MICROSCOPIC-ADD ON

## 2010-08-02 LAB — LIPASE, BLOOD: Lipase: 25 U/L (ref 11–59)

## 2010-09-11 ENCOUNTER — Emergency Department (HOSPITAL_COMMUNITY)
Admission: EM | Admit: 2010-09-11 | Discharge: 2010-09-11 | Disposition: A | Discharge status: Home / Self Care | Payer: Self-pay | Attending: Emergency Medicine | Admitting: Emergency Medicine

## 2010-09-11 DIAGNOSIS — J029 Acute pharyngitis, unspecified: Secondary | ICD-10-CM | POA: Insufficient documentation

## 2010-09-11 LAB — RAPID STREP SCREEN (MED CTR MEBANE ONLY): Streptococcus, Group A Screen (Direct): NEGATIVE

## 2010-11-19 ENCOUNTER — Emergency Department (HOSPITAL_COMMUNITY): Payer: Self-pay

## 2010-11-19 ENCOUNTER — Emergency Department (HOSPITAL_COMMUNITY)
Admission: EM | Admit: 2010-11-19 | Discharge: 2010-11-19 | Disposition: A | Discharge status: Home / Self Care | Payer: Self-pay | Attending: Emergency Medicine | Admitting: Emergency Medicine

## 2010-11-19 DIAGNOSIS — M545 Low back pain, unspecified: Secondary | ICD-10-CM | POA: Insufficient documentation

## 2010-11-19 DIAGNOSIS — N200 Calculus of kidney: Secondary | ICD-10-CM | POA: Insufficient documentation

## 2010-11-19 DIAGNOSIS — R112 Nausea with vomiting, unspecified: Secondary | ICD-10-CM | POA: Insufficient documentation

## 2010-11-19 DIAGNOSIS — R1031 Right lower quadrant pain: Secondary | ICD-10-CM | POA: Insufficient documentation

## 2010-11-19 DIAGNOSIS — N23 Unspecified renal colic: Secondary | ICD-10-CM | POA: Insufficient documentation

## 2010-11-19 LAB — URINALYSIS, ROUTINE W REFLEX MICROSCOPIC
Bilirubin Urine: NEGATIVE
Glucose, UA: NEGATIVE mg/dL
Ketones, ur: NEGATIVE mg/dL
Leukocytes, UA: NEGATIVE
Nitrite: NEGATIVE
Protein, ur: NEGATIVE mg/dL
Specific Gravity, Urine: 1.01 (ref 1.005–1.030)
Urobilinogen, UA: 0.2 mg/dL (ref 0.0–1.0)
pH: 7.5 (ref 5.0–8.0)

## 2010-11-19 LAB — URINE MICROSCOPIC-ADD ON

## 2010-11-19 LAB — PREGNANCY, URINE: Preg Test, Ur: NEGATIVE

## 2010-11-26 ENCOUNTER — Encounter: Payer: Self-pay | Admitting: *Deleted

## 2010-11-26 ENCOUNTER — Emergency Department (HOSPITAL_COMMUNITY)
Admission: EM | Admit: 2010-11-26 | Discharge: 2010-11-26 | Disposition: A | Discharge status: Home / Self Care | Payer: Self-pay | Attending: Emergency Medicine | Admitting: Emergency Medicine

## 2010-11-26 DIAGNOSIS — R109 Unspecified abdominal pain: Secondary | ICD-10-CM | POA: Insufficient documentation

## 2010-11-26 DIAGNOSIS — N200 Calculus of kidney: Secondary | ICD-10-CM | POA: Insufficient documentation

## 2010-11-26 DIAGNOSIS — F17200 Nicotine dependence, unspecified, uncomplicated: Secondary | ICD-10-CM | POA: Insufficient documentation

## 2010-11-26 HISTORY — DX: Calculus of kidney: N20.0

## 2010-11-26 LAB — URINALYSIS, ROUTINE W REFLEX MICROSCOPIC
Bilirubin Urine: NEGATIVE
Glucose, UA: NEGATIVE mg/dL
Hgb urine dipstick: NEGATIVE
Ketones, ur: 15 mg/dL — AB
Leukocytes, UA: NEGATIVE
Nitrite: NEGATIVE
Protein, ur: NEGATIVE mg/dL
Specific Gravity, Urine: 1.02 (ref 1.005–1.030)
Urobilinogen, UA: 0.2 mg/dL (ref 0.0–1.0)
pH: 6.5 (ref 5.0–8.0)

## 2010-11-26 LAB — BASIC METABOLIC PANEL
BUN: 16 mg/dL (ref 6–23)
CO2: 25 mEq/L (ref 19–32)
Calcium: 9.8 mg/dL (ref 8.4–10.5)
Chloride: 103 mEq/L (ref 96–112)
Creatinine, Ser: 0.74 mg/dL (ref 0.50–1.10)
GFR calc Af Amer: >60 mL/min (ref >60)
GFR calc non Af Amer: >60 mL/min (ref >60)
Glucose, Bld: 77 mg/dL (ref 70–99)
Potassium: 3.9 mEq/L (ref 3.5–5.1)
Sodium: 137 mEq/L (ref 135–145)

## 2010-11-26 LAB — POCT PREGNANCY, URINE: Preg Test, Ur: NEGATIVE

## 2010-11-26 LAB — PREGNANCY, URINE: Preg Test, Ur: NEGATIVE

## 2010-11-26 MED ORDER — PHENAZOPYRIDINE HCL 100 MG PO TABS
200.0000 mg | ORAL_TABLET | Freq: Once | ORAL | Status: AC
  Administered 2010-11-26: 200 mg via ORAL
  Filled 2010-11-26: qty 2

## 2010-11-26 MED ORDER — PHENAZOPYRIDINE HCL 200 MG PO TABS: 200.0000 mg | ORAL_TABLET | Freq: Three times a day (TID) | ORAL | Status: AC

## 2010-11-26 MED ORDER — ONDANSETRON HCL 4 MG/2ML IJ SOLN
4.0000 mg | Freq: Once | INTRAMUSCULAR | Status: AC
  Administered 2010-11-26: 4 mg via INTRAVENOUS
  Filled 2010-11-26: qty 2

## 2010-11-26 MED ORDER — HYDROMORPHONE HCL 2 MG/ML IJ SOLN
1.0000 mg | INTRAMUSCULAR | Status: AC | PRN
  Administered 2010-11-26: 1 mg via INTRAVENOUS
  Filled 2010-11-26: qty 1

## 2010-11-26 MED ORDER — SODIUM CHLORIDE 0.9 % IV SOLN
999.0000 mL | INTRAVENOUS | Status: DC
  Administered 2010-11-26: 999 mL via INTRAVENOUS

## 2010-11-26 NOTE — ED Notes (Signed)
C/o right flank pain since 11/19/10-was seen and tx here for same; dx with kidney stones; states passed one stone 5 days ago, but continues to have pain; states the meds Rx for pain are almost gone and are not really helping control pain (Percocet)

## 2010-11-27 ENCOUNTER — Other Ambulatory Visit (HOSPITAL_COMMUNITY): Payer: Self-pay | Admitting: Urology

## 2010-11-27 ENCOUNTER — Ambulatory Visit (HOSPITAL_COMMUNITY)
Admission: RE | Admit: 2010-11-27 | Discharge: 2010-11-27 | Disposition: A | Discharge status: Home / Self Care | Payer: Self-pay | Source: Ambulatory Visit | Attending: Urology | Admitting: Urology

## 2010-11-27 DIAGNOSIS — N200 Calculus of kidney: Secondary | ICD-10-CM

## 2010-11-27 DIAGNOSIS — R109 Unspecified abdominal pain: Secondary | ICD-10-CM | POA: Insufficient documentation

## 2010-11-27 DIAGNOSIS — R9389 Abnormal findings on diagnostic imaging of other specified body structures: Secondary | ICD-10-CM | POA: Insufficient documentation

## 2010-11-30 NOTE — ED Provider Notes (Signed)
History     Chief Complaint  Patient presents with  . Flank Pain    right flank pain onset 11/19/10   HPI Comments: She was seen here 1 week ago and diagnosed with a right ureteral stone,  Which she passed but continues to have right flank pain.  Patient is a 20 y.o. female presenting with flank pain. The history is provided by the patient.  Flank Pain This is a recurrent problem. The current episode started in the past 7 days. The problem occurs constantly. The problem has been waxing and waning. Associated symptoms include urinary symptoms. Pertinent negatives include no abdominal pain, arthralgias, chest pain, chills, congestion, fatigue, fever, headaches, joint swelling, nausea, neck pain, numbness, rash, sore throat or weakness. The symptoms are aggravated by nothing. She has tried oral narcotics for the symptoms. The treatment provided mild relief.    Past Medical History  Diagnosis Date  . Kidney stones     Past Surgical History  Procedure Date  . Appendectomy     No family history on file.  History  Substance Use Topics  . Smoking status: Current Everyday Smoker -- 0.5 packs/day    Types: Cigarettes  . Smokeless tobacco: Not on file  . Alcohol Use:     OB History    Grav Para Term Preterm Abortions TAB SAB Ect Mult Living                  Review of Systems  Constitutional: Negative for fever, chills and fatigue.  HENT: Negative for congestion, sore throat and neck pain.   Eyes: Negative.   Respiratory: Negative for chest tightness and shortness of breath.   Cardiovascular: Negative for chest pain.  Gastrointestinal: Negative for nausea and abdominal pain.  Genitourinary: Positive for frequency and flank pain. Negative for hematuria, vaginal bleeding and vaginal discharge.  Musculoskeletal: Negative for joint swelling and arthralgias.  Skin: Negative.  Negative for rash and wound.  Neurological: Negative for dizziness, weakness, light-headedness, numbness and  headaches.  Hematological: Negative.   Psychiatric/Behavioral: Negative.     Physical Exam  BP 121/66  Pulse 99  Temp(Src) 98.1 F (36.7 C) (Oral)  Resp 18  Ht 5\' 3"  (1.6 m)  Wt 142 lb (64.411 kg)  BMI 25.15 kg/m2  SpO2 98%  Physical Exam  Vitals reviewed. Constitutional: She is oriented to person, place, and time. She appears well-developed and well-nourished.  HENT:  Head: Normocephalic and atraumatic.  Eyes: Conjunctivae are normal.  Neck: Normal range of motion.  Cardiovascular: Normal rate, regular rhythm, normal heart sounds and intact distal pulses.   Pulmonary/Chest: Effort normal and breath sounds normal. She has no wheezes.  Abdominal: Soft. Bowel sounds are normal. There is no hepatosplenomegaly. There is no tenderness. There is no CVA tenderness.  Musculoskeletal: Normal range of motion.  Neurological: She is alert and oriented to person, place, and time.  Skin: Skin is warm and dry.  Psychiatric: She has a normal mood and affect.    ED Course  Procedures  MDM        Candis Musa, [place] 11/30/10 2130

## 2010-12-03 NOTE — ED Provider Notes (Signed)
Medical screening examination/treatment/procedure(s) were performed by non-physician practitioner and as supervising physician I was immediately available for consultation/collaboration.  Joya Gaskins, MD 12/03/10 [phone]

## 2011-01-12 ENCOUNTER — Encounter (HOSPITAL_COMMUNITY): Payer: Self-pay

## 2011-01-12 ENCOUNTER — Emergency Department (HOSPITAL_COMMUNITY)
Admission: EM | Admit: 2011-01-12 | Discharge: 2011-01-12 | Disposition: A | Discharge status: Home / Self Care | Payer: Self-pay | Attending: Emergency Medicine | Admitting: Emergency Medicine

## 2011-01-12 DIAGNOSIS — Z87442 Personal history of urinary calculi: Secondary | ICD-10-CM | POA: Insufficient documentation

## 2011-01-12 DIAGNOSIS — G43909 Migraine, unspecified, not intractable, without status migrainosus: Secondary | ICD-10-CM | POA: Insufficient documentation

## 2011-01-12 DIAGNOSIS — F17200 Nicotine dependence, unspecified, uncomplicated: Secondary | ICD-10-CM | POA: Insufficient documentation

## 2011-01-12 MED ORDER — METOCLOPRAMIDE HCL 5 MG/ML IJ SOLN
10.0000 mg | Freq: Once | INTRAMUSCULAR | Status: AC
  Administered 2011-01-12: 10 mg via INTRAVENOUS
  Filled 2011-01-12: qty 2

## 2011-01-12 MED ORDER — DIPHENHYDRAMINE HCL 50 MG/ML IJ SOLN
25.0000 mg | Freq: Once | INTRAMUSCULAR | Status: AC
  Administered 2011-01-12: 25 mg via INTRAVENOUS
  Filled 2011-01-12: qty 2

## 2011-01-12 MED ORDER — KETOROLAC TROMETHAMINE 30 MG/ML IJ SOLN
30.0000 mg | Freq: Once | INTRAMUSCULAR | Status: AC
  Administered 2011-01-12: 30 mg via INTRAVENOUS
  Filled 2011-01-12: qty 1

## 2011-01-12 MED ORDER — BUTALBITAL-APAP-CAFFEINE 50-325-40 MG PO TABS: 1.0000 | ORAL_TABLET | Freq: Four times a day (QID) | ORAL | Status: DC | PRN

## 2011-01-12 NOTE — ED Provider Notes (Signed)
History     CSN: [id number] Arrival date & time: 01/12/2011  7:28 PM  Chief Complaint  Patient presents with  . Migraine   Patient is a 20 y.o. female presenting with migraine. The history is provided by the patient.  Migraine This is a recurrent problem. The current episode started yesterday. The problem occurs constantly. The problem has not changed since onset.Associated symptoms include headaches. Pertinent negatives include no chest pain, no abdominal pain and no shortness of breath. Exacerbated by: light. The symptoms are relieved by nothing. She has tried acetaminophen for the symptoms. The treatment provided no relief.    Past Medical History  Diagnosis Date  . Kidney stones     Past Surgical History  Procedure Date  . Appendectomy     No family history on file.  History  Substance Use Topics  . Smoking status: Current Everyday Smoker -- 0.5 packs/day    Types: Cigarettes  . Smokeless tobacco: Not on file  . Alcohol Use: No    OB History    Grav Para Term Preterm Abortions TAB SAB Ect Mult Living                  Review of Systems  Constitutional: Negative for fever.  Respiratory: Negative for shortness of breath.   Cardiovascular: Negative for chest pain.  Gastrointestinal: Negative for abdominal pain.  Neurological: Positive for headaches. Negative for dizziness, tremors, seizures, facial asymmetry and numbness.    Physical Exam  BP 126/71  Pulse 92  Temp(Src) 98.5 F (36.9 C) (Oral)  Resp 16  Ht 5\' 3"  (1.6 m)  Wt 142 lb (64.411 kg)  BMI 25.15 kg/m2  SpO2 100%  Physical Exam  Nursing note and vitals reviewed. Constitutional: She is oriented to person, place, and time. She appears well-developed and well-nourished. No distress.  HENT:  Head: Normocephalic and atraumatic.  Right Ear: External ear normal.  Left Ear: External ear normal.  Eyes: Conjunctivae are normal. Right eye exhibits no discharge. Left eye exhibits no discharge. No scleral  icterus.  Neck: Neck supple. No tracheal deviation present.       No meningeal signs  Cardiovascular: Normal rate, regular rhythm and intact distal pulses.   Pulmonary/Chest: Effort normal and breath sounds normal. No stridor. No respiratory distress. She has no wheezes. She has no rales.  Abdominal: Soft. Bowel sounds are normal. She exhibits no distension. There is no tenderness. There is no rebound and no guarding.  Musculoskeletal: She exhibits no edema and no tenderness.  Neurological: She is alert and oriented to person, place, and time. She has normal strength. No cranial nerve deficit ( no gross defecits noted) or sensory deficit. She exhibits normal muscle tone. She displays no seizure activity. Coordination normal.       5 bilateral upper extremity and lower extremity strength, sensation intact in all extremities, no visual field cuts, no left or right sided neglect  Skin: Skin is warm and dry. No rash noted.  Psychiatric: She has a normal mood and affect.    ED Course  Procedures Patient was given a migraine cocktail consisting of Reglan Benadryl and Toradol.  9:40 PM Patient states she is feeling much better and is ready to go home. MDM Consistent with a migraine headache. No signs to suggest subarachnoid hemorrhage meningitis or tumor. DC home with a prescription for Fioricet      Celene Kras, MD 01/12/11 2141

## 2011-01-12 NOTE — ED Notes (Signed)
Pt reports migraine that began yesterday.  Pt reports n/v, sensitivity to light.  Pt reports trying to "sleep it off" but when she woke today she still had the headache.

## 2011-03-17 ENCOUNTER — Emergency Department (HOSPITAL_COMMUNITY)
Admission: EM | Admit: 2011-03-17 | Discharge: 2011-03-17 | Disposition: A | Discharge status: Home / Self Care | Payer: Medicaid Other | Attending: Emergency Medicine | Admitting: Emergency Medicine

## 2011-03-17 ENCOUNTER — Encounter (HOSPITAL_COMMUNITY): Payer: Self-pay | Admitting: *Deleted

## 2011-03-17 DIAGNOSIS — F17200 Nicotine dependence, unspecified, uncomplicated: Secondary | ICD-10-CM | POA: Insufficient documentation

## 2011-03-17 DIAGNOSIS — Z87442 Personal history of urinary calculi: Secondary | ICD-10-CM | POA: Insufficient documentation

## 2011-03-17 DIAGNOSIS — M654 Radial styloid tenosynovitis [de Quervain]: Secondary | ICD-10-CM | POA: Insufficient documentation

## 2011-03-17 MED ORDER — IBUPROFEN 800 MG PO TABS
800.0000 mg | ORAL_TABLET | Freq: Once | ORAL | Status: AC
  Administered 2011-03-17: 800 mg via ORAL
  Filled 2011-03-17: qty 1

## 2011-03-17 MED ORDER — IBUPROFEN 800 MG PO TABS: 800.0000 mg | ORAL_TABLET | Freq: Three times a day (TID) | ORAL | Status: AC | PRN

## 2011-03-17 NOTE — ED Provider Notes (Signed)
History     CSN: [id number] Arrival date & time: 03/17/2011  4:57 PM   First MD Initiated Contact with Patient 03/17/11 1704      Chief Complaint  Patient presents with  . Arm Pain    (Consider location/radiation/quality/duration/timing/severity/associated sxs/prior treatment) HPI Comments: She describes pain in her right hand and wrist which is worsened with movement of her thumb and wrist.  She denies injury,  But states she first noticed when she tried to pick up a heavy bucket at work last night.    Patient is a 20 y.o. female presenting with arm pain. The history is provided by the patient.  Arm Pain This is a new problem. The current episode started yesterday. The problem occurs constantly. The problem has been unchanged. Associated symptoms include arthralgias. Pertinent negatives include no abdominal pain, chest pain, congestion, fever, headaches, joint swelling, nausea, neck pain, numbness, rash, sore throat or weakness. Exacerbated by: certain positions and palpation. She has tried nothing for the symptoms.    Past Medical History  Diagnosis Date  . Kidney stones     Past Surgical History  Procedure Date  . Appendectomy     History reviewed. No pertinent family history.  History  Substance Use Topics  . Smoking status: Current Everyday Smoker -- 0.5 packs/day    Types: Cigarettes  . Smokeless tobacco: Not on file  . Alcohol Use: No    OB History    Grav Para Term Preterm Abortions TAB SAB Ect Mult Living                  Review of Systems  Constitutional: Negative for fever.  HENT: Negative for congestion, sore throat and neck pain.   Eyes: Negative.   Respiratory: Negative for chest tightness and shortness of breath.   Cardiovascular: Negative for chest pain.  Gastrointestinal: Negative for nausea and abdominal pain.  Genitourinary: Negative.   Musculoskeletal: Positive for arthralgias. Negative for joint swelling.  Skin: Negative.  Negative for  rash and wound.  Neurological: Negative for dizziness, weakness, light-headedness, numbness and headaches.  Hematological: Negative.   Psychiatric/Behavioral: Negative.     Allergies  Review of patient's allergies indicates no known allergies.  Home Medications   Current Outpatient Rx  Name Route Sig Dispense Refill  . FLUTICASONE PROPIONATE 50 MCG/ACT NA SUSP Nasal Place 2 sprays into the nose daily.      . IBUPROFEN 200 MG PO TABS Oral Take 200 mg by mouth as needed. For pain     . BUTALBITAL-APAP-CAFFEINE 50-325-40 MG PO TABS Oral Take 1-2 tablets by mouth every 6 (six) hours as needed for headache (max 6 tabs per day). 20 tablet 0  . MEDROXYPROGESTERONE ACETATE 150 MG/ML IM SUSP Intramuscular Inject 150 mg into the muscle every 3 (three) months.      . ONDANSETRON HCL 4 MG PO TABS Oral Take 4 mg by mouth every 8 (eight) hours as needed.      . OXYCODONE-ACETAMINOPHEN 5-325 MG PO TABS Oral Take 1 tablet by mouth every 4 (four) hours as needed.        BP 125/75  Pulse 68  Temp(Src) 98.5 F (36.9 C) (Oral)  Resp 16  Ht 5\' 3"  (1.6 m)  Wt 148 lb (67.132 kg)  BMI 26.22 kg/m2  SpO2 100%  Physical Exam  Nursing note and vitals reviewed. Constitutional: She is oriented to person, place, and time. She appears well-developed and well-nourished.  HENT:  Head: Normocephalic.  Eyes: Conjunctivae are  normal.  Neck: Normal range of motion.  Cardiovascular: Normal rate and intact distal pulses.  Exam reveals no decreased pulses.   Pulses:      Dorsalis pedis pulses are 2+ on the right side, and 2+ on the left side.       Posterior tibial pulses are 2+ on the right side, and 2+ on the left side.  Pulmonary/Chest: Effort normal.  Musculoskeletal: She exhibits tenderness.       Right wrist: She exhibits decreased range of motion and tenderness. She exhibits no swelling, no effusion and no deformity.       Right ankle: She exhibits abnormal pulse.       Positive finkelstein test.   Negative tinels and phalens test.  Neurological: She is alert and oriented to person, place, and time. No sensory deficit.  Skin: Skin is warm, dry and intact.    ED Course  Procedures (including critical care time)  Labs Reviewed - No data to display No results found.   No diagnosis found.    MDM  Thumb spica,  Ibuprofen,  Ice,  Elevation,  Referral to Dr Hilda Lias prn.        Candis Musa, PA 03/17/11 1729

## 2011-03-17 NOTE — ED Notes (Signed)
Pt c/o pain in her right arm, wrist and hand since last night at work. Denies injury.

## 2011-03-18 NOTE — ED Provider Notes (Signed)
Medical screening examination/treatment/procedure(s) were performed by non-physician practitioner and as supervising physician I was immediately available for consultation/collaboration.  Monesha Monreal, MD 03/18/11 0152 

## 2011-03-25 ENCOUNTER — Emergency Department (HOSPITAL_COMMUNITY)
Admission: EM | Admit: 2011-03-25 | Discharge: 2011-03-25 | Disposition: A | Discharge status: Home / Self Care | Payer: Medicaid Other | Attending: Emergency Medicine | Admitting: Emergency Medicine

## 2011-03-25 ENCOUNTER — Encounter (HOSPITAL_COMMUNITY): Payer: Self-pay | Admitting: Emergency Medicine

## 2011-03-25 DIAGNOSIS — R209 Unspecified disturbances of skin sensation: Secondary | ICD-10-CM | POA: Insufficient documentation

## 2011-03-25 DIAGNOSIS — F17200 Nicotine dependence, unspecified, uncomplicated: Secondary | ICD-10-CM | POA: Insufficient documentation

## 2011-03-25 DIAGNOSIS — M25531 Pain in right wrist: Secondary | ICD-10-CM

## 2011-03-25 DIAGNOSIS — Z87442 Personal history of urinary calculi: Secondary | ICD-10-CM | POA: Insufficient documentation

## 2011-03-25 DIAGNOSIS — M25539 Pain in unspecified wrist: Secondary | ICD-10-CM | POA: Insufficient documentation

## 2011-03-25 HISTORY — DX: Carpal tunnel syndrome, unspecified upper limb: G56.00

## 2011-03-25 MED ORDER — HYDROCODONE-ACETAMINOPHEN 5-325 MG PO TABS: ORAL_TABLET | ORAL | Status: AC

## 2011-03-25 MED ORDER — IBUPROFEN 800 MG PO TABS: 800.0000 mg | ORAL_TABLET | Freq: Three times a day (TID) | ORAL | Status: AC

## 2011-03-25 NOTE — ED Notes (Signed)
Patient c/o pain in right hand since Sunday night. Patient was seen here Monday for it and diagnosed with carpal tunnel syndrome. Per patient not supposed lift over 25lbs. 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

## 2011-03-25 NOTE — ED Provider Notes (Signed)
History     CSN: [id number] Arrival date & time: 03/25/2011  3:35 PM   First MD Initiated Contact with Patient 03/25/11 1559      Chief Complaint  Patient presents with  . Hand Pain    (Consider location/radiation/quality/duration/timing/severity/associated sxs/prior treatment) HPI Comments: Patient c/o pain ,numbness and tingling sensation to her right hand and wrist. Tingling primarily of the second, third and fourth fingers States that she was seen here 2 days ago for same and pain is getting worse.  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 Denies erythema or edema.  Patient is a 20 y.o. female presenting with hand pain. The history is provided by the patient.  Hand Pain This is a recurrent problem. The current episode started in the past 7 days. The problem occurs constantly. The problem has been gradually worsening. Associated symptoms include arthralgias and numbness. Pertinent negatives include no chills, fatigue, fever, joint swelling, myalgias, nausea, neck pain, rash, sore throat, vomiting or weakness. Associated symptoms comments: Tingling sensation. Exacerbated by: use of the hand and palpation. She has tried NSAIDs for the symptoms. The treatment provided no relief.    Past Medical History  Diagnosis Date  . Kidney stones   . Carpal tunnel syndrome     Past Surgical History  Procedure Date  . Appendectomy     Family History  Problem Relation Age of Onset  . Hypertension Mother   . Diabetes Other   . Thyroid disease Other     History  Substance Use Topics  . Smoking status: Current Everyday Smoker -- 0.5 packs/day for 3 years    Types: Cigarettes  . Smokeless tobacco: Never Used  . Alcohol Use: No    OB History    Grav Para Term Preterm Abortions TAB SAB Ect Mult Living                  Review of Systems  Constitutional: Negative for fever, chills and fatigue.  HENT: Negative for  sore throat, trouble swallowing and neck pain.   Gastrointestinal: Negative for nausea and vomiting.  Genitourinary: Negative for dysuria, hematuria and flank pain.  Musculoskeletal: Positive for arthralgias. Negative for myalgias, back pain and joint swelling.  Skin: Negative.  Negative for rash.  Neurological: Positive for numbness. Negative for dizziness, speech difficulty and weakness.  Hematological: Negative for adenopathy. Does not bruise/bleed easily.  All other systems reviewed and are negative.    Allergies  Review of patient's allergies indicates no known allergies.  Home Medications   Current Outpatient Rx  Name Route Sig Dispense Refill  . FLUTICASONE PROPIONATE 50 MCG/ACT NA SUSP Nasal Place 2 sprays into the nose daily.      . IBUPROFEN 200 MG PO TABS Oral Take 200 mg by mouth as needed. For pain     . IBUPROFEN 800 MG PO TABS Oral Take 1 tablet (800 mg total) by mouth every 8 (eight) hours as needed for pain. 15 tablet 0  . MEDROXYPROGESTERONE ACETATE 150 MG/ML IM SUSP Intramuscular Inject 150 mg into the muscle every 3 (three) months.        BP 129/67  Pulse 80  Temp(Src) 98.8 F (37.1 C) (Oral)  Resp 16  Ht 5\' 3"  (1.6 m)  Wt 151 lb (68.493 kg)  BMI 26.75 kg/m2  SpO2 100%  Physical Exam  Nursing note and vitals reviewed. Constitutional: She is oriented to person, place, and time.  She appears well-developed and well-nourished. No distress.  HENT:  Head: Normocephalic and atraumatic.  Neck: Normal range of motion. Neck supple.  Cardiovascular: Normal rate, regular rhythm and normal heart sounds.   Pulmonary/Chest: Effort normal and breath sounds normal.  Musculoskeletal: Normal range of motion. She exhibits tenderness. She exhibits no edema.       Right wrist: She exhibits tenderness. She exhibits normal range of motion, no bony tenderness, no swelling, no effusion, no crepitus, no deformity and no laceration.       ttp of the palmar surface of the right  hand and wrist.  Lourena Simmonds test is negative.    Lymphadenopathy:    She has no cervical adenopathy.  Neurological: She is alert and oriented to person, place, and time. No cranial nerve deficit. She exhibits normal muscle tone. Coordination normal.  Reflex Scores:      Tricep reflexes are 2+ on the right side and 2+ on the left side.      Bicep reflexes are 2+ on the right side and 2+ on the left side. Skin: Skin is warm and dry.  Psychiatric:       Tearful due to level of pain    ED Course  Procedures (including critical care time)       MDM     4:28 PM ttp of the palmar surface of the right hand and wrist.   Wearing a thumb spica splint.  She has slightly decreased sensation to her index, middle and ring fingers.  CR<2 sec, radial pulse is brisk. No edema or erythema, she has full ROM of the fingers and wrist.   I suspect carpal tunnel syndrome.  Pt has appt with ortho next week.  I will change her splint to a cock up wrist brace, she agrees to f/u with Dr. Hilda Lias.    I have reviewed pt's previous ED chart and imaging     Cosima Prentiss L. [place], [place] 03/26/11 1478

## 2011-03-27 NOTE — ED Provider Notes (Signed)
Medical screening examination/treatment/procedure(s) were performed by non-physician practitioner and as supervising physician I was immediately available for consultation/collaboration.   Joya Gaskins, MD 03/27/11 1339

## 2011-04-20 ENCOUNTER — Encounter (HOSPITAL_COMMUNITY): Payer: Self-pay | Admitting: *Deleted

## 2011-04-20 ENCOUNTER — Emergency Department (HOSPITAL_COMMUNITY)
Admission: EM | Admit: 2011-04-20 | Discharge: 2011-04-20 | Disposition: A | Discharge status: Home / Self Care | Payer: Medicaid Other | Attending: Emergency Medicine | Admitting: Emergency Medicine

## 2011-04-20 DIAGNOSIS — F17200 Nicotine dependence, unspecified, uncomplicated: Secondary | ICD-10-CM | POA: Insufficient documentation

## 2011-04-20 DIAGNOSIS — K5289 Other specified noninfective gastroenteritis and colitis: Secondary | ICD-10-CM | POA: Insufficient documentation

## 2011-04-20 DIAGNOSIS — K529 Noninfective gastroenteritis and colitis, unspecified: Secondary | ICD-10-CM

## 2011-04-20 DIAGNOSIS — Z87442 Personal history of urinary calculi: Secondary | ICD-10-CM | POA: Insufficient documentation

## 2011-04-20 DIAGNOSIS — G5600 Carpal tunnel syndrome, unspecified upper limb: Secondary | ICD-10-CM | POA: Insufficient documentation

## 2011-04-20 LAB — CBC
HCT: 41.2 % (ref 36.0–46.0)
Hemoglobin: 13.5 g/dL (ref 12.0–15.0)
MCH: 30.7 pg (ref 26.0–34.0)
MCHC: 32.8 g/dL (ref 30.0–36.0)
MCV: 93.6 fL (ref 78.0–100.0)
Platelets: 250 10*3/uL (ref 150–400)
RBC: 4.4 MIL/uL (ref 3.87–5.11)
RDW: 12.4 % (ref 11.5–15.5)
WBC: 4.7 10*3/uL (ref 4.0–10.5)

## 2011-04-20 LAB — BASIC METABOLIC PANEL
BUN: 14 mg/dL (ref 6–23)
CO2: 27 mEq/L (ref 19–32)
Calcium: 9.8 mg/dL (ref 8.4–10.5)
Chloride: 99 mEq/L (ref 96–112)
Creatinine, Ser: 0.71 mg/dL (ref 0.50–1.10)
GFR calc Af Amer: >90 mL/min (ref >90)
GFR calc non Af Amer: >90 mL/min (ref >90)
Glucose, Bld: 83 mg/dL (ref 70–99)
Potassium: 3.5 mEq/L (ref 3.5–5.1)
Sodium: 132 mEq/L — ABNORMAL LOW (ref 135–145)

## 2011-04-20 LAB — DIFFERENTIAL
Basophils Absolute: 0 10*3/uL (ref 0.0–0.1)
Basophils Relative: 0 % (ref 0–1)
Eosinophils Absolute: 0 10*3/uL (ref 0.0–0.7)
Eosinophils Relative: 1 % (ref 0–5)
Lymphocytes Relative: 41 % (ref 12–46)
Lymphs Abs: 1.9 10*3/uL (ref 0.7–4.0)
Monocytes Absolute: 0.5 10*3/uL (ref 0.1–1.0)
Monocytes Relative: 11 % (ref 3–12)
Neutro Abs: 2.2 10*3/uL (ref 1.7–7.7)
Neutrophils Relative %: 47 % (ref 43–77)

## 2011-04-20 MED ORDER — SODIUM CHLORIDE 0.9 % IV BOLUS (SEPSIS)
1000.0000 mL | Freq: Once | INTRAVENOUS | Status: AC
  Administered 2011-04-20: 1000 mL via INTRAVENOUS

## 2011-04-20 MED ORDER — PROMETHAZINE HCL 25 MG PO TABS: 25.0000 mg | ORAL_TABLET | Freq: Four times a day (QID) | ORAL | Status: DC | PRN

## 2011-04-20 NOTE — ED Notes (Signed)
Pt states awoke today with diarrhea, vomiting and cold like symptoms in chest. Pt states vomited x 5 today. Denies fever and chills, pt resting with family at bedside.

## 2011-04-20 NOTE — ED Notes (Signed)
Pt c/o tightness in chest with sore throat, n/v and chills

## 2011-04-20 NOTE — ED Provider Notes (Signed)
History     CSN: [id number] Arrival date & time: 04/20/2011  6:58 PM   First MD Initiated Contact with Patient 04/20/11 1902      Chief Complaint  Patient presents with  . Emesis  . Diarrhea  . Palpitations  . Sore Throat    (Consider location/radiation/quality/duration/timing/severity/associated sxs/prior treatment) Patient is a 20 y.o. female presenting with vomiting and diarrhea. The history is provided by the patient (Patient states that she's had vomiting and diarrhea for one day now she's also had some chills).  Emesis  This is a new problem. The current episode started yesterday. The problem occurs 5 to 10 times per day. The problem has not changed since onset.The emesis has an appearance of stomach contents. The fever has been present for less than 1 day. Associated symptoms include chills and diarrhea. Pertinent negatives include no abdominal pain, no arthralgias, no cough and no headaches.  Diarrhea The primary symptoms include nausea, vomiting and diarrhea. Primary symptoms do not include fatigue, abdominal pain, hematemesis, dysuria, arthralgias or rash. The illness began yesterday. The onset was sudden.  The illness is also significant for chills. The illness does not include dysphagia or back pain. Associated medical issues do not include GERD or bowel resection.    Past Medical History  Diagnosis Date  . Kidney stones   . Carpal tunnel syndrome     Past Surgical History  Procedure Date  . Appendectomy     Family History  Problem Relation Age of Onset  . Hypertension Mother   . Diabetes Other   . Thyroid disease Other     History  Substance Use Topics  . Smoking status: Current Everyday Smoker -- 0.5 packs/day for 3 years    Types: Cigarettes  . Smokeless tobacco: Never Used  . Alcohol Use: No    OB History    Grav Para Term Preterm Abortions TAB SAB Ect Mult Living                  Review of Systems  Constitutional: Positive for chills.  Negative for fatigue.  HENT: Negative for congestion, sinus pressure and ear discharge.   Eyes: Negative for discharge.  Respiratory: Negative for cough.   Cardiovascular: Negative for chest pain.  Gastrointestinal: Positive for nausea, vomiting and diarrhea. Negative for dysphagia, abdominal pain and hematemesis.  Genitourinary: Negative for dysuria, frequency and hematuria.  Musculoskeletal: Negative for back pain and arthralgias.  Skin: Negative for rash.  Neurological: Negative for seizures and headaches.  Hematological: Negative.   Psychiatric/Behavioral: Negative for hallucinations.    Allergies  Review of patient's allergies indicates no known allergies.  Home Medications   Current Outpatient Rx  Name Route Sig Dispense Refill  . FLUTICASONE PROPIONATE 50 MCG/ACT NA SUSP Nasal Place 2 sprays into the nose daily.      Marland Kitchen HYDROCODONE-ACETAMINOPHEN 7.5-325 MG PO TABS Oral Take 1 tablet by mouth every 6 (six) hours as needed. For pain     . IBUPROFEN 200 MG PO TABS Oral Take 200 mg by mouth as needed. For pain     . MEDROXYPROGESTERONE ACETATE 150 MG/ML IM SUSP Intramuscular Inject 150 mg into the muscle every 3 (three) months.      Marland Kitchen PROMETHAZINE HCL 25 MG PO TABS Oral Take 1 tablet (25 mg total) by mouth every 6 (six) hours as needed for nausea. 10 tablet 0    BP 115/74  Pulse 68  Temp(Src) 98.5 F (36.9 C) (Oral)  Resp 20  Ht  5\' 3"  (1.6 m)  Wt 153 lb (69.4 kg)  BMI 27.10 kg/m2  SpO2 99%  Physical Exam  Constitutional: She is oriented to person, place, and time. She appears well-developed.  HENT:  Head: Normocephalic and atraumatic.  Eyes: Conjunctivae and EOM are normal. No scleral icterus.  Neck: Neck supple. No thyromegaly present.  Cardiovascular: Normal rate and regular rhythm.  Exam reveals no gallop and no friction rub.   No murmur heard. Pulmonary/Chest: No stridor. She has no wheezes. She has no rales. She exhibits no tenderness.  Abdominal: She exhibits no  distension. There is tenderness. There is no rebound.       Mild abd tendernous  Musculoskeletal: Normal range of motion. She exhibits no edema.  Lymphadenopathy:    She has no cervical adenopathy.  Neurological: She is oriented to person, place, and time. Coordination normal.  Skin: No rash noted. No erythema.  Psychiatric: She has a normal mood and affect. Her behavior is normal.    ED Course  Procedures (including critical care time)  Labs Reviewed  BASIC METABOLIC PANEL - Abnormal; Notable for the following:    Sodium 132 (*)    All other components within normal limits  CBC  DIFFERENTIAL   No results found.   1. Gastroenteritis       MDM  gastroenteritis        Benny Lennert, MD 04/20/11 2119

## 2011-06-20 ENCOUNTER — Encounter (HOSPITAL_COMMUNITY): Payer: Self-pay | Admitting: Emergency Medicine

## 2011-06-20 ENCOUNTER — Emergency Department (HOSPITAL_COMMUNITY)
Admission: EM | Admit: 2011-06-20 | Discharge: 2011-06-20 | Disposition: A | Discharge status: Home / Self Care | Payer: Self-pay | Attending: Emergency Medicine | Admitting: Emergency Medicine

## 2011-06-20 DIAGNOSIS — G5600 Carpal tunnel syndrome, unspecified upper limb: Secondary | ICD-10-CM | POA: Insufficient documentation

## 2011-06-20 DIAGNOSIS — R1013 Epigastric pain: Secondary | ICD-10-CM | POA: Insufficient documentation

## 2011-06-20 DIAGNOSIS — F17200 Nicotine dependence, unspecified, uncomplicated: Secondary | ICD-10-CM | POA: Insufficient documentation

## 2011-06-20 DIAGNOSIS — R112 Nausea with vomiting, unspecified: Secondary | ICD-10-CM | POA: Insufficient documentation

## 2011-06-20 DIAGNOSIS — Z9889 Other specified postprocedural states: Secondary | ICD-10-CM | POA: Insufficient documentation

## 2011-06-20 DIAGNOSIS — R10816 Epigastric abdominal tenderness: Secondary | ICD-10-CM | POA: Insufficient documentation

## 2011-06-20 DIAGNOSIS — Z87442 Personal history of urinary calculi: Secondary | ICD-10-CM | POA: Insufficient documentation

## 2011-06-20 LAB — CBC
HCT: 39.8 % (ref 36.0–46.0)
Hemoglobin: 13 g/dL (ref 12.0–15.0)
MCH: 30.3 pg (ref 26.0–34.0)
MCHC: 32.7 g/dL (ref 30.0–36.0)
MCV: 92.8 fL (ref 78.0–100.0)
Platelets: 234 10*3/uL (ref 150–400)
RBC: 4.29 MIL/uL (ref 3.87–5.11)
RDW: 12.2 % (ref 11.5–15.5)
WBC: 8.6 10*3/uL (ref 4.0–10.5)

## 2011-06-20 LAB — COMPREHENSIVE METABOLIC PANEL
ALT: 10 U/L (ref 0–35)
AST: 15 U/L (ref 0–37)
Albumin: 4.3 g/dL (ref 3.5–5.2)
Alkaline Phosphatase: 45 U/L (ref 39–117)
BUN: 13 mg/dL (ref 6–23)
CO2: 25 mEq/L (ref 19–32)
Calcium: 10.1 mg/dL (ref 8.4–10.5)
Chloride: 103 mEq/L (ref 96–112)
Creatinine, Ser: 0.67 mg/dL (ref 0.50–1.10)
GFR calc Af Amer: >90 mL/min (ref >90)
GFR calc non Af Amer: >90 mL/min (ref >90)
Glucose, Bld: 142 mg/dL — ABNORMAL HIGH (ref 70–99)
Potassium: 3.4 mEq/L — ABNORMAL LOW (ref 3.5–5.1)
Sodium: 139 mEq/L (ref 135–145)
Total Bilirubin: 0.6 mg/dL (ref 0.3–1.2)
Total Protein: 7.6 g/dL (ref 6.0–8.3)

## 2011-06-20 LAB — URINALYSIS, MICROSCOPIC ONLY
Bilirubin Urine: NEGATIVE
Glucose, UA: NEGATIVE mg/dL
Ketones, ur: NEGATIVE mg/dL
Leukocytes, UA: NEGATIVE
Nitrite: NEGATIVE
Protein, ur: NEGATIVE mg/dL
Specific Gravity, Urine: 1.015 (ref 1.005–1.030)
Urobilinogen, UA: 0.2 mg/dL (ref 0.0–1.0)
pH: 7.5 (ref 5.0–8.0)

## 2011-06-20 LAB — LIPASE, BLOOD: Lipase: 35 U/L (ref 11–59)

## 2011-06-20 LAB — PREGNANCY, URINE: Preg Test, Ur: NEGATIVE

## 2011-06-20 MED ORDER — ONDANSETRON HCL 4 MG/2ML IJ SOLN
4.0000 mg | Freq: Once | INTRAMUSCULAR | Status: AC
  Administered 2011-06-20: 4 mg via INTRAVENOUS
  Filled 2011-06-20: qty 2

## 2011-06-20 MED ORDER — ONDANSETRON HCL 8 MG PO TABS: 8.0000 mg | ORAL_TABLET | Freq: Three times a day (TID) | ORAL | Status: AC | PRN

## 2011-06-20 MED ORDER — MORPHINE SULFATE 4 MG/ML IJ SOLN
4.0000 mg | Freq: Once | INTRAMUSCULAR | Status: AC
  Administered 2011-06-20: 4 mg via INTRAVENOUS
  Filled 2011-06-20: qty 1

## 2011-06-20 MED ORDER — PANTOPRAZOLE SODIUM 40 MG IV SOLR
40.0000 mg | Freq: Once | INTRAVENOUS | Status: AC
Start: 2011-06-20 — End: 2011-06-20
  Administered 2011-06-20: 40 mg via INTRAVENOUS
  Filled 2011-06-20: qty 40

## 2011-06-20 MED ORDER — SODIUM CHLORIDE 0.9 % IV BOLUS (SEPSIS)
1000.0000 mL | Freq: Once | INTRAVENOUS | Status: AC
  Administered 2011-06-20: 1000 mL via INTRAVENOUS

## 2011-06-20 NOTE — ED Notes (Signed)
Patient hyperventilating. Patient instructed to slow breathing down with proper breathing techniques. Patient cooperative and attempting to breath slower. IV initiated in L AC.

## 2011-06-20 NOTE — ED Notes (Signed)
Pt states she is hot and cannot cool off and c/o abd pain with vomiting since this morning.

## 2011-06-20 NOTE — ED Provider Notes (Signed)
History    This chart was scribed for Suzi Roots, MD, MD by Smitty Pluck. The patient was seen in room APA04 and the patient's care was started at 7:29AM.   CSN: [id number]  Arrival date & time 06/20/11  0713   First MD Initiated Contact with Patient 06/20/11 [phone]      Chief Complaint  Patient presents with  . Emesis  . Abdominal Pain    (Consider location/radiation/quality/duration/timing/severity/associated sxs/prior treatment) The history is provided by the patient.   Marie Brown is a 21 y.o. female who presents to the Emergency Department complaining of moderate emesis and waxing and waning epigastric pain onset today. Pt reports the vomit is yellow/clear. No bloody or greenish emesis. She has vomited multiple times today. Pt denies fever, cough, sore throat, dysuria, vaginal discharge, constipation, and diarrhea. There is no radiation of pain. Pt denies any changes in diet (last meals were [employer] and home cooked meal). No known ill contacts.  She has had appendectomy. Pt has history of kidney stones. Pt takes Depo-provera and is unsure of last menstrual cycle. No gu c/o.   Past Medical History  Diagnosis Date  . Kidney stones   . Carpal tunnel syndrome     Past Surgical History  Procedure Date  . Appendectomy     Family History  Problem Relation Age of Onset  . Hypertension Mother   . Diabetes Other   . Thyroid disease Other     History  Substance Use Topics  . Smoking status: Current Everyday Smoker -- 0.5 packs/day for 3 years    Types: Cigarettes  . Smokeless tobacco: Never Used  . Alcohol Use: No    OB History    Grav Para Term Preterm Abortions TAB SAB Ect Mult Living                  Review of Systems  Unable to perform ROS All other systems reviewed and are negative.  no flank pain. No hx pud or gallstones. No hx pancreatitis. No cp or sob. No uri c/o.  10 Systems reviewed and are negative for acute change except as noted in the  HPI.  Allergies  Review of patient's allergies indicates no known allergies.  Home Medications   Current Outpatient Rx  Name Route Sig Dispense Refill  . FLUTICASONE PROPIONATE 50 MCG/ACT NA SUSP Nasal Place 2 sprays into the nose daily.      Marland Kitchen HYDROCODONE-ACETAMINOPHEN 7.5-325 MG PO TABS Oral Take 1 tablet by mouth every 6 (six) hours as needed. For pain     . IBUPROFEN 200 MG PO TABS Oral Take 200 mg by mouth as needed. For pain     . MEDROXYPROGESTERONE ACETATE 150 MG/ML IM SUSP Intramuscular Inject 150 mg into the muscle every 3 (three) months.        BP 124/86  Pulse 88  Temp(Src) 97.7 F (36.5 C) (Oral)  Resp 22  Ht 5\' 3"  (1.6 m)  Wt 153 lb (69.4 kg)  BMI 27.10 kg/m2  SpO2 100%  Physical Exam  Nursing note and vitals reviewed. Constitutional: She is oriented to person, place, and time. She appears well-developed and well-nourished. No distress.  HENT:  Head: Normocephalic and atraumatic.  Eyes: EOM are normal. Pupils are equal, round, and reactive to light.  Neck: Normal range of motion. Neck supple. No tracheal deviation present.  Cardiovascular: Normal rate, regular rhythm and normal heart sounds.   Pulmonary/Chest: Effort normal. No respiratory distress.  Abdominal: Soft.  She exhibits no distension. There is Tenderness: epigastric.Marland Kitchen There is no rebound and no guarding.  Musculoskeletal: Normal range of motion.  Neurological: She is alert and oriented to person, place, and time.  Skin: Skin is warm and dry.  Psychiatric: She has a normal mood and affect. Her behavior is normal.    ED Course  Procedures (including critical care time)  DIAGNOSTIC STUDIES: Oxygen Saturation is 100% on room air, normal by my interpretation.    COORDINATION OF CARE: 7:37AM EDP ordered medication: morphine 4 mg/ml, zofran 4 mg, protonix 40 mg 8:01AM EDP Recheck pt and pt is still in pain but symptoms have improved.   Labs Reviewed  URINALYSIS, WITH MICROSCOPIC - Abnormal;  Notable for the following:    Hgb urine dipstick TRACE (*)    All other components within normal limits  COMPREHENSIVE METABOLIC PANEL - Abnormal; Notable for the following:    Potassium 3.4 (*)    Glucose, Bld 142 (*)    All other components within normal limits  PREGNANCY, URINE  CBC  LIPASE, BLOOD   Results for orders placed during the hospital encounter of 06/20/11  URINALYSIS, WITH MICROSCOPIC      Component Value Range   Color, Urine YELLOW  YELLOW    APPearance CLEAR  CLEAR    Specific Gravity, Urine 1.015  1.005 - 1.030    pH 7.5  5.0 - 8.0    Glucose, UA NEGATIVE  NEGATIVE (mg/dL)   Hgb urine dipstick TRACE (*) NEGATIVE    Bilirubin Urine NEGATIVE  NEGATIVE    Ketones, ur NEGATIVE  NEGATIVE (mg/dL)   Protein, ur NEGATIVE  NEGATIVE (mg/dL)   Urobilinogen, UA 0.2  0.0 - 1.0 (mg/dL)   Nitrite NEGATIVE  NEGATIVE    Leukocytes, UA NEGATIVE  NEGATIVE    RBC / HPF 3-6  <3 (RBC/hpf)  PREGNANCY, URINE      Component Value Range   Preg Test, Ur NEGATIVE  NEGATIVE   CBC      Component Value Range   WBC 8.6  4.0 - 10.5 (K/uL)   RBC 4.29  3.87 - 5.11 (MIL/uL)   Hemoglobin 13.0  12.0 - 15.0 (g/dL)   HCT 16.1  09.6 - 04.5 (%)   MCV 92.8  78.0 - 100.0 (fL)   MCH 30.3  26.0 - 34.0 (pg)   MCHC 32.7  30.0 - 36.0 (g/dL)   RDW 40.9  81.1 - 91.4 (%)   Platelets 234  150 - 400 (K/uL)  COMPREHENSIVE METABOLIC PANEL      Component Value Range   Sodium 139  135 - 145 (mEq/L)   Potassium 3.4 (*) 3.5 - 5.1 (mEq/L)   Chloride 103  96 - 112 (mEq/L)   CO2 25  19 - 32 (mEq/L)   Glucose, Bld 142 (*) 70 - 99 (mg/dL)   BUN 13  6 - 23 (mg/dL)   Creatinine, Ser 7.82  0.50 - 1.10 (mg/dL)   Calcium 95.6  8.4 - 10.5 (mg/dL)   Total Protein 7.6  6.0 - 8.3 (g/dL)   Albumin 4.3  3.5 - 5.2 (g/dL)   AST 15  0 - 37 (U/L)   ALT 10  0 - 35 (U/L)   Alkaline Phosphatase 45  39 - 117 (U/L)   Total Bilirubin 0.6  0.3 - 1.2 (mg/dL)   GFR calc non Af Amer >90  >90 (mL/min)   GFR calc Af Amer >90  >90  (mL/min)  LIPASE, BLOOD  Component Value Range   Lipase 35  11 - 59 (U/L)     MDM  Iv ns bolus. zofran iv. Morphine iv. protonix iv.  Labs sent.   Recheck pt improved, but mild nausea persists. zofran iv.  Recheck feels improved. Pt requests work note.       I personally performed the services described in this documentation, which was scribed in my presence. The recorded information has been reviewed and considered. Suzi Roots, MD    Suzi Roots, MD 06/20/11 [phone]

## 2011-06-20 NOTE — ED Notes (Signed)
Patient with no complaints at this time. Respirations even and unlabored. Skin warm/dry. Discharge instructions reviewed with patient at this time. Patient given opportunity to voice concerns/ask questions. IV removed per policy and band-aid applied to site. Patient discharged at this time and left Emergency Department with steady gait.  

## 2011-06-20 NOTE — ED Notes (Addendum)
Patient c/o increasing pain. Dr Denton Lank made aware. Awaiting orders.

## 2011-06-22 ENCOUNTER — Emergency Department (HOSPITAL_COMMUNITY): Payer: Self-pay

## 2011-06-22 ENCOUNTER — Encounter (HOSPITAL_COMMUNITY): Payer: Self-pay

## 2011-06-22 ENCOUNTER — Emergency Department (HOSPITAL_COMMUNITY)
Admission: EM | Admit: 2011-06-22 | Discharge: 2011-06-22 | Disposition: A | Discharge status: Home / Self Care | Payer: Self-pay | Attending: Emergency Medicine | Admitting: Emergency Medicine

## 2011-06-22 DIAGNOSIS — F17200 Nicotine dependence, unspecified, uncomplicated: Secondary | ICD-10-CM | POA: Insufficient documentation

## 2011-06-22 DIAGNOSIS — N390 Urinary tract infection, site not specified: Secondary | ICD-10-CM

## 2011-06-22 DIAGNOSIS — R10819 Abdominal tenderness, unspecified site: Secondary | ICD-10-CM | POA: Insufficient documentation

## 2011-06-22 DIAGNOSIS — E876 Hypokalemia: Secondary | ICD-10-CM | POA: Insufficient documentation

## 2011-06-22 DIAGNOSIS — Z87442 Personal history of urinary calculi: Secondary | ICD-10-CM | POA: Insufficient documentation

## 2011-06-22 DIAGNOSIS — R112 Nausea with vomiting, unspecified: Secondary | ICD-10-CM | POA: Insufficient documentation

## 2011-06-22 DIAGNOSIS — R109 Unspecified abdominal pain: Secondary | ICD-10-CM

## 2011-06-22 DIAGNOSIS — Z9889 Other specified postprocedural states: Secondary | ICD-10-CM | POA: Insufficient documentation

## 2011-06-22 LAB — BASIC METABOLIC PANEL
BUN: 10 mg/dL (ref 6–23)
CO2: 25 mEq/L (ref 19–32)
Calcium: 9.3 mg/dL (ref 8.4–10.5)
Chloride: 104 mEq/L (ref 96–112)
Creatinine, Ser: 0.65 mg/dL (ref 0.50–1.10)
GFR calc Af Amer: >90 mL/min (ref >90)
GFR calc non Af Amer: >90 mL/min (ref >90)
Glucose, Bld: 91 mg/dL (ref 70–99)
Potassium: 3.3 mEq/L — ABNORMAL LOW (ref 3.5–5.1)
Sodium: 138 mEq/L (ref 135–145)

## 2011-06-22 LAB — DIFFERENTIAL
Basophils Absolute: 0 10*3/uL (ref 0.0–0.1)
Basophils Relative: 0 % (ref 0–1)
Eosinophils Absolute: 0 10*3/uL (ref 0.0–0.7)
Eosinophils Relative: 1 % (ref 0–5)
Lymphocytes Relative: 41 % (ref 12–46)
Lymphs Abs: 1.5 10*3/uL (ref 0.7–4.0)
Monocytes Absolute: 0.5 10*3/uL (ref 0.1–1.0)
Monocytes Relative: 13 % — ABNORMAL HIGH (ref 3–12)
Neutro Abs: 1.7 10*3/uL (ref 1.7–7.7)
Neutrophils Relative %: 45 % (ref 43–77)

## 2011-06-22 LAB — URINALYSIS, ROUTINE W REFLEX MICROSCOPIC
Bilirubin Urine: NEGATIVE
Glucose, UA: NEGATIVE mg/dL
Leukocytes, UA: NEGATIVE
Nitrite: NEGATIVE
Protein, ur: NEGATIVE mg/dL
Specific Gravity, Urine: 1.015 (ref 1.005–1.030)
Urobilinogen, UA: 1 mg/dL (ref 0.0–1.0)
pH: 7.5 (ref 5.0–8.0)

## 2011-06-22 LAB — CBC
HCT: 36.4 % (ref 36.0–46.0)
Hemoglobin: 12.4 g/dL (ref 12.0–15.0)
MCH: 31 pg (ref 26.0–34.0)
MCHC: 34.1 g/dL (ref 30.0–36.0)
MCV: 91 fL (ref 78.0–100.0)
Platelets: 208 10*3/uL (ref 150–400)
RBC: 4 MIL/uL (ref 3.87–5.11)
RDW: 12.2 % (ref 11.5–15.5)
WBC: 3.6 10*3/uL — ABNORMAL LOW (ref 4.0–10.5)

## 2011-06-22 LAB — URINE MICROSCOPIC-ADD ON

## 2011-06-22 MED ORDER — PROMETHAZINE HCL 25 MG PO TABS: 12.5000 mg | ORAL_TABLET | Freq: Four times a day (QID) | ORAL | Status: DC | PRN

## 2011-06-22 MED ORDER — NITROFURANTOIN MONOHYD MACRO 100 MG PO CAPS: 100.0000 mg | ORAL_CAPSULE | Freq: Two times a day (BID) | ORAL | Status: AC

## 2011-06-22 MED ORDER — HYDROMORPHONE HCL PF 1 MG/ML IJ SOLN
1.0000 mg | Freq: Once | INTRAMUSCULAR | Status: AC
  Administered 2011-06-22: 1 mg via INTRAVENOUS
  Filled 2011-06-22: qty 1

## 2011-06-22 MED ORDER — METOCLOPRAMIDE HCL 5 MG/ML IJ SOLN
10.0000 mg | Freq: Once | INTRAMUSCULAR | Status: AC
  Administered 2011-06-22: 10 mg via INTRAVENOUS
  Filled 2011-06-22: qty 2

## 2011-06-22 MED ORDER — NITROFURANTOIN MONOHYD MACRO 100 MG PO CAPS
100.0000 mg | ORAL_CAPSULE | Freq: Once | ORAL | Status: AC
  Administered 2011-06-22: 100 mg via ORAL
  Filled 2011-06-22: qty 1

## 2011-06-22 MED ORDER — SODIUM CHLORIDE 0.9 % IV BOLUS (SEPSIS)
1000.0000 mL | Freq: Once | INTRAVENOUS | Status: AC
  Administered 2011-06-22: 1000 mL via INTRAVENOUS

## 2011-06-22 NOTE — ED Notes (Signed)
Rx given x2 D/c instructions reviewed w/ pt and family - pt and family deny any further questions or concerns at present.  

## 2011-06-22 NOTE — ED Notes (Signed)
Pt presents with abdominal pain, vomiting, and weakness since Friday. Pt states she was seen here Friday and diagnosed with Gastroenteritis but symptoms have not improved.

## 2011-06-22 NOTE — ED Provider Notes (Signed)
History     CSN: [id number]  Arrival date & time 06/22/11  1211   First MD Initiated Contact with Patient 06/22/11 1417      Chief Complaint  Patient presents with  . Abdominal Pain  . Emesis  . Weakness    (Consider location/radiation/quality/duration/timing/severity/associated sxs/prior treatment) HPI Marie Brown is a 21 y.o. female who presents to the Emergency Department complaining of abdominal pain and vomiting. Patient was seen in the emergency room on 06/22/2011 for similar complaints. At that time labs were normal she was given analgesics anti-emetics with relief. She reports that since going home she has continued nausea, vomiting, and lower abdominal pain. She has not had a bowel movement since Thursday. She denies fever, chills ,chest pain, or shortness of breath. Past Medical History  Diagnosis Date  . Kidney stones   . Carpal tunnel syndrome     Past Surgical History  Procedure Date  . Appendectomy     Family History  Problem Relation Age of Onset  . Hypertension Mother   . Diabetes Other   . Thyroid disease Other     History  Substance Use Topics  . Smoking status: Current Everyday Smoker -- 0.5 packs/day for 3 years    Types: Cigarettes  . Smokeless tobacco: Never Used  . Alcohol Use: No    OB History    Grav Para Term Preterm Abortions TAB SAB Ect Mult Living                  Review of Systems A 10 review of systems reviewed and are negative for acute change except as noted in the HPI. Allergies  Rubbing alcohol  Home Medications   Current Outpatient Rx  Name Route Sig Dispense Refill  . FLUTICASONE PROPIONATE 50 MCG/ACT NA SUSP Nasal Place 2 sprays into the nose daily.      Marland Kitchen ONDANSETRON HCL 8 MG PO TABS Oral Take 1 tablet (8 mg total) by mouth every 8 (eight) hours as needed for nausea. 8 tablet 0  . MEDROXYPROGESTERONE ACETATE 150 MG/ML IM SUSP Intramuscular Inject 150 mg into the muscle every 3 (three) months.        BP 137/89   Pulse 80  Temp(Src) 98.2 F (36.8 C) (Oral)  Resp 20  Ht 5\' 3"  (1.6 m)  Wt 153 lb (69.4 kg)  BMI 27.10 kg/m2  SpO2 98%  Physical Exam  Nursing note and vitals reviewed. Constitutional: She is oriented to person, place, and time. She appears well-developed and well-nourished.  HENT:  Head: Normocephalic and atraumatic.  Eyes: Conjunctivae and EOM are normal. Pupils are equal, round, and reactive to light.  Neck: Normal range of motion. Neck supple.  Cardiovascular: Normal rate, normal heart sounds and intact distal pulses.   Pulmonary/Chest: Effort normal and breath sounds normal.  Abdominal: Soft. Bowel sounds are normal. She exhibits no distension and no mass. There is tenderness. There is no rebound and no guarding.       Tenderness to the lower abdomen with palpation with no rebound or guarding.  Musculoskeletal: Normal range of motion.  Neurological: She is alert and oriented to person, place, and time. She has normal reflexes.  Skin: Skin is warm and dry.    ED Course  Procedures (including critical care time)  Results for orders placed during the hospital encounter of 06/22/11  CBC      Component Value Range   WBC 3.6 (*) 4.0 - 10.5 (K/uL)   RBC 4.00  3.87 - 5.11 (MIL/uL)   Hemoglobin 12.4  12.0 - 15.0 (g/dL)   HCT 96.2  95.2 - 84.1 (%)   MCV 91.0  78.0 - 100.0 (fL)   MCH 31.0  26.0 - 34.0 (pg)   MCHC 34.1  30.0 - 36.0 (g/dL)   RDW 32.4  40.1 - 02.7 (%)   Platelets 208  150 - 400 (K/uL)  DIFFERENTIAL      Component Value Range   Neutrophils Relative 45  43 - 77 (%)   Neutro Abs 1.7  1.7 - 7.7 (K/uL)   Lymphocytes Relative 41  12 - 46 (%)   Lymphs Abs 1.5  0.7 - 4.0 (K/uL)   Monocytes Relative 13 (*) 3 - 12 (%)   Monocytes Absolute 0.5  0.1 - 1.0 (K/uL)   Eosinophils Relative 1  0 - 5 (%)   Eosinophils Absolute 0.0  0.0 - 0.7 (K/uL)   Basophils Relative 0  0 - 1 (%)   Basophils Absolute 0.0  0.0 - 0.1 (K/uL)  BASIC METABOLIC PANEL      Component Value Range    Sodium 138  135 - 145 (mEq/L)   Potassium 3.3 (*) 3.5 - 5.1 (mEq/L)   Chloride 104  96 - 112 (mEq/L)   CO2 25  19 - 32 (mEq/L)   Glucose, Bld 91  70 - 99 (mg/dL)   BUN 10  6 - 23 (mg/dL)   Creatinine, Ser 2.53  0.50 - 1.10 (mg/dL)   Calcium 9.3  8.4 - 66.4 (mg/dL)   GFR calc non Af Amer >90  >90 (mL/min)   GFR calc Af Amer >90  >90 (mL/min)   Dg Abd Acute W/chest  06/22/2011  *RADIOLOGY REPORT*  Clinical Data: Lower abdominal pain.  ACUTE ABDOMEN SERIES (ABDOMEN 2 VIEW & CHEST 1 VIEW)  Comparison:  11/27/2010  Findings:  There is no evidence of dilated bowel loops or free intraperitoneal air.  No radiopaque calculi or other significant radiographic abnormality is seen. Heart size and mediastinal contours are within normal limits.  Both lungs are clear.  IMPRESSION: Negative abdominal radiographs.  No acute cardiopulmonary disease.  Original Report Authenticated By: Danae Orleans, M.D.     No diagnosis found.    MDM  Patient with nausea, vomiting, and abdominal pain that she has had since Thursday. This is her second visit to the emergency room for the same complaint. Given IV fluids, analgesics, and antiemetics with improvement. Labs are unremarkable except for a slightly low potassium at 3.3. And UTI. Patient received IVF, analgesics, antiemetic with improvement.Initiated antibiotic therapy. Pt feels improved after observation and/or treatment in ED.Pt stable in ED with no significant deterioration in condition.The patient appears reasonably screened and/or stabilized for discharge and I doubt any other medical condition or other [facility] requiring further screening, evaluation, or treatment in the ED at this time prior to discharge.  MDM Reviewed: nursing note, vitals and previous chart Reviewed previous: labs Interpretation: labs         Nicoletta Dress. Colon Branch, MD 06/23/11 4034

## 2011-07-08 ENCOUNTER — Other Ambulatory Visit (HOSPITAL_COMMUNITY)
Admission: RE | Admit: 2011-07-08 | Discharge: 2011-07-08 | Disposition: A | Discharge status: Home / Self Care | Payer: Self-pay | Source: Ambulatory Visit | Attending: Unknown Physician Specialty | Admitting: Unknown Physician Specialty

## 2011-07-08 ENCOUNTER — Other Ambulatory Visit: Payer: Self-pay | Admitting: Nurse Practitioner

## 2011-07-08 DIAGNOSIS — R87619 Unspecified abnormal cytological findings in specimens from cervix uteri: Secondary | ICD-10-CM | POA: Insufficient documentation

## 2011-07-08 DIAGNOSIS — R87612 Low grade squamous intraepithelial lesion on cytologic smear of cervix (LGSIL): Secondary | ICD-10-CM | POA: Insufficient documentation

## 2011-10-15 ENCOUNTER — Encounter (HOSPITAL_COMMUNITY): Payer: Self-pay | Admitting: *Deleted

## 2011-10-15 ENCOUNTER — Emergency Department (HOSPITAL_COMMUNITY): Payer: Self-pay

## 2011-10-15 ENCOUNTER — Emergency Department (HOSPITAL_COMMUNITY)
Admission: EM | Admit: 2011-10-15 | Discharge: 2011-10-16 | Disposition: A | Discharge status: Home / Self Care | Payer: Self-pay | Attending: Emergency Medicine | Admitting: Emergency Medicine

## 2011-10-15 DIAGNOSIS — IMO0002 Reserved for concepts with insufficient information to code with codable children: Secondary | ICD-10-CM | POA: Insufficient documentation

## 2011-10-15 DIAGNOSIS — W108XXA Fall (on) (from) other stairs and steps, initial encounter: Secondary | ICD-10-CM | POA: Insufficient documentation

## 2011-10-15 DIAGNOSIS — S8391XA Sprain of unspecified site of right knee, initial encounter: Secondary | ICD-10-CM

## 2011-10-15 MED ORDER — OXYCODONE-ACETAMINOPHEN 5-325 MG PO TABS
1.0000 | ORAL_TABLET | Freq: Once | ORAL | Status: AC
  Administered 2011-10-15: 1 via ORAL
  Filled 2011-10-15: qty 1

## 2011-10-15 NOTE — ED Provider Notes (Signed)
History    This chart was scribed for Joya Gaskins, MD, MD by Smitty Pluck. The patient was seen in room APA01 and the patient's care was started at 11:37PM.   CSN: [id number]  Arrival date & time 10/15/11  2211   First MD Initiated Contact with Patient 10/15/11 2334      Chief Complaint  Patient presents with  . Knee Pain     The history is provided by the patient.   Marie Brown is a 21 y.o. female who presents to the Emergency Department complaining of moderate right knee pain onset 1.5 weeks ago. Pt reports that standing aggravates the pain. Pt reports that she missed a step causing pain. Reports that pain radiates to legs when she stands. She reports the pain is sharp. Denies falling on knee.pain has been constant since onset.   Past Medical History  Diagnosis Date  . Kidney stones   . Carpal tunnel syndrome     Past Surgical History  Procedure Date  . Appendectomy     Family History  Problem Relation Age of Onset  . Hypertension Mother   . Diabetes Other   . Thyroid disease Other     History  Substance Use Topics  . Smoking status: Current Everyday Smoker -- 0.5 packs/day for 3 years    Types: Cigarettes  . Smokeless tobacco: Never Used  . Alcohol Use: No    OB History    Grav Para Term Preterm Abortions TAB SAB Ect Mult Living                  Review of Systems  Cardiovascular: Negative for chest pain.  Musculoskeletal: Negative for joint swelling.      Allergies  Rubbing alcohol  Home Medications   Current Outpatient Rx  Name Route Sig Dispense Refill  . FLUTICASONE PROPIONATE 50 MCG/ACT NA SUSP Nasal Place 2 sprays into the nose daily.      Marland Kitchen MEDROXYPROGESTERONE ACETATE 150 MG/ML IM SUSP Intramuscular Inject 150 mg into the muscle every 3 (three) months.        BP 139/75  Pulse 85  Temp(Src) 98.5 F (36.9 C) (Oral)  Resp 20  Ht 5\' 3"  (1.6 m)  Wt 158 lb (71.668 kg)  BMI 27.99 kg/m2  SpO2 100%  Physical Exam  Nursing note  and vitals reviewed.  CONSTITUTIONAL: Well developed/well nourished HEAD AND FACE: Normocephalic/atraumatic EYES: EOMI/PERRL ENMT: Mucous membranes moist NECK: supple no meningeal signs SPINE:entire spine nontender CV: S1/S2 noted, no murmurs/rubs/gallops noted LUNGS: Lungs are clear to auscultation bilaterally, no apparent distress ABDOMEN: soft, nontender, no rebound or guarding GU:no cva tenderness NEURO: Pt is awake/alert, moves all extremitiesx4 EXTREMITIES: pulses normal, full ROM, tenderness to right knee diffusely with full ROM, no deformity noted, no right foot/ankle tenderness noted SKIN: warm, color normal PSYCH: no abnormalities of mood noted  ED Course  Procedures  DIAGNOSTIC STUDIES: Oxygen Saturation is 100% on room air, normal by my interpretation.    COORDINATION OF CARE: 11:39PM EDP discusses pt ED treatment with pt 11:45PM EDP orders medication: percocet 325 mg tablet      MDM  Nursing notes reviewed and considered in documentation xrays reviewed and considered  I personally performed the services described in this documentation, which was scribed in my presence. The recorded information has been reviewed and considered.           Joya Gaskins, MD 10/16/11 [phone]

## 2011-10-15 NOTE — ED Notes (Signed)
Pt reporting pain in right knee starting about 1 week ago.  Pt denies any injury to knee that she was aware of.  Pt reports she has been using a brace and cold packs with minimal relief.

## 2011-10-15 NOTE — ED Notes (Addendum)
Right knee pain for 1 1/2 week, states from knee down feels numb and from knee up is a sharp pain, denies any injury to knee

## 2011-10-16 MED ORDER — OXYCODONE-ACETAMINOPHEN 5-325 MG PO TABS: 1.0000 | ORAL_TABLET | ORAL | Status: AC | PRN

## 2011-10-16 NOTE — Discharge Instructions (Signed)
Joint Sprain A sprain is a tear or stretch in the ligaments that hold a joint together. Severe sprains may need as long as 3-6 weeks of immobilization and/or exercises to heal completely. Sprained joints should be rested and protected. If not, they can become unstable and prone to re-injury. Proper treatment can reduce your pain, shorten the period of disability, and reduce the risk of repeated injuries. TREATMENT   Rest and elevate the injured joint to reduce pain and swelling.   Apply ice packs to the injury for 20-30 minutes every 2-3 hours for the next 2-3 days.   Keep the injury wrapped in a compression bandage or splint as long as the joint is painful or as instructed by your caregiver.   Do not use the injured joint until it is completely healed to prevent re-injury and chronic instability. Follow the instructions of your caregiver.   Long-term sprain management may require exercises and/or treatment by a physical therapist. Taping or special braces may help stabilize the joint until it is completely better.  SEEK MEDICAL CARE IF:   You develop increased pain or swelling of the joint.   You develop increasing redness and warmth of the joint.   You develop a fever.   It becomes stiff.   Your hand or foot gets cold or numb.  Document Released: 06/12/2004 Document Revised: 04/24/2011 Document Reviewed: 05/22/2008 [employer] Patient Information 2012 [employer]. 

## 2011-10-16 NOTE — ED Notes (Signed)
Crutch teaching completed.  Pt demonstrated ability to use safely.  Denies relief from pain, but understands that p.o medications may not work in 30 minutes.  Pt denies any additional questions.

## 2011-12-30 ENCOUNTER — Encounter (HOSPITAL_COMMUNITY): Payer: Self-pay

## 2011-12-30 ENCOUNTER — Emergency Department (HOSPITAL_COMMUNITY)
Admission: EM | Admit: 2011-12-30 | Discharge: 2011-12-30 | Disposition: A | Discharge status: Home / Self Care | Payer: Self-pay | Attending: Emergency Medicine | Admitting: Emergency Medicine

## 2011-12-30 DIAGNOSIS — Z9089 Acquired absence of other organs: Secondary | ICD-10-CM | POA: Insufficient documentation

## 2011-12-30 DIAGNOSIS — R109 Unspecified abdominal pain: Secondary | ICD-10-CM | POA: Insufficient documentation

## 2011-12-30 DIAGNOSIS — F17200 Nicotine dependence, unspecified, uncomplicated: Secondary | ICD-10-CM | POA: Insufficient documentation

## 2011-12-30 LAB — URINALYSIS, ROUTINE W REFLEX MICROSCOPIC
Bilirubin Urine: NEGATIVE
Glucose, UA: NEGATIVE mg/dL
Hgb urine dipstick: NEGATIVE
Ketones, ur: NEGATIVE mg/dL
Leukocytes, UA: NEGATIVE
Nitrite: NEGATIVE
Protein, ur: NEGATIVE mg/dL
Specific Gravity, Urine: 1.015 (ref 1.005–1.030)
Urobilinogen, UA: 0.2 mg/dL (ref 0.0–1.0)
pH: 7.5 (ref 5.0–8.0)

## 2011-12-30 LAB — CBC WITH DIFFERENTIAL/PLATELET
Basophils Absolute: 0 10*3/uL (ref 0.0–0.1)
Basophils Relative: 0 % (ref 0–1)
Eosinophils Absolute: 0 10*3/uL (ref 0.0–0.7)
Eosinophils Relative: 1 % (ref 0–5)
HCT: 40.3 % (ref 36.0–46.0)
Hemoglobin: 13.2 g/dL (ref 12.0–15.0)
Lymphocytes Relative: 40 % (ref 12–46)
Lymphs Abs: 1.7 10*3/uL (ref 0.7–4.0)
MCH: 30.1 pg (ref 26.0–34.0)
MCHC: 32.8 g/dL (ref 30.0–36.0)
MCV: 91.8 fL (ref 78.0–100.0)
Monocytes Absolute: 0.4 10*3/uL (ref 0.1–1.0)
Monocytes Relative: 9 % (ref 3–12)
Neutro Abs: 2.1 10*3/uL (ref 1.7–7.7)
Neutrophils Relative %: 50 % (ref 43–77)
Platelets: 223 10*3/uL (ref 150–400)
RBC: 4.39 MIL/uL (ref 3.87–5.11)
RDW: 12.4 % (ref 11.5–15.5)
WBC: 4.2 10*3/uL (ref 4.0–10.5)

## 2011-12-30 LAB — COMPREHENSIVE METABOLIC PANEL
ALT: 11 U/L (ref 0–35)
AST: 16 U/L (ref 0–37)
Albumin: 3.9 g/dL (ref 3.5–5.2)
Alkaline Phosphatase: 46 U/L (ref 39–117)
BUN: 9 mg/dL (ref 6–23)
CO2: 27 mEq/L (ref 19–32)
Calcium: 9.6 mg/dL (ref 8.4–10.5)
Chloride: 104 mEq/L (ref 96–112)
Creatinine, Ser: 0.71 mg/dL (ref 0.50–1.10)
GFR calc Af Amer: >90 mL/min (ref >90)
GFR calc non Af Amer: >90 mL/min (ref >90)
Glucose, Bld: 91 mg/dL (ref 70–99)
Potassium: 3.8 mEq/L (ref 3.5–5.1)
Sodium: 139 mEq/L (ref 135–145)
Total Bilirubin: 0.4 mg/dL (ref 0.3–1.2)
Total Protein: 7 g/dL (ref 6.0–8.3)

## 2011-12-30 LAB — PREGNANCY, URINE: Preg Test, Ur: NEGATIVE

## 2011-12-30 MED ORDER — SODIUM CHLORIDE 0.9 % IV SOLN
Freq: Once | INTRAVENOUS | Status: AC
Start: 2011-12-30 — End: 2011-12-30
  Administered 2011-12-30: 12:00:00 via INTRAVENOUS

## 2011-12-30 MED ORDER — ONDANSETRON HCL 4 MG/2ML IJ SOLN
4.0000 mg | Freq: Once | INTRAMUSCULAR | Status: AC
Start: 2011-12-30 — End: 2011-12-30
  Administered 2011-12-30: 4 mg via INTRAVENOUS
  Filled 2011-12-30: qty 2

## 2011-12-30 MED ORDER — KETOROLAC TROMETHAMINE 30 MG/ML IJ SOLN
30.0000 mg | Freq: Once | INTRAMUSCULAR | Status: AC
  Administered 2011-12-30: 30 mg via INTRAVENOUS
  Filled 2011-12-30: qty 1

## 2011-12-30 MED ORDER — HYDROCODONE-ACETAMINOPHEN 5-325 MG PO TABS: 1.0000 | ORAL_TABLET | Freq: Four times a day (QID) | ORAL | Status: AC | PRN

## 2011-12-30 MED ORDER — HYDROMORPHONE HCL PF 1 MG/ML IJ SOLN
1.0000 mg | Freq: Once | INTRAMUSCULAR | Status: AC
  Administered 2011-12-30: 1 mg via INTRAVENOUS
  Filled 2011-12-30: qty 1

## 2011-12-30 MED ORDER — ONDANSETRON HCL 4 MG/2ML IJ SOLN
4.0000 mg | Freq: Once | INTRAMUSCULAR | Status: AC
  Administered 2011-12-30: 4 mg via INTRAVENOUS
  Filled 2011-12-30: qty 2

## 2011-12-30 MED ORDER — PROMETHAZINE HCL 25 MG PO TABS: 25.0000 mg | ORAL_TABLET | Freq: Four times a day (QID) | ORAL | Status: DC | PRN

## 2011-12-30 NOTE — ED Provider Notes (Signed)
History   This chart was scribed for Marie Lennert, MD by [name] . The patient was seen in room APA11/APA11. Patient's care was started at 1117.    CSN: [id number]  Arrival date & time 12/30/11  1049   First MD Initiated Contact with Patient 12/30/11 1117      Chief Complaint  Patient presents with  . Flank Pain  . Abdominal Pain    (Consider location/radiation/quality/duration/timing/severity/associated sxs/prior treatment) HPI Comments: Marie Brown is a 21 y.o. female who presents to the Emergency Department complaining of constant, moderate, worsening right flank pain with an onset of 2 days ago. Pt report associated n/v and RLQ abdominal pain. Pt states that her flank pain radiates around to her lower abdomin. Pt reports that she has a h/o kidney stones with last stone passing last year. Pt reports decreased PO due to emesis. Not relieved by changing positions.  Patient is a 21 y.o. female presenting with flank pain. The history is provided by the patient.  Flank Pain This is a new problem. The current episode started 2 days ago. The problem occurs constantly. The problem has been gradually worsening. Associated symptoms include abdominal pain. Pertinent negatives include no chest pain and no headaches. Nothing relieves the symptoms. She has tried water (Cranberry juice) for the symptoms. The treatment provided no relief.    Past Medical History  Diagnosis Date  . Kidney stones   . Carpal tunnel syndrome     Past Surgical History  Procedure Date  . Appendectomy     Family History  Problem Relation Age of Onset  . Hypertension Mother   . Diabetes Other   . Thyroid disease Other     History  Substance Use Topics  . Smoking status: Current Everyday Smoker -- 0.5 packs/day for 3 years    Types: Cigarettes  . Smokeless tobacco: Never Used  . Alcohol Use: No    OB History    Grav Para Term Preterm Abortions TAB SAB Ect Mult Living        Review of Systems  Constitutional: Negative for fatigue.  HENT: Negative for congestion, sinus pressure and ear discharge.   Eyes: Negative for discharge.  Respiratory: Negative for cough.   Cardiovascular: Negative for chest pain.  Gastrointestinal: Positive for nausea, vomiting and abdominal pain. Negative for diarrhea.  Genitourinary: Positive for flank pain. Negative for frequency and hematuria.  Musculoskeletal: Negative for back pain.  Skin: Negative for rash.  Neurological: Negative for seizures and headaches.  Hematological: Negative.   Psychiatric/Behavioral: Negative for hallucinations.  All other systems reviewed and are negative.    Allergies  Rubbing alcohol  Home Medications   Current Outpatient Rx  Name Route Sig Dispense Refill  . IBUPROFEN 200 MG PO TABS Oral Take 800 mg by mouth every 6 (six) hours as needed. Pain    . ADULT MULTIVITAMIN W/MINERALS CH Oral Take 1 tablet by mouth daily.    Marland Kitchen MEDROXYPROGESTERONE ACETATE 150 MG/ML IM SUSP Intramuscular Inject 150 mg into the muscle every 3 (three) months.        BP 117/75  Pulse 74  Temp 98.4 F (36.9 C) (Oral)  Resp 18  Ht 5\' 3"  (1.6 m)  Wt 156 lb (70.761 kg)  BMI 27.63 kg/m2  SpO2 100%  Physical Exam  Constitutional: She is oriented to person, place, and time. She appears well-developed.  HENT:  Head: Normocephalic and atraumatic.  Eyes: Conjunctivae and EOM are normal. No scleral icterus.  Neck: Neck supple. No thyromegaly present.  Cardiovascular: Normal rate, regular rhythm and normal heart sounds.  Exam reveals no gallop and no friction rub.   No murmur heard. Pulmonary/Chest: Effort normal and breath sounds normal. No stridor. She has no wheezes. She has no rales. She exhibits no tenderness.  Abdominal: Soft. Bowel sounds are normal. She exhibits no distension. There is tenderness. There is no rebound.       Moderate right flank tenderness.   Musculoskeletal: Normal range of motion. She  exhibits no edema.  Lymphadenopathy:    She has no cervical adenopathy.  Neurological: She is oriented to person, place, and time. Coordination normal.  Skin: No rash noted. No erythema.  Psychiatric: She has a normal mood and affect. Her behavior is normal.    ED Course  Procedures (including critical care time)  DIAGNOSTIC STUDIES: Oxygen Saturation is 100% on room air, normal by my interpretation.    COORDINATION OF CARE:  11:22-Discussed planned course of treatment with the patient including UA and blood work, who is agreeable at this time.   11:30-Medication Orders: Ketorolac (Toradol) 30 mg/mL injection 30 mg-once; 0.9% sodium chloride infusion-once; Ondansetron (Zofran) injection 4 mg-once.   Results for orders placed during the hospital encounter of 12/30/11  PREGNANCY, URINE      Component Value Range   Preg Test, Ur NEGATIVE  NEGATIVE  URINALYSIS, ROUTINE W REFLEX MICROSCOPIC      Component Value Range   Color, Urine YELLOW  YELLOW   APPearance CLEAR  CLEAR   Specific Gravity, Urine 1.015  1.005 - 1.030   pH 7.5  5.0 - 8.0   Glucose, UA NEGATIVE  NEGATIVE mg/dL   Hgb urine dipstick NEGATIVE  NEGATIVE   Bilirubin Urine NEGATIVE  NEGATIVE   Ketones, ur NEGATIVE  NEGATIVE mg/dL   Protein, ur NEGATIVE  NEGATIVE mg/dL   Urobilinogen, UA 0.2  0.0 - 1.0 mg/dL   Nitrite NEGATIVE  NEGATIVE   Leukocytes, UA NEGATIVE  NEGATIVE  CBC WITH DIFFERENTIAL      Component Value Range   WBC 4.2  4.0 - 10.5 K/uL   RBC 4.39  3.87 - 5.11 MIL/uL   Hemoglobin 13.2  12.0 - 15.0 g/dL   HCT 16.1  09.6 - 04.5 %   MCV 91.8  78.0 - 100.0 fL   MCH 30.1  26.0 - 34.0 pg   MCHC 32.8  30.0 - 36.0 g/dL   RDW 40.9  81.1 - 91.4 %   Platelets 223  150 - 400 K/uL   Neutrophils Relative 50  43 - 77 %   Neutro Abs 2.1  1.7 - 7.7 K/uL   Lymphocytes Relative 40  12 - 46 %   Lymphs Abs 1.7  0.7 - 4.0 K/uL   Monocytes Relative 9  3 - 12 %   Monocytes Absolute 0.4  0.1 - 1.0 K/uL   Eosinophils  Relative 1  0 - 5 %   Eosinophils Absolute 0.0  0.0 - 0.7 K/uL   Basophils Relative 0  0 - 1 %   Basophils Absolute 0.0  0.0 - 0.1 K/uL  COMPREHENSIVE METABOLIC PANEL      Component Value Range   Sodium 139  135 - 145 mEq/L   Potassium 3.8  3.5 - 5.1 mEq/L   Chloride 104  96 - 112 mEq/L   CO2 27  19 - 32 mEq/L   Glucose, Bld 91  70 - 99 mg/dL   BUN 9  6 -  23 mg/dL   Creatinine, Ser 7.82  0.50 - 1.10 mg/dL   Calcium 9.6  8.4 - 95.6 mg/dL   Total Protein 7.0  6.0 - 8.3 g/dL   Albumin 3.9  3.5 - 5.2 g/dL   AST 16  0 - 37 U/L   ALT 11  0 - 35 U/L   Alkaline Phosphatase 46  39 - 117 U/L   Total Bilirubin 0.4  0.3 - 1.2 mg/dL   GFR calc non Af Amer >90  >90 mL/min   GFR calc Af Amer >90  >90 mL/min     No results found.   No diagnosis found.    MDM  Normal ua and labs.  Probable kidney stone  The chart was scribed for me under my direct supervision.  I personally performed the history, physical, and medical decision making and all procedures in the evaluation of this patient.Marie Lennert, MD 12/30/11 [phone]

## 2011-12-30 NOTE — ED Notes (Signed)
Pt C/O r flank pain radiating around to abd with n/v x 2 days.  Reports history of kidney stones.

## 2012-05-25 ENCOUNTER — Other Ambulatory Visit (HOSPITAL_COMMUNITY)
Admission: RE | Admit: 2012-05-25 | Discharge: 2012-05-25 | Disposition: A | Discharge status: Home / Self Care | Payer: Self-pay | Source: Ambulatory Visit | Attending: Unknown Physician Specialty | Admitting: Unknown Physician Specialty

## 2012-05-25 DIAGNOSIS — R87612 Low grade squamous intraepithelial lesion on cytologic smear of cervix (LGSIL): Secondary | ICD-10-CM | POA: Insufficient documentation

## 2012-05-25 DIAGNOSIS — R87619 Unspecified abnormal cytological findings in specimens from cervix uteri: Secondary | ICD-10-CM | POA: Insufficient documentation

## 2012-05-31 ENCOUNTER — Emergency Department (HOSPITAL_COMMUNITY): Payer: Self-pay

## 2012-05-31 ENCOUNTER — Emergency Department (HOSPITAL_COMMUNITY)
Admission: EM | Admit: 2012-05-31 | Discharge: 2012-05-31 | Disposition: A | Discharge status: Home / Self Care | Payer: Self-pay | Attending: Emergency Medicine | Admitting: Emergency Medicine

## 2012-05-31 ENCOUNTER — Encounter (HOSPITAL_COMMUNITY): Payer: Self-pay | Admitting: *Deleted

## 2012-05-31 DIAGNOSIS — Z9089 Acquired absence of other organs: Secondary | ICD-10-CM | POA: Insufficient documentation

## 2012-05-31 DIAGNOSIS — Z8739 Personal history of other diseases of the musculoskeletal system and connective tissue: Secondary | ICD-10-CM | POA: Insufficient documentation

## 2012-05-31 DIAGNOSIS — Z79899 Other long term (current) drug therapy: Secondary | ICD-10-CM | POA: Insufficient documentation

## 2012-05-31 DIAGNOSIS — Z87442 Personal history of urinary calculi: Secondary | ICD-10-CM | POA: Insufficient documentation

## 2012-05-31 DIAGNOSIS — R112 Nausea with vomiting, unspecified: Secondary | ICD-10-CM | POA: Insufficient documentation

## 2012-05-31 DIAGNOSIS — F17200 Nicotine dependence, unspecified, uncomplicated: Secondary | ICD-10-CM | POA: Insufficient documentation

## 2012-05-31 DIAGNOSIS — R109 Unspecified abdominal pain: Secondary | ICD-10-CM | POA: Insufficient documentation

## 2012-05-31 DIAGNOSIS — Z3202 Encounter for pregnancy test, result negative: Secondary | ICD-10-CM | POA: Insufficient documentation

## 2012-05-31 DIAGNOSIS — R300 Dysuria: Secondary | ICD-10-CM | POA: Insufficient documentation

## 2012-05-31 DIAGNOSIS — R6883 Chills (without fever): Secondary | ICD-10-CM | POA: Insufficient documentation

## 2012-05-31 LAB — CBC
HCT: 43.3 % (ref 36.0–46.0)
Hemoglobin: 14.3 g/dL (ref 12.0–15.0)
MCH: 30.8 pg (ref 26.0–34.0)
MCHC: 33 g/dL (ref 30.0–36.0)
MCV: 93.3 fL (ref 78.0–100.0)
Platelets: 266 10*3/uL (ref 150–400)
RBC: 4.64 MIL/uL (ref 3.87–5.11)
RDW: 12.8 % (ref 11.5–15.5)
WBC: 5.9 10*3/uL (ref 4.0–10.5)

## 2012-05-31 LAB — URINALYSIS, ROUTINE W REFLEX MICROSCOPIC
Bilirubin Urine: NEGATIVE
Glucose, UA: NEGATIVE mg/dL
Hgb urine dipstick: NEGATIVE
Ketones, ur: NEGATIVE mg/dL
Leukocytes, UA: NEGATIVE
Nitrite: NEGATIVE
Protein, ur: NEGATIVE mg/dL
Specific Gravity, Urine: 1.02 (ref 1.005–1.030)
Urobilinogen, UA: 0.2 mg/dL (ref 0.0–1.0)
pH: 6 (ref 5.0–8.0)

## 2012-05-31 LAB — PREGNANCY, URINE: Preg Test, Ur: NEGATIVE

## 2012-05-31 LAB — WET PREP, GENITAL
Trich, Wet Prep: NONE SEEN
Yeast Wet Prep HPF POC: NONE SEEN

## 2012-05-31 LAB — BASIC METABOLIC PANEL
BUN: 13 mg/dL (ref 6–23)
CO2: 28 mEq/L (ref 19–32)
Calcium: 9.8 mg/dL (ref 8.4–10.5)
Chloride: 104 mEq/L (ref 96–112)
Creatinine, Ser: 0.69 mg/dL (ref 0.50–1.10)
GFR calc Af Amer: >90 mL/min (ref >90)
GFR calc non Af Amer: >90 mL/min (ref >90)
Glucose, Bld: 108 mg/dL — ABNORMAL HIGH (ref 70–99)
Potassium: 4 mEq/L (ref 3.5–5.1)
Sodium: 141 mEq/L (ref 135–145)

## 2012-05-31 MED ORDER — TRAMADOL HCL 50 MG PO TABS: 50.0000 mg | ORAL_TABLET | Freq: Four times a day (QID) | ORAL | Status: DC | PRN

## 2012-05-31 NOTE — ED Notes (Signed)
Patient with no complaints at this time. Respirations even and unlabored. Skin warm/dry. Discharge instructions reviewed with patient at this time. Patient given opportunity to voice concerns/ask questions. Patient discharged at this time and left Emergency Department with steady gait.   

## 2012-05-31 NOTE — ED Provider Notes (Signed)
History  This chart was scribed for Marie Hutching, MD by Ardeen Jourdain, ED Scribe. This patient was seen in room APA11/APA11 and the patient's care was started at 1354.  CSN: [id number]  Arrival date & time 05/31/12  1157   First MD Initiated Contact with Patient 05/31/12 1354      Chief Complaint  Patient presents with  . Emesis  . Abdominal Pain     The history is provided by the patient. No language interpreter was used.    Marie Brown is a 22 y.o. female who presents to the Emergency Department complaining of emesis with associated lower abdominal pain, nausea and chills. She is also c/o a burning sensation while urinating and a malodorous vaginal discharge that is brown and "looked like dead skin." She states the emesis and chills began today. She has had 4 episodes of emesis today. She states she has a h/o abnormal pap smears. She reports she had a colposcopy Tuesday. She also reports she has had another colposcopy in the past.  Past Medical History  Diagnosis Date  . Kidney stones   . Carpal tunnel syndrome     Past Surgical History  Procedure Date  . Appendectomy     Family History  Problem Relation Age of Onset  . Hypertension Mother   . Diabetes Other   . Thyroid disease Other     History  Substance Use Topics  . Smoking status: Current Every Day Smoker -- 0.5 packs/day for 3 years    Types: Cigarettes  . Smokeless tobacco: Never Used  . Alcohol Use: No   No OB history available.   Review of Systems  All other systems reviewed and are negative.   A complete 10 system review of systems was obtained and all systems are negative except as noted in the HPI and PMH.    Allergies  Rubbing alcohol  Home Medications   Current Outpatient Rx  Name  Route  Sig  Dispense  Refill  . ARIPIPRAZOLE 10 MG PO TABS   Oral   Take 20 mg by mouth daily.         . IBUPROFEN 200 MG PO TABS   Oral   Take 800 mg by mouth every 6 (six) hours as needed. Pain        . ADULT MULTIVITAMIN W/MINERALS CH   Oral   Take 1 tablet by mouth daily.         Marland Kitchen MEDROXYPROGESTERONE ACETATE 150 MG/ML IM SUSP   Intramuscular   Inject 150 mg into the muscle every 3 (three) months.           Marland Kitchen PROMETHAZINE HCL 25 MG PO TABS   Oral   Take 0.5 tablets (12.5 mg total) by mouth every 6 (six) hours as needed for nausea.   10 tablet   0     Triage Vitals: BP 131/81  Pulse 87  Temp 98.1 F (36.7 C) (Oral)  Resp 20  Ht 5' 2.5" (1.588 m)  Wt 170 lb (77.111 kg)  BMI 30.60 kg/m2  SpO2 100%  Physical Exam  Nursing note and vitals reviewed. Constitutional: She is oriented to person, place, and time. She appears well-developed and well-nourished. No distress.  HENT:  Head: Normocephalic and atraumatic.  Eyes: EOM are normal. Pupils are equal, round, and reactive to light.  Neck: Normal range of motion. Neck supple. No tracheal deviation present.  Cardiovascular: Normal rate.   Pulmonary/Chest: Effort normal. No respiratory distress.  Abdominal:  Soft. She exhibits no distension.       Minimal suprapubic tenderness   Genitourinary:       Normal external genitalia. Cervix is minimally friable with minimal bleeding.   slight right adnexal tenderness. No masses  Musculoskeletal: Normal range of motion. She exhibits no edema.  Neurological: She is alert and oriented to person, place, and time.  Skin: Skin is warm and dry.  Psychiatric: She has a normal mood and affect. Her behavior is normal.    ED Course  Procedures (including critical care time)  DIAGNOSTIC STUDIES: Oxygen Saturation is 100% on room air, normal by my interpretation.    COORDINATION OF CARE:  2:32 PM: Discussed treatment plan which includes a pelvic exam with pt at bedside and pt agreed to plan.    Results for orders placed during the hospital encounter of 05/31/12  URINALYSIS, ROUTINE W REFLEX MICROSCOPIC      Component Value Range   Color, Urine YELLOW  YELLOW   APPearance  CLEAR  CLEAR   Specific Gravity, Urine 1.020  1.005 - 1.030   pH 6.0  5.0 - 8.0   Glucose, UA NEGATIVE  NEGATIVE mg/dL   Hgb urine dipstick NEGATIVE  NEGATIVE   Bilirubin Urine NEGATIVE  NEGATIVE   Ketones, ur NEGATIVE  NEGATIVE mg/dL   Protein, ur NEGATIVE  NEGATIVE mg/dL   Urobilinogen, UA 0.2  0.0 - 1.0 mg/dL   Nitrite NEGATIVE  NEGATIVE   Leukocytes, UA NEGATIVE  NEGATIVE  PREGNANCY, URINE      Component Value Range   Preg Test, Ur NEGATIVE  NEGATIVE  CBC      Component Value Range   WBC 5.9  4.0 - 10.5 K/uL   RBC 4.64  3.87 - 5.11 MIL/uL   Hemoglobin 14.3  12.0 - 15.0 g/dL   HCT 16.1  09.6 - 04.5 %   MCV 93.3  78.0 - 100.0 fL   MCH 30.8  26.0 - 34.0 pg   MCHC 33.0  30.0 - 36.0 g/dL   RDW 40.9  81.1 - 91.4 %   Platelets 266  150 - 400 K/uL  BASIC METABOLIC PANEL      Component Value Range   Sodium 141  135 - 145 mEq/L   Potassium 4.0  3.5 - 5.1 mEq/L   Chloride 104  96 - 112 mEq/L   CO2 28  19 - 32 mEq/L   Glucose, Bld 108 (*) 70 - 99 mg/dL   BUN 13  6 - 23 mg/dL   Creatinine, Ser 7.82  0.50 - 1.10 mg/dL   Calcium 9.8  8.4 - 95.6 mg/dL   GFR calc non Af Amer >90  >90 mL/min   GFR calc Af Amer >90  >90 mL/min   No results found.    No diagnosis found.    MDM        I personally performed the services described in this documentation, which was scribed in my presence. The recorded information has been reviewed and is accurate.    Marie Hutching, MD 05/31/12 [phone]

## 2012-05-31 NOTE — ED Notes (Signed)
Patient given a Sprite  

## 2012-05-31 NOTE — ED Notes (Signed)
Pt states pain to lower abdomen also states cervical biopsy was done last Tuesday but pain went away. States today, she developed vomiting (x 4), chills and lower abdominal pain.

## 2012-06-01 LAB — GC/CHLAMYDIA PROBE AMP
CT Probe RNA: NEGATIVE
GC Probe RNA: NEGATIVE

## 2012-08-17 ENCOUNTER — Emergency Department (HOSPITAL_COMMUNITY)
Admission: EM | Admit: 2012-08-17 | Discharge: 2012-08-17 | Disposition: A | Discharge status: Home / Self Care | Payer: Self-pay | Attending: Emergency Medicine | Admitting: Emergency Medicine

## 2012-08-17 ENCOUNTER — Encounter (HOSPITAL_COMMUNITY): Payer: Self-pay | Admitting: Emergency Medicine

## 2012-08-17 DIAGNOSIS — D259 Leiomyoma of uterus, unspecified: Secondary | ICD-10-CM | POA: Insufficient documentation

## 2012-08-17 DIAGNOSIS — Z87442 Personal history of urinary calculi: Secondary | ICD-10-CM | POA: Insufficient documentation

## 2012-08-17 DIAGNOSIS — F17200 Nicotine dependence, unspecified, uncomplicated: Secondary | ICD-10-CM | POA: Insufficient documentation

## 2012-08-17 DIAGNOSIS — R109 Unspecified abdominal pain: Secondary | ICD-10-CM

## 2012-08-17 DIAGNOSIS — B9689 Other specified bacterial agents as the cause of diseases classified elsewhere: Secondary | ICD-10-CM

## 2012-08-17 DIAGNOSIS — N898 Other specified noninflammatory disorders of vagina: Secondary | ICD-10-CM | POA: Insufficient documentation

## 2012-08-17 DIAGNOSIS — Z79899 Other long term (current) drug therapy: Secondary | ICD-10-CM | POA: Insufficient documentation

## 2012-08-17 DIAGNOSIS — N760 Acute vaginitis: Secondary | ICD-10-CM | POA: Insufficient documentation

## 2012-08-17 DIAGNOSIS — R300 Dysuria: Secondary | ICD-10-CM

## 2012-08-17 DIAGNOSIS — R112 Nausea with vomiting, unspecified: Secondary | ICD-10-CM | POA: Insufficient documentation

## 2012-08-17 LAB — BASIC METABOLIC PANEL
BUN: 9 mg/dL (ref 6–23)
CO2: 26 mEq/L (ref 19–32)
Calcium: 9.4 mg/dL (ref 8.4–10.5)
Chloride: 104 mEq/L (ref 96–112)
Creatinine, Ser: 0.75 mg/dL (ref 0.50–1.10)
GFR calc Af Amer: >90 mL/min (ref >90)
GFR calc non Af Amer: >90 mL/min (ref >90)
Glucose, Bld: 104 mg/dL — ABNORMAL HIGH (ref 70–99)
Potassium: 3.8 mEq/L (ref 3.5–5.1)
Sodium: 139 mEq/L (ref 135–145)

## 2012-08-17 LAB — CBC WITH DIFFERENTIAL/PLATELET
Basophils Absolute: 0 10*3/uL (ref 0.0–0.1)
Basophils Relative: 0 % (ref 0–1)
Eosinophils Absolute: 0 10*3/uL (ref 0.0–0.7)
Eosinophils Relative: 0 % (ref 0–5)
HCT: 40.2 % (ref 36.0–46.0)
Hemoglobin: 13.4 g/dL (ref 12.0–15.0)
Lymphocytes Relative: 28 % (ref 12–46)
Lymphs Abs: 1.4 10*3/uL (ref 0.7–4.0)
MCH: 31 pg (ref 26.0–34.0)
MCHC: 33.3 g/dL (ref 30.0–36.0)
MCV: 93.1 fL (ref 78.0–100.0)
Monocytes Absolute: 0.3 10*3/uL (ref 0.1–1.0)
Monocytes Relative: 6 % (ref 3–12)
Neutro Abs: 3.3 10*3/uL (ref 1.7–7.7)
Neutrophils Relative %: 66 % (ref 43–77)
Platelets: 227 10*3/uL (ref 150–400)
RBC: 4.32 MIL/uL (ref 3.87–5.11)
RDW: 13.3 % (ref 11.5–15.5)
WBC: 5.1 10*3/uL (ref 4.0–10.5)

## 2012-08-17 LAB — URINALYSIS, ROUTINE W REFLEX MICROSCOPIC
Bilirubin Urine: NEGATIVE
Glucose, UA: NEGATIVE mg/dL
Hgb urine dipstick: NEGATIVE
Ketones, ur: NEGATIVE mg/dL
Leukocytes, UA: NEGATIVE
Nitrite: NEGATIVE
Protein, ur: NEGATIVE mg/dL
Specific Gravity, Urine: <1.005 — ABNORMAL LOW (ref 1.005–1.030)
Urobilinogen, UA: 0.2 mg/dL (ref 0.0–1.0)
pH: 6.5 (ref 5.0–8.0)

## 2012-08-17 LAB — WET PREP, GENITAL
Trich, Wet Prep: NONE SEEN
WBC, Wet Prep HPF POC: NONE SEEN
Yeast Wet Prep HPF POC: NONE SEEN

## 2012-08-17 LAB — PREGNANCY, URINE: Preg Test, Ur: NEGATIVE

## 2012-08-17 LAB — RPR: RPR Ser Ql: NONREACTIVE

## 2012-08-17 MED ORDER — OXYCODONE-ACETAMINOPHEN 5-325 MG PO TABS: 1.0000 | ORAL_TABLET | ORAL | Status: DC | PRN

## 2012-08-17 MED ORDER — METRONIDAZOLE 500 MG PO TABS: 500.0000 mg | ORAL_TABLET | Freq: Two times a day (BID) | ORAL | Status: DC

## 2012-08-17 MED ORDER — SODIUM CHLORIDE 0.9 % IV SOLN
Freq: Once | INTRAVENOUS | Status: AC
  Administered 2012-08-17: 12:00:00 via INTRAVENOUS

## 2012-08-17 MED ORDER — KETOROLAC TROMETHAMINE 30 MG/ML IJ SOLN
30.0000 mg | Freq: Once | INTRAMUSCULAR | Status: AC
  Administered 2012-08-17: 30 mg via INTRAVENOUS
  Filled 2012-08-17: qty 1

## 2012-08-17 MED ORDER — PROMETHAZINE HCL 25 MG PO TABS: 25.0000 mg | ORAL_TABLET | Freq: Four times a day (QID) | ORAL | Status: DC | PRN

## 2012-08-17 MED ORDER — ONDANSETRON HCL 4 MG/2ML IJ SOLN
4.0000 mg | Freq: Once | INTRAMUSCULAR | Status: AC
  Administered 2012-08-17: 4 mg via INTRAVENOUS
  Filled 2012-08-17: qty 2

## 2012-08-17 MED ORDER — MORPHINE SULFATE 4 MG/ML IJ SOLN
4.0000 mg | Freq: Once | INTRAMUSCULAR | Status: AC
  Administered 2012-08-17: 4 mg via INTRAVENOUS
  Filled 2012-08-17: qty 1

## 2012-08-17 NOTE — ED Provider Notes (Signed)
History     CSN: [id number]  Arrival date & time 08/17/12  1036   First MD Initiated Contact with Patient 08/17/12 1124      Chief Complaint  Patient presents with  . Dysuria  . Back Pain  . Nausea    (Consider location/radiation/quality/duration/timing/severity/associated sxs/prior treatment) HPI Comments: Marie Brown is a 22 y.o. Female presenting with bilateral low back pain, right greater than left, Painful urination and right lower abdominal and pelvic pain for the past 2 days.  She has a history of kidney stones and she states this feels like she is passing a stone.  She denies hematuria, but reports pain in her lower back and her lower abdomen with urination.  She denies fevers and chills.  She had one episode of emesis yesterday,  Today only persistent nausea.  She denies vaginal discharge and pelvic cramping.  She has a known small uterine fibroid,  And is on depo for contraception, and has been more than 1 years since her last menses.  Her surgical history is significant for appendectomy.  She denies diarrhea or constipation,  Her last bm was yesterday and normal.  She has taken tylenol without relief.  Nothing makes her pain better or worse,  It is not positional or worsened with movement and not better at rest.     The history is provided by the patient.    Past Medical History  Diagnosis Date  . Kidney stones   . Carpal tunnel syndrome     Past Surgical History  Procedure Laterality Date  . Appendectomy      Family History  Problem Relation Age of Onset  . Hypertension Mother   . Diabetes Other   . Thyroid disease Other     History  Substance Use Topics  . Smoking status: Current Every Day Smoker -- 0.50 packs/day for 3 years    Types: Cigarettes  . Smokeless tobacco: Never Used  . Alcohol Use: No    OB History   Grav Para Term Preterm Abortions TAB SAB Ect Mult Living                  Review of Systems  Constitutional: Negative for fever and  chills.  HENT: Negative for congestion, sore throat and neck pain.   Eyes: Negative.   Respiratory: Negative for chest tightness and shortness of breath.   Cardiovascular: Negative for chest pain.  Gastrointestinal: Positive for nausea and vomiting. Negative for abdominal pain, diarrhea and constipation.  Genitourinary: Positive for dysuria and flank pain. Negative for frequency, hematuria, vaginal discharge, menstrual problem and pelvic pain.  Musculoskeletal: Negative for joint swelling and arthralgias.  Skin: Negative.  Negative for rash and wound.  Neurological: Negative for dizziness, weakness, light-headedness, numbness and headaches.  Psychiatric/Behavioral: Negative.     Allergies  Rubbing alcohol  Home Medications   Current Outpatient Rx  Name  Route  Sig  Dispense  Refill  . acetaminophen (TYLENOL) 500 MG tablet   Oral   Take 1,500 mg by mouth every 6 (six) hours as needed for pain.         Marland Kitchen lamoTRIgine (LAMICTAL) 25 MG tablet   Oral   Take 25 mg by mouth daily.         . medroxyPROGESTERone (DEPO-PROVERA) 150 MG/ML injection   Intramuscular   Inject 150 mg into the muscle every 3 (three) months.           . Multiple Vitamin (MULTIVITAMIN WITH MINERALS) TABS  Oral   Take 1 tablet by mouth daily.         . metroNIDAZOLE (FLAGYL) 500 MG tablet   Oral   Take 1 tablet (500 mg total) by mouth 2 (two) times daily.   14 tablet   0   . oxyCODONE-acetaminophen (PERCOCET/ROXICET) 5-325 MG per tablet   Oral   Take 1 tablet by mouth every 4 (four) hours as needed for pain.   15 tablet   0   . promethazine (PHENERGAN) 25 MG tablet   Oral   Take 1 tablet (25 mg total) by mouth every 6 (six) hours as needed for nausea.   12 tablet   0     BP 120/76  Pulse 87  Temp(Src) 98.3 F (36.8 C) (Oral)  Resp 18  Ht 5\' 3"  (1.6 m)  Wt 165 lb (74.844 kg)  BMI 29.24 kg/m2  SpO2 100%  Physical Exam  Nursing note and vitals reviewed. Constitutional: She  appears well-developed and well-nourished.  HENT:  Head: Normocephalic and atraumatic.  Eyes: Conjunctivae are normal.  Neck: Normal range of motion.  Cardiovascular: Normal rate, regular rhythm, normal heart sounds and intact distal pulses.   Pulmonary/Chest: Effort normal and breath sounds normal. She has no wheezes.  Abdominal: Soft. Bowel sounds are normal. She exhibits no distension. There is no tenderness. There is no rebound and no guarding.  Genitourinary: Uterus normal. Cervix exhibits no motion tenderness and no friability. Right adnexum displays no mass, no tenderness and no fullness. Left adnexum displays no mass, no tenderness and no fullness. Vaginal discharge found.  Musculoskeletal: Normal range of motion. She exhibits tenderness.  ttp right lower flank.  No midline pain.  Neurological: She is alert.  Skin: Skin is warm and dry.  Psychiatric: She has a normal mood and affect.    ED Course  Procedures (including critical care time)  Labs Reviewed  WET PREP, GENITAL - Abnormal; Notable for the following:    Clue Cells Wet Prep HPF POC MANY (*)    All other components within normal limits  URINALYSIS, ROUTINE W REFLEX MICROSCOPIC - Abnormal; Notable for the following:    Specific Gravity, Urine <1.005 (*)    All other components within normal limits  BASIC METABOLIC PANEL - Abnormal; Notable for the following:    Glucose, Bld 104 (*)    All other components within normal limits  GC/CHLAMYDIA PROBE AMP  PREGNANCY, URINE  CBC WITH DIFFERENTIAL  RPR   No results found.   1. Bilateral flank pain   2. Dysuria   3. Bacterial vaginosis       MDM  [place] from 1/14 reviewed,  Small leiomyoma, otherwise normal.  CT scan from 2012 with punctate right renal stones.  Exam and sx today did not dictate need for repeat Ct scan,  However,  Pt was encouraged to continue straining her urine as I suspect she does have a right ureteral stone.  Pt encouraged to increase fluid intake,   Oxycodone, phenergan prescribed.  Also prescribed flagyl for BV , which I informed pt is probably not the source of her pain.  Encouraged to f/u with Dr Jerre Simon for recheck.  In the interim,  Return here for worse pain,  Fever or any new sx.  The patient appears reasonably screened and/or stabilized for discharge and I doubt any other medical condition or other [facility] requiring further screening, evaluation, or treatment in the ED at this time prior to discharge.  Burgess Amor, PA-C 08/17/12 1500  Burgess Amor, PA-C 08/17/12 1512

## 2012-08-17 NOTE — ED Notes (Signed)
Pt c/o bilateral lower abd pain radiating around to groin since SUnday.  Reprots vomited SUnday but only nauseated today.  C/O painful urination.  PT has history of kidney stones.

## 2012-08-17 NOTE — ED Provider Notes (Signed)
Medical screening examination/treatment/procedure(s) were performed by non-physician practitioner and as supervising physician I was immediately available for consultation/collaboration. Devoria Albe, MD, Armando Gang   Ward Givens, MD 08/17/12 [phone]

## 2012-08-17 NOTE — ED Notes (Signed)
Pt states lower abd pain, painful urination, lower back pain for few days.

## 2012-08-18 LAB — GC/CHLAMYDIA PROBE AMP
CT Probe RNA: NEGATIVE
GC Probe RNA: NEGATIVE

## 2012-08-30 ENCOUNTER — Emergency Department (HOSPITAL_COMMUNITY)
Admission: EM | Admit: 2012-08-30 | Discharge: 2012-08-30 | Disposition: A | Discharge status: Home / Self Care | Payer: Self-pay | Attending: Emergency Medicine | Admitting: Emergency Medicine

## 2012-08-30 ENCOUNTER — Encounter (HOSPITAL_COMMUNITY): Payer: Self-pay | Admitting: *Deleted

## 2012-08-30 ENCOUNTER — Emergency Department (HOSPITAL_COMMUNITY): Payer: Self-pay

## 2012-08-30 DIAGNOSIS — Z9089 Acquired absence of other organs: Secondary | ICD-10-CM | POA: Insufficient documentation

## 2012-08-30 DIAGNOSIS — Z79899 Other long term (current) drug therapy: Secondary | ICD-10-CM | POA: Insufficient documentation

## 2012-08-30 DIAGNOSIS — Z8619 Personal history of other infectious and parasitic diseases: Secondary | ICD-10-CM | POA: Insufficient documentation

## 2012-08-30 DIAGNOSIS — Z8669 Personal history of other diseases of the nervous system and sense organs: Secondary | ICD-10-CM | POA: Insufficient documentation

## 2012-08-30 DIAGNOSIS — Z87442 Personal history of urinary calculi: Secondary | ICD-10-CM | POA: Insufficient documentation

## 2012-08-30 DIAGNOSIS — Z3202 Encounter for pregnancy test, result negative: Secondary | ICD-10-CM | POA: Insufficient documentation

## 2012-08-30 DIAGNOSIS — R109 Unspecified abdominal pain: Secondary | ICD-10-CM

## 2012-08-30 DIAGNOSIS — F17200 Nicotine dependence, unspecified, uncomplicated: Secondary | ICD-10-CM | POA: Insufficient documentation

## 2012-08-30 LAB — URINALYSIS, ROUTINE W REFLEX MICROSCOPIC
Bilirubin Urine: NEGATIVE
Glucose, UA: NEGATIVE mg/dL
Hgb urine dipstick: NEGATIVE
Ketones, ur: NEGATIVE mg/dL
Leukocytes, UA: NEGATIVE
Nitrite: NEGATIVE
Protein, ur: NEGATIVE mg/dL
Specific Gravity, Urine: >1.03 — ABNORMAL HIGH (ref 1.005–1.030)
Urobilinogen, UA: 0.2 mg/dL (ref 0.0–1.0)
pH: 6 (ref 5.0–8.0)

## 2012-08-30 LAB — PREGNANCY, URINE: Preg Test, Ur: NEGATIVE

## 2012-08-30 MED ORDER — KETOROLAC TROMETHAMINE 60 MG/2ML IM SOLN
60.0000 mg | Freq: Once | INTRAMUSCULAR | Status: AC
  Administered 2012-08-30: 60 mg via INTRAMUSCULAR
  Filled 2012-08-30: qty 2

## 2012-08-30 NOTE — ED Notes (Signed)
Pt reports being diagnosed with kidney stones and bacterial vaginal infection on 4/1.  Reports worsening pain in lower back that woke her from sleep.  Pt reports completing course of prescribed antibiotics.

## 2012-08-30 NOTE — ED Notes (Signed)
Patient with no complaints at this time. Respirations even and unlabored. Skin warm/dry. Discharge instructions reviewed with patient at this time. Patient given opportunity to voice concerns/ask questions. Patient discharged at this time and left Emergency Department with steady gait.   

## 2012-08-30 NOTE — ED Provider Notes (Signed)
History     CSN: [id number]  Arrival date & time 08/30/12  4098   First MD Initiated Contact with Patient 08/30/12 [phone]      Chief Complaint  Patient presents with  . Nephrolithiasis    (Consider location/radiation/quality/duration/timing/severity/associated sxs/prior treatment) HPI Comments: Patient presents with pain in the right flank and right lower abdomen.  She has a history of kidney stones.  Was seen here for similar complaints several days ago and was treated for bv with flagyl and given pain meds for presumed renal calculi.  She has taken all of her percocet but continues to have the pain in the right flank and right groin.  No fevers or chills.  No dysuria or hematuria.  Patient is a 22 y.o. female presenting with flank pain. The history is provided by the patient.  Flank Pain This is a new problem. Episode onset: 2 weeks ago. The problem occurs constantly. The problem has not changed since onset.Associated symptoms include abdominal pain. Nothing aggravates the symptoms. Nothing relieves the symptoms.    Past Medical History  Diagnosis Date  . Kidney stones   . Carpal tunnel syndrome     Past Surgical History  Procedure Laterality Date  . Appendectomy      Family History  Problem Relation Age of Onset  . Hypertension Mother   . Diabetes Other   . Thyroid disease Other     History  Substance Use Topics  . Smoking status: Current Every Day Smoker -- 0.50 packs/day for 3 years    Types: Cigarettes  . Smokeless tobacco: Never Used  . Alcohol Use: No    OB History   Grav Para Term Preterm Abortions TAB SAB Ect Mult Living                  Review of Systems  Gastrointestinal: Positive for abdominal pain.  Genitourinary: Positive for flank pain.  All other systems reviewed and are negative.    Allergies  Rubbing alcohol  Home Medications   Current Outpatient Rx  Name  Route  Sig  Dispense  Refill  . acetaminophen (TYLENOL) 500 MG tablet    Oral   Take 1,500 mg by mouth every 6 (six) hours as needed for pain.         Marland Kitchen lamoTRIgine (LAMICTAL) 25 MG tablet   Oral   Take 25 mg by mouth daily.         . medroxyPROGESTERone (DEPO-PROVERA) 150 MG/ML injection   Intramuscular   Inject 150 mg into the muscle every 3 (three) months.           . metroNIDAZOLE (FLAGYL) 500 MG tablet   Oral   Take 1 tablet (500 mg total) by mouth 2 (two) times daily.   14 tablet   0   . Multiple Vitamin (MULTIVITAMIN WITH MINERALS) TABS   Oral   Take 1 tablet by mouth daily.         Marland Kitchen oxyCODONE-acetaminophen (PERCOCET/ROXICET) 5-325 MG per tablet   Oral   Take 1 tablet by mouth every 4 (four) hours as needed for pain.   15 tablet   0   . promethazine (PHENERGAN) 25 MG tablet   Oral   Take 1 tablet (25 mg total) by mouth every 6 (six) hours as needed for nausea.   12 tablet   0     BP 119/59  Pulse 72  Temp(Src) 98.5 F (36.9 C) (Oral)  Resp 16  Ht 5\' 3"  (1.6  m)  Wt 165 lb (74.844 kg)  BMI 29.24 kg/m2  SpO2 98%  Physical Exam  Nursing note and vitals reviewed. Constitutional: She is oriented to person, place, and time. She appears well-developed and well-nourished. No distress.  HENT:  Head: Normocephalic and atraumatic.  Neck: Normal range of motion. Neck supple.  Cardiovascular: Normal rate and regular rhythm.  Exam reveals no gallop and no friction rub.   No murmur heard. Pulmonary/Chest: Effort normal and breath sounds normal. No respiratory distress. She has no wheezes.  Abdominal: Soft. Bowel sounds are normal. She exhibits no distension. There is no tenderness.  Musculoskeletal: Normal range of motion.  Neurological: She is alert and oriented to person, place, and time.  Skin: Skin is warm and dry. She is not diaphoretic.    ED Course  Procedures (including critical care time)  Labs Reviewed  URINALYSIS, ROUTINE W REFLEX MICROSCOPIC  PREGNANCY, URINE   No results found.   No diagnosis  found.    MDM  The patient presents here with pain in the right flank that she believes is related to a kidney stone.  There is no uti, hematuria, and the scan does not reveal any obstructive uropathy.  She does have significant stool in the colon that I believe may be the cause of her symptoms.  As she has been taking percocet, I am concerned that this is causing her to be constipated and likely making the problem worse.  Will discharge to home with mag citrate, ibuprofen for pain.        Sudie Grumbling, MD 08/30/12 [phone]

## 2012-11-08 ENCOUNTER — Ambulatory Visit (INDEPENDENT_AMBULATORY_CARE_PROVIDER_SITE_OTHER): Payer: BC Managed Care – PPO | Admitting: Obstetrics & Gynecology

## 2012-11-08 ENCOUNTER — Encounter: Payer: Self-pay | Admitting: *Deleted

## 2012-11-08 ENCOUNTER — Encounter: Payer: Self-pay | Admitting: Obstetrics & Gynecology

## 2012-11-08 VITALS — BP 110/70 | Ht 64.0 in | Wt 167.0 lb

## 2012-11-08 DIAGNOSIS — N946 Dysmenorrhea, unspecified: Secondary | ICD-10-CM

## 2012-11-08 MED ORDER — NORGESTIM-ETH ESTRAD TRIPHASIC 0.18/0.215/0.25 MG-25 MCG PO TABS: 1.0000 | ORAL_TABLET | Freq: Every day | ORAL | Status: DC

## 2012-11-09 DIAGNOSIS — N946 Dysmenorrhea, unspecified: Secondary | ICD-10-CM | POA: Insufficient documentation

## 2012-11-09 NOTE — Progress Notes (Signed)
Patient ID: Marie Brown, female   DOB: 1990/08/16, 22 y.o.   MRN: [id number] Patient had her last depo shot in February This would have lasted through the month of April She has not had a period yet She has not been sexually active  The reason she went on the depo was because of very heavy and painful menses She wants to go back on pills but is concerned with her menstrual symptoms  She will start taking Sprintec tonight she has not been sexually active If she has difficulty with her menses we will make other arrangements

## 2013-01-19 ENCOUNTER — Other Ambulatory Visit (HOSPITAL_COMMUNITY): Payer: Self-pay | Admitting: Nurse Practitioner

## 2013-01-26 ENCOUNTER — Ambulatory Visit (HOSPITAL_COMMUNITY)
Admission: RE | Admit: 2013-01-26 | Discharge: 2013-01-26 | Disposition: A | Discharge status: Home / Self Care | Payer: BC Managed Care – PPO | Source: Ambulatory Visit | Attending: Family Medicine | Admitting: Family Medicine

## 2013-01-26 ENCOUNTER — Other Ambulatory Visit (HOSPITAL_COMMUNITY): Payer: Self-pay | Admitting: Nurse Practitioner

## 2013-01-26 DIAGNOSIS — E049 Nontoxic goiter, unspecified: Secondary | ICD-10-CM | POA: Insufficient documentation

## 2013-02-02 ENCOUNTER — Other Ambulatory Visit (HOSPITAL_COMMUNITY): Payer: Self-pay | Admitting: Nurse Practitioner

## 2013-02-02 ENCOUNTER — Encounter (HOSPITAL_COMMUNITY): Payer: Self-pay

## 2013-02-02 ENCOUNTER — Ambulatory Visit (HOSPITAL_COMMUNITY)
Admission: RE | Admit: 2013-02-02 | Discharge: 2013-02-02 | Disposition: A | Discharge status: Home / Self Care | Payer: BC Managed Care – PPO | Source: Ambulatory Visit | Attending: Family Medicine | Admitting: Family Medicine

## 2013-02-02 ENCOUNTER — Other Ambulatory Visit (HOSPITAL_COMMUNITY): Payer: Self-pay | Admitting: Family Medicine

## 2013-02-02 VITALS — BP 106/71 | HR 94 | Temp 98.0°F | Resp 16

## 2013-02-02 DIAGNOSIS — N63 Unspecified lump in unspecified breast: Secondary | ICD-10-CM | POA: Insufficient documentation

## 2013-02-02 DIAGNOSIS — N6039 Fibrosclerosis of unspecified breast: Secondary | ICD-10-CM | POA: Insufficient documentation

## 2013-02-02 DIAGNOSIS — N61 Mastitis without abscess: Secondary | ICD-10-CM | POA: Insufficient documentation

## 2013-02-02 MED ORDER — LIDOCAINE HCL (PF) 2 % IJ SOLN
INTRAMUSCULAR | Status: AC
  Administered 2013-02-02: 3 mL
  Filled 2013-02-02: qty 10

## 2013-02-02 MED ORDER — LIDOCAINE HCL (PF) 2 % IJ SOLN
10.0000 mL | Freq: Once | INTRAMUSCULAR | Status: AC
  Administered 2013-02-02: 3 mL
  Filled 2013-02-02: qty 10

## 2013-04-18 ENCOUNTER — Other Ambulatory Visit: Payer: BC Managed Care – PPO | Admitting: Obstetrics and Gynecology

## 2013-05-05 ENCOUNTER — Encounter: Payer: Self-pay | Admitting: Obstetrics & Gynecology

## 2013-05-05 ENCOUNTER — Ambulatory Visit (INDEPENDENT_AMBULATORY_CARE_PROVIDER_SITE_OTHER): Payer: Self-pay | Admitting: Obstetrics & Gynecology

## 2013-05-05 ENCOUNTER — Telehealth: Payer: Self-pay | Admitting: Obstetrics & Gynecology

## 2013-05-05 VITALS — BP 120/80 | Ht 63.2 in | Wt 171.0 lb

## 2013-05-05 DIAGNOSIS — R102 Pelvic and perineal pain: Secondary | ICD-10-CM

## 2013-05-05 DIAGNOSIS — N200 Calculus of kidney: Secondary | ICD-10-CM

## 2013-05-05 MED ORDER — OXYCODONE-ACETAMINOPHEN 5-325 MG PO TABS: 1.0000 | ORAL_TABLET | ORAL | Status: DC | PRN

## 2013-05-05 MED ORDER — CYCLOBENZAPRINE HCL 10 MG PO TABS: 10.0000 mg | ORAL_TABLET | Freq: Three times a day (TID) | ORAL | Status: DC | PRN

## 2013-05-05 MED ORDER — DOXYCYCLINE HYCLATE 50 MG PO CAPS: 100.0000 mg | ORAL_CAPSULE | Freq: Two times a day (BID) | ORAL | Status: DC

## 2013-05-05 NOTE — Addendum Note (Signed)
Addended by: Colen Darling on: 05/05/2013 03:08 PM   Modules accepted: Orders

## 2013-05-05 NOTE — Telephone Encounter (Signed)
Pt states Doxycycline $60, has no insurance, cannot afford is there an alternative?

## 2013-05-05 NOTE — Progress Notes (Signed)
Patient ID: Marie Brown, female   DOB: 1990/08/24, 22 y.o.   MRN: [id number] Pt with about  Week history of pelvic and right back/flank pain History of kidney stones, several attacks and says she still has several Pelvic +CMT Back + tenderness over right parspinous vs possible kidney stone  Will treat with doxycycline to cover PID but use flexeril + oxycodone  Past Medical History  Diagnosis Date  . Kidney stones   . Carpal tunnel syndrome   . Bipolar 2 disorder     Past Surgical History  Procedure Laterality Date  . Appendectomy    . Foot surgery Bilateral 2005    OB History   Grav Para Term Preterm Abortions TAB SAB Ect Mult Living                  Allergies  Allergen Reactions  . Rubbing Alcohol [Alcohol] Rash    History   Social History  . Marital Status: Single    Spouse Name: N/A    Number of Children: N/A  . Years of Education: N/A   Social History Main Topics  . Smoking status: Current Every Day Smoker -- 0.50 packs/day for 3 years    Types: Cigarettes  . Smokeless tobacco: Never Used  . Alcohol Use: No  . Drug Use: No  . Sexual Activity: Yes    Birth Control/ Protection: Injection   Other Topics Concern  . None   Social History Narrative  . None    Family History  Problem Relation Age of Onset  . Hypertension Mother   . Diabetes Other   . Thyroid disease Other   . Heart disease Other   . Birth defects Other   . Mental illness Other   . Stroke Other

## 2013-05-06 LAB — GC/CHLAMYDIA PROBE AMP
CT Probe RNA: NEGATIVE
GC Probe RNA: NEGATIVE

## 2014-04-07 ENCOUNTER — Encounter (HOSPITAL_COMMUNITY): Payer: Self-pay | Admitting: *Deleted

## 2014-04-07 ENCOUNTER — Emergency Department (HOSPITAL_COMMUNITY): Payer: Self-pay

## 2014-04-07 ENCOUNTER — Emergency Department (HOSPITAL_COMMUNITY)
Admission: EM | Admit: 2014-04-07 | Discharge: 2014-04-07 | Disposition: A | Discharge status: Home / Self Care | Payer: Self-pay | Attending: Emergency Medicine | Admitting: Emergency Medicine

## 2014-04-07 DIAGNOSIS — Z79899 Other long term (current) drug therapy: Secondary | ICD-10-CM | POA: Insufficient documentation

## 2014-04-07 DIAGNOSIS — Z8669 Personal history of other diseases of the nervous system and sense organs: Secondary | ICD-10-CM | POA: Insufficient documentation

## 2014-04-07 DIAGNOSIS — Z8659 Personal history of other mental and behavioral disorders: Secondary | ICD-10-CM | POA: Insufficient documentation

## 2014-04-07 DIAGNOSIS — R109 Unspecified abdominal pain: Secondary | ICD-10-CM

## 2014-04-07 DIAGNOSIS — Z792 Long term (current) use of antibiotics: Secondary | ICD-10-CM | POA: Insufficient documentation

## 2014-04-07 DIAGNOSIS — Z720 Tobacco use: Secondary | ICD-10-CM | POA: Insufficient documentation

## 2014-04-07 DIAGNOSIS — R1032 Left lower quadrant pain: Secondary | ICD-10-CM | POA: Insufficient documentation

## 2014-04-07 DIAGNOSIS — Z87448 Personal history of other diseases of urinary system: Secondary | ICD-10-CM | POA: Insufficient documentation

## 2014-04-07 LAB — URINALYSIS, ROUTINE W REFLEX MICROSCOPIC
Bilirubin Urine: NEGATIVE
Glucose, UA: NEGATIVE mg/dL
Hgb urine dipstick: NEGATIVE
Ketones, ur: NEGATIVE mg/dL
Leukocytes, UA: NEGATIVE
Nitrite: NEGATIVE
Protein, ur: NEGATIVE mg/dL
Specific Gravity, Urine: 1.025 (ref 1.005–1.030)
Urobilinogen, UA: 0.2 mg/dL (ref 0.0–1.0)
pH: 6 (ref 5.0–8.0)

## 2014-04-07 MED ORDER — HYDROCODONE-ACETAMINOPHEN 5-325 MG PO TABS: 2.0000 | ORAL_TABLET | ORAL | Status: DC | PRN

## 2014-04-07 MED ORDER — PROMETHAZINE HCL 25 MG PO TABS: 25.0000 mg | ORAL_TABLET | Freq: Four times a day (QID) | ORAL | Status: DC | PRN

## 2014-04-07 MED ORDER — KETOROLAC TROMETHAMINE 30 MG/ML IJ SOLN
60.0000 mg | Freq: Once | INTRAMUSCULAR | Status: AC
  Administered 2014-04-07: 60 mg via INTRAMUSCULAR
  Filled 2014-04-07: qty 2

## 2014-04-07 NOTE — ED Provider Notes (Signed)
CSN: [id number]     Arrival date & time 04/07/14  1548 History  This chart was scribe for Nat Christen, MD by Judithann Sauger, ED Scribe. The patient was seen in room [facility]08/[facility]08 and the patient's care was started at 4:20 PM.    Chief Complaint  Patient presents with  . Abdominal Pain   The history is provided by the patient. No language interpreter was used.   HPI Comments: Marie Brown is a 23 y.o. female with hx of kidney stones who presents to the Emergency Department complaining of LLQ pain and bilateral lower back pain onset last night around 11 pm. She reports associated frequent urination and chills. She denies any fever. She states that her last menstrual cycle was on 03/14/14. She reports that she has had 2 episodes of passing kidney stones. She passed 2 stones in the first episode and 3 stones in the second episode.  She is not sexually active with men. No vaginal bleeding or discharge. Severity is mild.  Past Medical History  Diagnosis Date  . Carpal tunnel syndrome   . Bipolar 2 disorder   . Kidney stones    Past Surgical History  Procedure Laterality Date  . Appendectomy    . Foot surgery Bilateral 2005   Family History  Problem Relation Age of Onset  . Hypertension Mother   . Diabetes Other   . Thyroid disease Other   . Heart disease Other   . Birth defects Other   . Mental illness Other   . Stroke Other    History  Substance Use Topics  . Smoking status: Current Every Day Smoker -- 0.50 packs/day for 3 years    Types: Cigarettes  . Smokeless tobacco: Never Used  . Alcohol Use: No   OB History    No data available     Review of Systems  Constitutional: Negative for fever.  Gastrointestinal: Positive for abdominal pain. Negative for nausea and vomiting.  Genitourinary: Positive for flank pain.      Allergies  Rubbing alcohol  Home Medications   Prior to Admission medications   Medication Sig Start Date End Date Taking? Authorizing Provider   acetaminophen (TYLENOL) 500 MG tablet Take 1,500 mg by mouth every 6 (six) hours as needed for pain.   Yes Historical Provider, MD  Cranberry-Vitamin C-Probiotic (AZO CRANBERRY) 250-30 MG TABS Take 1 tablet by mouth daily as needed (for pain with urination).   Yes Historical Provider, MD  Multiple Vitamin (MULTIVITAMIN WITH MINERALS) TABS Take 1 tablet by mouth daily.   Yes Historical Provider, MD  cyclobenzaprine (FLEXERIL) 10 MG tablet Take 1 tablet (10 mg total) by mouth 3 (three) times daily as needed for muscle spasms. Patient not taking: Reported on 04/07/2014 05/05/13   Florian Buff, MD  doxycycline (VIBRAMYCIN) 50 MG capsule Take 2 capsules (100 mg total) by mouth 2 (two) times daily. Patient not taking: Reported on 04/07/2014 05/05/13   Florian Buff, MD  HYDROcodone-acetaminophen (NORCO) 5-325 MG per tablet Take 2 tablets by mouth every 4 (four) hours as needed. 04/07/14   Nat Christen, MD  metroNIDAZOLE (FLAGYL) 500 MG tablet Take 1 tablet (500 mg total) by mouth 2 (two) times daily. Patient not taking: Reported on 04/07/2014 08/17/12   Evalee Jefferson, PA-C  Norgestimate-Ethinyl Estradiol Triphasic 0.18/0.215/0.25 MG-25 MCG tab Take 1 tablet by mouth daily. Patient not taking: Reported on 04/07/2014 11/08/12   Florian Buff, MD  oxyCODONE-acetaminophen (PERCOCET/ROXICET) 5-325 MG per tablet Take 1 tablet  by mouth every 4 (four) hours as needed for pain. Patient not taking: Reported on 04/07/2014 08/17/12   Evalee Jefferson, PA-C  oxyCODONE-acetaminophen (ROXICET) 5-325 MG per tablet Take 1-2 tablets by mouth every 4 (four) hours as needed for severe pain. Patient not taking: Reported on 04/07/2014 05/05/13   Florian Buff, MD  promethazine (PHENERGAN) 25 MG tablet Take 1 tablet (25 mg total) by mouth every 6 (six) hours as needed. 04/07/14   Nat Christen, MD   BP 110/68 mmHg  Pulse 72  Temp(Src) 98.9 F (37.2 C) (Oral)  Resp 20  Ht 5\' 3"  (1.6 m)  Wt 165 lb 6.4 oz (75.025 kg)  BMI 29.31 kg/m2   SpO2 100%  LMP 03/14/2014 Physical Exam  Constitutional: She is oriented to person, place, and time. She appears well-developed and well-nourished.  HENT:  Head: Normocephalic and atraumatic.  Eyes: Conjunctivae and EOM are normal. Pupils are equal, round, and reactive to light.  Neck: Normal range of motion. Neck supple.  Cardiovascular: Normal rate, regular rhythm and normal heart sounds.   Pulmonary/Chest: Effort normal and breath sounds normal.  Abdominal: Soft. Bowel sounds are normal.  Musculoskeletal: Normal range of motion.  Minimal LLQ tenderness, Minimal bilateral flank pain   Neurological: She is alert and oriented to person, place, and time.  Skin: Skin is warm and dry.  Psychiatric: She has a normal mood and affect. Her behavior is normal.  Nursing note and vitals reviewed.   ED Course  Procedures (including critical care time) DIAGNOSTIC STUDIES: Oxygen Saturation is 100% on RA, normal by my interpretation.    COORDINATION OF CARE: 4:22 PM- Pt advised of plan for treatment and pt agrees.    Labs Review Labs Reviewed  URINALYSIS, ROUTINE W REFLEX MICROSCOPIC    Imaging Review Dg Abd 1 View  04/07/2014   CLINICAL DATA:  Initial encounter for right flank pain radiates into the lower abdomen since yesterday. Associated nausea and vomiting. Prior appendectomy.  EXAM: ABDOMEN - 1 VIEW  COMPARISON:  06/22/2011.  FINDINGS: Bowel gas pattern is nonspecific. No unexpected abdominal pelvic calcification. Apparent suture material in the right lower quadrant is compatible with the reported history of prior appendectomy. The visualized bony structures are normal.  IMPRESSION: No radiographic findings to explain the patient's history of pain.   Electronically Signed   By: Misty Stanley M.D.   On: 04/07/2014 17:08     EKG Interpretation None      MDM   Final diagnoses:  Left flank pain   Patient appears in no acute distress. Urinalysis normal. KUB negative. No acute  abdomen. Discharge medications Norco and Phenergan 25 mg  I personally performed the services described in this documentation, which was scribed in my presence. The recorded information has been reviewed and is accurate.    Nat Christen, MD 04/07/14 1739

## 2014-04-07 NOTE — Discharge Instructions (Signed)
Urinalysis and x-ray were normal. Medication for pain and nausea. Return if worse.

## 2014-04-07 NOTE — ED Notes (Signed)
Pain rt flank and into lower abd since yesterday.  N/v  No diarrhea.

## 2014-05-10 ENCOUNTER — Encounter (HOSPITAL_COMMUNITY): Payer: Self-pay | Admitting: *Deleted

## 2014-05-10 ENCOUNTER — Emergency Department (HOSPITAL_COMMUNITY)
Admission: EM | Admit: 2014-05-10 | Discharge: 2014-05-10 | Disposition: A | Discharge status: Home / Self Care | Payer: Self-pay | Attending: Emergency Medicine | Admitting: Emergency Medicine

## 2014-05-10 DIAGNOSIS — Z79899 Other long term (current) drug therapy: Secondary | ICD-10-CM | POA: Insufficient documentation

## 2014-05-10 DIAGNOSIS — Z9089 Acquired absence of other organs: Secondary | ICD-10-CM | POA: Insufficient documentation

## 2014-05-10 DIAGNOSIS — Z8669 Personal history of other diseases of the nervous system and sense organs: Secondary | ICD-10-CM | POA: Insufficient documentation

## 2014-05-10 DIAGNOSIS — Z3202 Encounter for pregnancy test, result negative: Secondary | ICD-10-CM | POA: Insufficient documentation

## 2014-05-10 DIAGNOSIS — R51 Headache: Secondary | ICD-10-CM | POA: Insufficient documentation

## 2014-05-10 DIAGNOSIS — Z87891 Personal history of nicotine dependence: Secondary | ICD-10-CM | POA: Insufficient documentation

## 2014-05-10 DIAGNOSIS — Z792 Long term (current) use of antibiotics: Secondary | ICD-10-CM | POA: Insufficient documentation

## 2014-05-10 DIAGNOSIS — M791 Myalgia: Secondary | ICD-10-CM | POA: Insufficient documentation

## 2014-05-10 DIAGNOSIS — Z87442 Personal history of urinary calculi: Secondary | ICD-10-CM | POA: Insufficient documentation

## 2014-05-10 DIAGNOSIS — Z8659 Personal history of other mental and behavioral disorders: Secondary | ICD-10-CM | POA: Insufficient documentation

## 2014-05-10 DIAGNOSIS — K529 Noninfective gastroenteritis and colitis, unspecified: Secondary | ICD-10-CM | POA: Insufficient documentation

## 2014-05-10 LAB — CBC WITH DIFFERENTIAL/PLATELET
Basophils Absolute: 0 10*3/uL (ref 0.0–0.1)
Basophils Relative: 0 % (ref 0–1)
Eosinophils Absolute: 0 10*3/uL (ref 0.0–0.7)
Eosinophils Relative: 0 % (ref 0–5)
HCT: 42.1 % (ref 36.0–46.0)
Hemoglobin: 13.8 g/dL (ref 12.0–15.0)
Lymphocytes Relative: 7 % — ABNORMAL LOW (ref 12–46)
Lymphs Abs: 1.2 10*3/uL (ref 0.7–4.0)
MCH: 31.5 pg (ref 26.0–34.0)
MCHC: 32.8 g/dL (ref 30.0–36.0)
MCV: 96.1 fL (ref 78.0–100.0)
Monocytes Absolute: 0.3 10*3/uL (ref 0.1–1.0)
Monocytes Relative: 2 % — ABNORMAL LOW (ref 3–12)
Neutro Abs: 14.6 10*3/uL — ABNORMAL HIGH (ref 1.7–7.7)
Neutrophils Relative %: 91 % — ABNORMAL HIGH (ref 43–77)
Platelets: 290 10*3/uL (ref 150–400)
RBC: 4.38 MIL/uL (ref 3.87–5.11)
RDW: 12.9 % (ref 11.5–15.5)
WBC: 16.1 10*3/uL — ABNORMAL HIGH (ref 4.0–10.5)

## 2014-05-10 LAB — COMPREHENSIVE METABOLIC PANEL
ALT: 16 U/L (ref 0–35)
AST: 21 U/L (ref 0–37)
Albumin: 4.6 g/dL (ref 3.5–5.2)
Alkaline Phosphatase: 56 U/L (ref 39–117)
Anion gap: 13 (ref 5–15)
BUN: 16 mg/dL (ref 6–23)
CO2: 22 mmol/L (ref 19–32)
Calcium: 9.3 mg/dL (ref 8.4–10.5)
Chloride: 103 mEq/L (ref 96–112)
Creatinine, Ser: 0.85 mg/dL (ref 0.50–1.10)
GFR calc Af Amer: >90 mL/min (ref >90)
GFR calc non Af Amer: >90 mL/min (ref >90)
Glucose, Bld: 105 mg/dL — ABNORMAL HIGH (ref 70–99)
Potassium: 3.5 mmol/L (ref 3.5–5.1)
Sodium: 138 mmol/L (ref 135–145)
Total Bilirubin: 1 mg/dL (ref 0.3–1.2)
Total Protein: 8.2 g/dL (ref 6.0–8.3)

## 2014-05-10 LAB — URINALYSIS, ROUTINE W REFLEX MICROSCOPIC
Bilirubin Urine: NEGATIVE
Glucose, UA: NEGATIVE mg/dL
Hgb urine dipstick: NEGATIVE
Ketones, ur: >80 mg/dL — AB
Leukocytes, UA: NEGATIVE
Nitrite: NEGATIVE
Protein, ur: NEGATIVE mg/dL
Specific Gravity, Urine: 1.01 (ref 1.005–1.030)
Urobilinogen, UA: 0.2 mg/dL (ref 0.0–1.0)
pH: 6 (ref 5.0–8.0)

## 2014-05-10 LAB — PREGNANCY, URINE: Preg Test, Ur: NEGATIVE

## 2014-05-10 MED ORDER — ONDANSETRON HCL 4 MG/2ML IJ SOLN
4.0000 mg | Freq: Once | INTRAMUSCULAR | Status: AC
  Administered 2014-05-10: 4 mg via INTRAVENOUS
  Filled 2014-05-10: qty 2

## 2014-05-10 MED ORDER — METOCLOPRAMIDE HCL 10 MG PO TABS: 10.0000 mg | ORAL_TABLET | Freq: Four times a day (QID) | ORAL | Status: DC | PRN

## 2014-05-10 MED ORDER — POTASSIUM CHLORIDE CRYS ER 20 MEQ PO TBCR
40.0000 meq | EXTENDED_RELEASE_TABLET | Freq: Once | ORAL | Status: AC
  Administered 2014-05-10: 40 meq via ORAL
  Filled 2014-05-10: qty 2

## 2014-05-10 MED ORDER — SODIUM CHLORIDE 0.9 % IV BOLUS (SEPSIS)
2000.0000 mL | Freq: Once | INTRAVENOUS | Status: AC
  Administered 2014-05-10: 2000 mL via INTRAVENOUS

## 2014-05-10 MED ORDER — SODIUM CHLORIDE 0.9 % IV SOLN
Freq: Once | INTRAVENOUS | Status: AC
  Administered 2014-05-10: 12:00:00 via INTRAVENOUS

## 2014-05-10 MED ORDER — MORPHINE SULFATE 4 MG/ML IJ SOLN
4.0000 mg | Freq: Once | INTRAMUSCULAR | Status: AC
  Administered 2014-05-10: 4 mg via INTRAVENOUS
  Filled 2014-05-10: qty 1

## 2014-05-10 MED ORDER — METOCLOPRAMIDE HCL 5 MG/ML IJ SOLN
10.0000 mg | Freq: Once | INTRAMUSCULAR | Status: AC
  Administered 2014-05-10: 10 mg via INTRAVENOUS
  Filled 2014-05-10: qty 2

## 2014-05-10 NOTE — Discharge Instructions (Signed)
Subjective:   Take the medication prescribed as needed for nausea, it should also work well for migraine headaches. You can also take Tylenol as directed for pain .Take Imodium as directed for diarrhea. Drink six 8 ounce glasses of water or Gatorade daily to prevent dehydration. See your doctor at the health Department or return if not feeling better in 2 or 3 days.

## 2014-05-10 NOTE — ED Notes (Signed)
Pt with abd pain and vomited x 10 with diarrhea per pt since 0200 this morning

## 2014-05-10 NOTE — ED Provider Notes (Signed)
CSN: [id number]     Arrival date & time 05/10/14  1110 History   First MD Initiated Contact with Patient 05/10/14 1307     Chief Complaint  Patient presents with  . Abdominal Pain     (Consider location/radiation/quality/duration/timing/severity/associated sxs/prior Treatment) HPI  Complains of headache, lower abdominal pain abdominal pain chest pain, cough and sore throat onset 2 AM today symptoms accompanied by general malaise, diffuse myalgias. No urinary symptoms no vaginal discharge. Last normal menstrual period 04/27/2014. She reports vomiting probably 10 times and diarrhea approximately 10 times she feels improved since treatment with Zofran here. Nothing makes symptoms better or worse. No treatment prior to coming Past Medical History  Diagnosis Date  . Carpal tunnel syndrome   . Bipolar 2 disorder   . Kidney stones    Past Surgical History  Procedure Laterality Date  . Appendectomy    . Foot surgery Bilateral 2005   Family History  Problem Relation Age of Onset  . Hypertension Mother   . Diabetes Other   . Thyroid disease Other   . Heart disease Other   . Birth defects Other   . Mental illness Other   . Stroke Other    History  Substance Use Topics  . Smoking status: Former Smoker -- 0.50 packs/day for 3 years    Types: Cigarettes    Quit date: 03/10/2014  . Smokeless tobacco: Never Used  . Alcohol Use: Yes     Comment: occ.   OB History    No data available     Review of Systems  Respiratory: Positive for cough.   Gastrointestinal: Positive for vomiting, abdominal pain and diarrhea.  Musculoskeletal: Positive for myalgias.       Diffuse myalgias  Neurological: Positive for headaches.  All other systems reviewed and are negative.      Allergies  Rubbing alcohol  Home Medications   Prior to Admission medications   Medication Sig Start Date End Date Taking? Authorizing Provider  acetaminophen (TYLENOL) 500 MG tablet Take 1,500 mg by mouth every  6 (six) hours as needed for pain.   Yes Historical Provider, MD  Cranberry-Vitamin C-Probiotic (AZO CRANBERRY) 250-30 MG TABS Take 1 tablet by mouth daily as needed (for pain with urination).   Yes Historical Provider, MD  Multiple Vitamin (MULTIVITAMIN WITH MINERALS) TABS Take 1 tablet by mouth daily.   Yes Historical Provider, MD  cyclobenzaprine (FLEXERIL) 10 MG tablet Take 1 tablet (10 mg total) by mouth 3 (three) times daily as needed for muscle spasms. Patient not taking: Reported on 04/07/2014 05/05/13   Florian Buff, MD  doxycycline (VIBRAMYCIN) 50 MG capsule Take 2 capsules (100 mg total) by mouth 2 (two) times daily. Patient not taking: Reported on 04/07/2014 05/05/13   Florian Buff, MD  HYDROcodone-acetaminophen (NORCO) 5-325 MG per tablet Take 2 tablets by mouth every 4 (four) hours as needed. Patient not taking: Reported on 05/10/2014 04/07/14   Nat Christen, MD  metroNIDAZOLE (FLAGYL) 500 MG tablet Take 1 tablet (500 mg total) by mouth 2 (two) times daily. Patient not taking: Reported on 04/07/2014 08/17/12   Evalee Jefferson, PA-C  Norgestimate-Ethinyl Estradiol Triphasic 0.18/0.215/0.25 MG-25 MCG tab Take 1 tablet by mouth daily. Patient not taking: Reported on 04/07/2014 11/08/12   Florian Buff, MD  oxyCODONE-acetaminophen (PERCOCET/ROXICET) 5-325 MG per tablet Take 1 tablet by mouth every 4 (four) hours as needed for pain. Patient not taking: Reported on 04/07/2014 08/17/12   Evalee Jefferson, PA-C  oxyCODONE-acetaminophen (ROXICET)  5-325 MG per tablet Take 1-2 tablets by mouth every 4 (four) hours as needed for severe pain. Patient not taking: Reported on 04/07/2014 05/05/13   Florian Buff, MD  promethazine (PHENERGAN) 25 MG tablet Take 1 tablet (25 mg total) by mouth every 6 (six) hours as needed. Patient not taking: Reported on 05/10/2014 04/07/14   Nat Christen, MD   BP 124/70 mmHg  Pulse 99  Temp(Src) 98 F (36.7 C) (Oral)  Resp 16  Ht 5\' 3"  (1.6 m)  Wt 165 lb (74.844 kg)  BMI 29.24  kg/m2  SpO2 100%  LMP 04/15/2014 Physical Exam  Constitutional: She appears well-developed and well-nourished.  HENT:  Head: Normocephalic and atraumatic.  Eyes: Conjunctivae are normal. Pupils are equal, round, and reactive to light.  Neck: Neck supple. No tracheal deviation present. No thyromegaly present.  Cardiovascular: Normal rate and regular rhythm.   No murmur heard. Pulmonary/Chest: Effort normal and breath sounds normal.  Abdominal: Soft. Bowel sounds are normal. She exhibits no distension. There is no tenderness.  Musculoskeletal: Normal range of motion. She exhibits no edema or tenderness.  Neurological: She is alert. Coordination normal.  Skin: Skin is warm and dry. No rash noted.  Psychiatric: She has a normal mood and affect.  Nursing note and vitals reviewed.   ED Course  Procedures (including critical care time) Labs Review Labs Reviewed  CBC WITH DIFFERENTIAL - Abnormal; Notable for the following:    WBC 16.1 (*)    Neutrophils Relative % 91 (*)    Neutro Abs 14.6 (*)    Lymphocytes Relative 7 (*)    Monocytes Relative 2 (*)    All other components within normal limits  COMPREHENSIVE METABOLIC PANEL - Abnormal; Notable for the following:    Glucose, Bld 105 (*)    All other components within normal limits    Imaging Review No results found.   EKG Interpretation None      145 5 PM abdominal pain and nausea resolved after treatment with intravenous fluids, morphine and Zofran. She continues to complain of severe headache typical of the migraines she gets approximately twice per week. Intravenous Reglan ordered   At 1530 5 PM patient's pain is much improved she feels ready to go home  Results for orders placed or performed during the hospital encounter of 05/10/14  CBC with Differential  Result Value Ref Range   WBC 16.1 (H) 4.0 - 10.5 K/uL   RBC 4.38 3.87 - 5.11 MIL/uL   Hemoglobin 13.8 12.0 - 15.0 g/dL   HCT 42.1 36.0 - 46.0 %   MCV 96.1 78.0 -  100.0 fL   MCH 31.5 26.0 - 34.0 pg   MCHC 32.8 30.0 - 36.0 g/dL   RDW 12.9 11.5 - 15.5 %   Platelets 290 150 - 400 K/uL   Neutrophils Relative % 91 (H) 43 - 77 %   Neutro Abs 14.6 (H) 1.7 - 7.7 K/uL   Lymphocytes Relative 7 (L) 12 - 46 %   Lymphs Abs 1.2 0.7 - 4.0 K/uL   Monocytes Relative 2 (L) 3 - 12 %   Monocytes Absolute 0.3 0.1 - 1.0 K/uL   Eosinophils Relative 0 0 - 5 %   Eosinophils Absolute 0.0 0.0 - 0.7 K/uL   Basophils Relative 0 0 - 1 %   Basophils Absolute 0.0 0.0 - 0.1 K/uL  Comprehensive metabolic panel  Result Value Ref Range   Sodium 138 135 - 145 mmol/L   Potassium 3.5 3.5 -  5.1 mmol/L   Chloride 103 96 - 112 mEq/L   CO2 22 19 - 32 mmol/L   Glucose, Bld 105 (H) 70 - 99 mg/dL   BUN 16 6 - 23 mg/dL   Creatinine, Ser 0.85 0.50 - 1.10 mg/dL   Calcium 9.3 8.4 - 10.5 mg/dL   Total Protein 8.2 6.0 - 8.3 g/dL   Albumin 4.6 3.5 - 5.2 g/dL   AST 21 0 - 37 U/L   ALT 16 0 - 35 U/L   Alkaline Phosphatase 56 39 - 117 U/L   Total Bilirubin 1.0 0.3 - 1.2 mg/dL   GFR calc non Af Amer >90 >90 mL/min   GFR calc Af Amer >90 >90 mL/min   Anion gap 13 5 - 15  Urinalysis, Routine w reflex microscopic  Result Value Ref Range   Color, Urine YELLOW YELLOW   APPearance CLEAR CLEAR   Specific Gravity, Urine 1.010 1.005 - 1.030   pH 6.0 5.0 - 8.0   Glucose, UA NEGATIVE NEGATIVE mg/dL   Hgb urine dipstick NEGATIVE NEGATIVE   Bilirubin Urine NEGATIVE NEGATIVE   Ketones, ur >80 (A) NEGATIVE mg/dL   Protein, ur NEGATIVE NEGATIVE mg/dL   Urobilinogen, UA 0.2 0.0 - 1.0 mg/dL   Nitrite NEGATIVE NEGATIVE   Leukocytes, UA NEGATIVE NEGATIVE  Pregnancy, urine  Result Value Ref Range   Preg Test, Ur NEGATIVE NEGATIVE   No results found.  MDM   Final diagnoses:  None  Plan encourage oral hydration. Prescription Reglan. Imodium as directed for diarrhea.F/u health Dep t if still symptomatic 2 -3 day Dx #1 gastroenteritis #2 dehydration #3 hypokalemia #4 migraine  headache      Orlie Dakin, MD 05/10/14 1547

## 2014-08-13 ENCOUNTER — Emergency Department (HOSPITAL_COMMUNITY): Payer: 59

## 2014-08-13 ENCOUNTER — Emergency Department (HOSPITAL_COMMUNITY)
Admission: EM | Admit: 2014-08-13 | Discharge: 2014-08-13 | Disposition: A | Discharge status: Home / Self Care | Payer: 59 | Attending: Emergency Medicine | Admitting: Emergency Medicine

## 2014-08-13 ENCOUNTER — Encounter (HOSPITAL_COMMUNITY): Payer: Self-pay | Admitting: *Deleted

## 2014-08-13 DIAGNOSIS — S3992XA Unspecified injury of lower back, initial encounter: Secondary | ICD-10-CM | POA: Insufficient documentation

## 2014-08-13 DIAGNOSIS — Z87442 Personal history of urinary calculi: Secondary | ICD-10-CM | POA: Insufficient documentation

## 2014-08-13 DIAGNOSIS — Z720 Tobacco use: Secondary | ICD-10-CM | POA: Diagnosis not present

## 2014-08-13 DIAGNOSIS — Y998 Other external cause status: Secondary | ICD-10-CM | POA: Diagnosis not present

## 2014-08-13 DIAGNOSIS — Z8659 Personal history of other mental and behavioral disorders: Secondary | ICD-10-CM | POA: Insufficient documentation

## 2014-08-13 DIAGNOSIS — Y9389 Activity, other specified: Secondary | ICD-10-CM | POA: Insufficient documentation

## 2014-08-13 DIAGNOSIS — S199XXA Unspecified injury of neck, initial encounter: Secondary | ICD-10-CM | POA: Insufficient documentation

## 2014-08-13 DIAGNOSIS — Z792 Long term (current) use of antibiotics: Secondary | ICD-10-CM | POA: Insufficient documentation

## 2014-08-13 DIAGNOSIS — Z8669 Personal history of other diseases of the nervous system and sense organs: Secondary | ICD-10-CM | POA: Insufficient documentation

## 2014-08-13 DIAGNOSIS — Z79899 Other long term (current) drug therapy: Secondary | ICD-10-CM | POA: Diagnosis not present

## 2014-08-13 DIAGNOSIS — Y92410 Unspecified street and highway as the place of occurrence of the external cause: Secondary | ICD-10-CM | POA: Diagnosis not present

## 2014-08-13 MED ORDER — IBUPROFEN 800 MG PO TABS: 800.0000 mg | ORAL_TABLET | Freq: Three times a day (TID) | ORAL | Status: DC | PRN

## 2014-08-13 MED ORDER — HYDROCODONE-ACETAMINOPHEN 5-325 MG PO TABS: 1.0000 | ORAL_TABLET | ORAL | Status: DC | PRN

## 2014-08-13 MED ORDER — HYDROCODONE-ACETAMINOPHEN 5-325 MG PO TABS
2.0000 | ORAL_TABLET | Freq: Once | ORAL | Status: AC
  Administered 2014-08-13: 2 via ORAL
  Filled 2014-08-13: qty 2

## 2014-08-13 MED ORDER — IBUPROFEN 800 MG PO TABS
800.0000 mg | ORAL_TABLET | Freq: Once | ORAL | Status: AC
  Administered 2014-08-13: 800 mg via ORAL
  Filled 2014-08-13: qty 1

## 2014-08-13 NOTE — Discharge Instructions (Signed)

## 2014-08-13 NOTE — ED Notes (Signed)
Pt verbalized understanding of no driving and to use caution within 4 hours of taking pain meds due to meds cause drowsiness 
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
## 2014-08-15 ENCOUNTER — Emergency Department (HOSPITAL_COMMUNITY)
Admission: EM | Admit: 2014-08-15 | Discharge: 2014-08-15 | Disposition: A | Discharge status: Home / Self Care | Payer: 59 | Attending: Emergency Medicine | Admitting: Emergency Medicine

## 2014-08-15 ENCOUNTER — Emergency Department (HOSPITAL_COMMUNITY): Payer: 59

## 2014-08-15 ENCOUNTER — Encounter (HOSPITAL_COMMUNITY): Payer: Self-pay | Admitting: Emergency Medicine

## 2014-08-15 DIAGNOSIS — S4992XA Unspecified injury of left shoulder and upper arm, initial encounter: Secondary | ICD-10-CM | POA: Insufficient documentation

## 2014-08-15 DIAGNOSIS — S3992XA Unspecified injury of lower back, initial encounter: Secondary | ICD-10-CM | POA: Diagnosis present

## 2014-08-15 DIAGNOSIS — Z8659 Personal history of other mental and behavioral disorders: Secondary | ICD-10-CM | POA: Diagnosis not present

## 2014-08-15 DIAGNOSIS — M7918 Myalgia, other site: Secondary | ICD-10-CM

## 2014-08-15 DIAGNOSIS — S299XXA Unspecified injury of thorax, initial encounter: Secondary | ICD-10-CM | POA: Insufficient documentation

## 2014-08-15 DIAGNOSIS — Y92410 Unspecified street and highway as the place of occurrence of the external cause: Secondary | ICD-10-CM | POA: Insufficient documentation

## 2014-08-15 DIAGNOSIS — Y998 Other external cause status: Secondary | ICD-10-CM | POA: Insufficient documentation

## 2014-08-15 DIAGNOSIS — Z8669 Personal history of other diseases of the nervous system and sense organs: Secondary | ICD-10-CM | POA: Insufficient documentation

## 2014-08-15 DIAGNOSIS — Z720 Tobacco use: Secondary | ICD-10-CM | POA: Insufficient documentation

## 2014-08-15 DIAGNOSIS — S300XXA Contusion of lower back and pelvis, initial encounter: Secondary | ICD-10-CM | POA: Diagnosis not present

## 2014-08-15 DIAGNOSIS — Z87442 Personal history of urinary calculi: Secondary | ICD-10-CM | POA: Insufficient documentation

## 2014-08-15 DIAGNOSIS — Y9389 Activity, other specified: Secondary | ICD-10-CM | POA: Diagnosis not present

## 2014-08-15 DIAGNOSIS — S4991XA Unspecified injury of right shoulder and upper arm, initial encounter: Secondary | ICD-10-CM | POA: Insufficient documentation

## 2014-08-15 MED ORDER — METHOCARBAMOL 500 MG PO TABS: 1000.0000 mg | ORAL_TABLET | Freq: Four times a day (QID) | ORAL | Status: DC | PRN

## 2014-08-15 MED ORDER — KETOROLAC TROMETHAMINE 60 MG/2ML IM SOLN
60.0000 mg | Freq: Once | INTRAMUSCULAR | Status: AC
  Administered 2014-08-15: 60 mg via INTRAMUSCULAR
  Filled 2014-08-15: qty 2

## 2014-08-15 MED ORDER — ONDANSETRON HCL 4 MG PO TABS: 4.0000 mg | ORAL_TABLET | Freq: Three times a day (TID) | ORAL | Status: DC | PRN

## 2014-08-15 MED ORDER — MORPHINE SULFATE 4 MG/ML IJ SOLN
4.0000 mg | Freq: Once | INTRAMUSCULAR | Status: AC
  Administered 2014-08-15: 4 mg via INTRAMUSCULAR
  Filled 2014-08-15: qty 1
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
## 2014-08-15 NOTE — Discharge Instructions (Signed)
°Emergency Department Resource Guide °1) Find a Doctor and Pay Out of Pocket °Although you won't have to find out who is covered by your insurance plan, it is a good idea to ask around and get recommendations. You will then need to call the office and see if the doctor you have chosen will accept you as a new patient and what types of options they offer for patients who are self-pay. Some doctors offer discounts or will set up payment plans for their patients who do not have insurance, but you will need to ask so you aren't surprised when you get to your appointment. ° °2) Contact Your Local Health Department °Not all health departments have doctors that can see patients for sick visits, but many do, so it is worth a call to see if yours does. If you don't know where your local health department is, you can check in your phone book. The CDC also has a tool to help you locate your state's health department, and many state websites also have listings of all of their local health departments. ° °3) Find a Walk-in Clinic °If your illness is not likely to be very severe or complicated, you may want to try a walk in clinic. These are popping up all over the country in pharmacies, drugstores, and shopping centers. They're usually staffed by nurse practitioners or physician assistants that have been trained to treat common illnesses and complaints. They're usually fairly quick and inexpensive. However, if you have serious medical issues or chronic medical problems, these are probably not your best option. ° °No Primary Care Doctor: °- Call [facility] at  832-8000 - they can help you locate a primary care doctor that  accepts your insurance, provides certain services, etc. °- Physician Referral Service- 1-800-533-3463 ° °Chronic Pain Problems: °Organization         Address  Phone   Notes  ° [facility]  (336) 297-2271 Patients need to be referred by their primary care doctor.  ° °Medication  Assistance: °Organization         Address  Phone   Notes  °[place] [facility] [address] °[place], [place] [address] (336) 641-8030 --Must be a resident of [place] °-- Must have NO insurance coverage whatsoever (no Medicaid/ Medicare, etc.) °-- The pt. MUST have a primary care doctor that directs their care regularly and follows them in the community °  °[facility]  (866) 331-1348   °[employer]  (888) 892-1162   ° °Agencies that provide inexpensive medical care: °Organization         Address  Phone   Notes  °[facility]  (336) 832-8035   °[facility]    (336) 832-7272   °[facility] [address] °[place], [place] [address] (336) 832-4777   °[facility] [address], °[place] (336) 271-4999   °[facility]    (336) 373-0678   °[facility]    (336) 272-1050   °[facility] ° [address], [place] Phone:  (336) 832-4444, Fax:  (336) 832-4440 Hours of Operation:  9 am - 6 pm, M-F.  Also accepts Medicaid/Medicare and self-pay.  ° [facility] ° [address],  Phone: (336) 832-3150, Fax: (336) [phone]. Hours of Operation:  8:30 am - 5:30 pm, M-F.  Also accepts Medicaid and self-pay.  °[facility] [place] [address], [place] Phone: (336) 878-6027   °[facility] [address], [place], [place] (336)723-1848, Ext. 123 Mondays & Thursdays: 7-9 AM.  First 15 patients are seen on a first come, first serve basis. °  ° °Medicaid-accepting [place] Providers: ° °Organization         Address  Phone   Notes  °[facility] [address], [place] (336) 641-2100 Also accepts self-pay patients.  °[facility] [address], [place] ° (336) 856-9996   °[facility] [address], [place]  (336) 288-8857   °[facility] [address] [place] [address], [place] (336) 299-7000   °Veita Bland [address], [place]  ° (336) 373-1557 Only accepts [place] Access Medicaid patients after they have their name applied to their card.  ° °Self-Pay (no insurance) in [place]: ° °Organization         Address  Phone   Notes  °Sickle Cell Patients, [facility] [address], [place] (336) 832-1970   °[facility] [address], [place] (336) 832-4400   °[facility] ° [address], [place] (336) 992-4800   °[facility]/Dr. Osei-Bonsu ° [address] [place] [address], [place] or [address], [place] (336) [phone] Phone number for both [place] and [place] locations is the same.  °[facility] [address], [place] (336) 299-0000   °[facility] [address] [place] [address], [place] or [address] (336) 852-7530 °(336) 878-2260   °[facility] [address], [place] (336) 350-1642, phone; (336) 294-5005, fax Sees patients 1st and 3rd Saturday of every month.  Must not qualify for public or private insurance (i.e. Medicaid, Medicare, [place] Health Choice, Veterans' Benefits) • Household income should be no more than 200% of the poverty level •The clinic cannot treat you if you are pregnant or think you are pregnant • Sexually transmitted diseases are not treated at the clinic.  ° ° °Dental Care: °Organization         Address  Phone  Notes  °[place] [facility] [address], [place] (336) 641-6152 Accepts children up to age 21 who are enrolled in Medicaid or [place] Health Choice; pregnant women with a Medicaid card; and children who have applied for Medicaid or [place] Health Choice, but were declined, whose parents can pay a reduced fee at time of service.  °[place]  [facility] [place]  [address], [place] (336) 641-7733 Accepts children up to age 21 who are enrolled in Medicaid or [place] Health Choice; pregnant women with a Medicaid card; and children who have applied for Medicaid or [place] Health Choice, but were declined, whose parents can pay a reduced fee at time of service.  °[facility] ° [address], [place] (336) 641-4533 Patients are seen by appointment only. Walk-ins are not accepted. [facility] will see patients 18 years of age and older. °Monday - Tuesday (8am-5pm) °Most Wednesdays (8:30-5pm) °$30 per visit, cash only  °[facility] ° [address], [place] (336) 641-4533 Patients are seen by appointment only. Walk-ins are not accepted. [facility] will see patients 18 years of age and older. °One   Wednesday Evening (Monthly: Volunteer Based).  $30 per visit, cash only  °[facility]  (919) 537-3737 for adults; Children under age 4, call Graduate Pediatric Dentistry at ([phone]) [phone]. Children aged 4-14, please call (919) [phone] to request a pediatric application. ° Dental services are provided in all areas of dental care including fillings, crowns and bridges, complete and partial dentures, implants, gum treatment, root canals, and extractions. Preventive care is also provided. Treatment is provided to both adults and children. °Patients are selected via a lottery and there is often a waiting list. °  °[facility] [address], °[place] ° (336) 763-8833 [email or web address] °  °[facility] [address], [place], [place] (336)723-1848, Ext. 123 Second and Fourth Thursday of each month, opens at 6:30 AM; Clinic ends at 9 AM.  Patients are seen on a first-come first-served basis, and a limited number are seen during each clinic.  ° °[facility] ° [address], [place], [place] (336) 723-7904    Eligibility Requirements °You must have lived in [place], [place], or [place] counties for at least the last three months. °  You cannot be eligible for state or federal sponsored healthcare insurance, including Veterans Administration, Medicaid, or Medicare. °  You generally cannot be eligible for healthcare insurance through your employer.  °  How to apply: °Eligibility screenings are held every Tuesday and Wednesday afternoon from 1:00 pm until 4:00 pm. You do not need an appointment for the interview!  °[facility] [address], [place], [place] 336-631-2330   °[facility]  336-342-8273   °[facility]  336-703-3100   °[facility]  336-570-6415   ° °Behavioral Health Resources in the Community: °Intensive Outpatient Programs °Organization         Address  Phone  Notes  °[facility] [address], [place], [place] 336-878-6098   °[facility] [address], [place], [place] 336-832-9800   °[facility] [address], [place], [place] ° 336-882-2125   °[facility] [address],  °[place], [place] 1-800-853-5163 or 336-641-4981   °Substance Abuse Resources °Organization         Address  Phone  Notes  °[facility]  336-882-2125   °[facility]  336-784-9470   °[facility]  336-285-9073   °[facility]  336-845-3988   °Residential & Outpatient Substance Abuse Program  1-800-659-3381   °Psychological Services °Organization         Address  Phone  Notes  °[facility]  336- 832-9600   °[facility]  336- 378-7881   °[facility] [address], [place] 1-800-853-5163 or 336-641-4981   ° °Mobile Crisis Teams °Organization         Address  Phone  Notes  °[facility], Mobile Crisis Care Unit  1-877-626-1772   °Assertive °[facility] ° [address]  [place], [place] 336-834-9664   °Sharon DeEsch [address] °[place] 336-554-5454   ° °Self-Help/Support Groups °Organization         Address  Phone             Notes  °[facility] - variety of support groups  336- 373-1402 Call for more information  °Narcotics Anonymous (NA), [facility] [address], °[place]  2 meetings at this location  ° °  Residential Treatment Programs °Organization         Address  Phone  Notes  °[facility] [address],    °[place]  1-866-801-8205   °[facility] ° [address], [place], [place] 704-293-8524   °[facility] [address], [place] 336-845-3988 Admissions: 8am-3pm M-F  °[facility] [address]    °[place], [place] 336-841-1104   °[facility] [address], [place], [place] 336-379-7146   °[facility] [address]  °[place], [place] 336-285-9073   °[facility] - Intensive Outpatient [address], [place], [place] 336-852-3033   °[facility] [address]  °[place], [place] 1-877-615-2722 or 336-784-9470   °[facility] [address] [place], [place] 336-227-7417 Accepts Medicaid  °[facility] [address]  °[place] 1-800-659-3381 Substance Abuse/Addiction Treatment  ° °[place] Behavioral Health Resources °Organization         Address  Phone  Notes  °[facility]  (888) 581-9988   °Julie Brannon, PhD [address] [place], [place]   (336) 349-5553 or (336) 951-0000   °[facility]   [address] °[place], [place] (336) 349-4454   °[facility] [address], [place], [place] (336) 342-8316 Insurance/Medicaid/sponsorship through [facility]  °[facility] [address]                                    [place], [place] (336) 342-8316 Therapy/tele-psych/case    °[facility] [address]  ° [place], [place] (336) 349-2233    °Dr. Arfeen  (336) 349-4544   °[facility] [place]  [employer] [place] [facility] 1) [address], [place] °2) [address], [place] °3)  [address], [place] (336) 349-3220 °(336) 342-7768 ° °(336) 342-8140   °[place] [facility] (336) 342-1394 or (336) 342-3537 (After Hours)    ° °Take the prescriptions as directed.  Apply moist heat or ice to the area(s) of discomfort, for 15 minutes at a time, several times per day for the next few days.  Do not fall asleep on a heating or ice pack.  Call your regular medical doctor tomorrow to schedule a follow up appointment in the next 2 days.  Return to the Emergency Department immediately if worsening. ° °
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
## 2014-09-25 ENCOUNTER — Emergency Department (HOSPITAL_COMMUNITY)
Admission: EM | Admit: 2014-09-25 | Discharge: 2014-09-25 | Disposition: A | Discharge status: Home / Self Care | Payer: 59 | Attending: Emergency Medicine | Admitting: Emergency Medicine

## 2014-09-25 ENCOUNTER — Encounter (HOSPITAL_COMMUNITY): Payer: Self-pay | Admitting: *Deleted

## 2014-09-25 DIAGNOSIS — Z8669 Personal history of other diseases of the nervous system and sense organs: Secondary | ICD-10-CM | POA: Insufficient documentation

## 2014-09-25 DIAGNOSIS — L729 Follicular cyst of the skin and subcutaneous tissue, unspecified: Secondary | ICD-10-CM | POA: Diagnosis not present

## 2014-09-25 DIAGNOSIS — Z8659 Personal history of other mental and behavioral disorders: Secondary | ICD-10-CM | POA: Insufficient documentation

## 2014-09-25 DIAGNOSIS — Z87442 Personal history of urinary calculi: Secondary | ICD-10-CM | POA: Diagnosis not present

## 2014-09-25 DIAGNOSIS — Z79899 Other long term (current) drug therapy: Secondary | ICD-10-CM | POA: Insufficient documentation

## 2014-09-25 DIAGNOSIS — Z720 Tobacco use: Secondary | ICD-10-CM | POA: Diagnosis not present

## 2014-09-25 DIAGNOSIS — R229 Localized swelling, mass and lump, unspecified: Secondary | ICD-10-CM | POA: Diagnosis present

## 2014-09-25 MED ORDER — SULFAMETHOXAZOLE-TRIMETHOPRIM 800-160 MG PO TABS: 1.0000 | ORAL_TABLET | Freq: Two times a day (BID) | ORAL | Status: AC

## 2014-09-25 MED ORDER — NAPROXEN 500 MG PO TABS: 500.0000 mg | ORAL_TABLET | Freq: Two times a day (BID) | ORAL | Status: DC

## 2014-09-25 NOTE — ED Notes (Signed)
Pt c/o mass under right arm; pt states it has been there for about a year

## 2014-09-25 NOTE — ED Provider Notes (Signed)
CSN: [id number]     Arrival date & time 09/25/14  0042 History   First MD Initiated Contact with Patient 09/25/14 0055     No chief complaint on file.    (Consider location/radiation/quality/duration/timing/severity/associated sxs/prior Treatment) HPI   Marie Brown is a 24 y.o. female who presents to the Emergency Department complaining of tender "knot" to her right axilla for approximately one year.  She reports tenderness and swelling to her underarm that is worse with palpation and arm movement and pain has increased this week.  She has tried squeezing the area without relief or drainage.  She denies redness, fever, or chills. She also denies recent change to soap or deodorant.  Past Medical History  Diagnosis Date  . Carpal tunnel syndrome   . Bipolar 2 disorder   . Kidney stones    Past Surgical History  Procedure Laterality Date  . Appendectomy    . Foot surgery Bilateral 2005   Family History  Problem Relation Age of Onset  . Hypertension Mother   . Diabetes Other   . Thyroid disease Other   . Heart disease Other   . Birth defects Other   . Mental illness Other   . Stroke Other    History  Substance Use Topics  . Smoking status: Current Every Day Smoker -- 0.50 packs/day for 3 years    Types: Cigarettes    Last Attempt to Quit: 03/10/2014  . Smokeless tobacco: Never Used  . Alcohol Use: Yes     Comment: occ.   OB History    Gravida Para Term Preterm AB TAB SAB Ectopic Multiple Living   0 0 0 0 0 0 0 0 0 0      Review of Systems  Constitutional: Negative for fever and chills.  Gastrointestinal: Negative for nausea and vomiting.  Musculoskeletal: Negative for joint swelling and arthralgias.  Skin: Positive for color change.       Painful "knot" to right underarm  Hematological: Negative for adenopathy.  All other systems reviewed and are negative.     Allergies  Rubbing alcohol  Home Medications   Prior to Admission medications   Medication  Sig Start Date End Date Taking? Authorizing Provider  acetaminophen (TYLENOL) 500 MG tablet Take 1,500 mg by mouth every 6 (six) hours as needed for pain.    Historical Provider, MD  cyclobenzaprine (FLEXERIL) 10 MG tablet Take 1 tablet (10 mg total) by mouth 3 (three) times daily as needed for muscle spasms. Patient not taking: Reported on 04/07/2014 05/05/13   Florian Buff, MD  HYDROcodone-acetaminophen (NORCO/VICODIN) 5-325 MG per tablet Take 1 tablet by mouth every 4 (four) hours as needed. Patient taking differently: Take 1 tablet by mouth every 4 (four) hours as needed for moderate pain.  08/13/14   Kristen N Ward, DO  ibuprofen (ADVIL,MOTRIN) 800 MG tablet Take 1 tablet (800 mg total) by mouth every 8 (eight) hours as needed for mild pain. 08/13/14   Kristen N Ward, DO  methocarbamol (ROBAXIN) 500 MG tablet Take 2 tablets (1,000 mg total) by mouth 4 (four) times daily as needed for muscle spasms (muscle spasm/pain). 08/15/14   Francine Graven, DO  Multiple Vitamin (MULTIVITAMIN WITH MINERALS) TABS tablet Take 1 tablet by mouth daily.    Historical Provider, MD  ondansetron (ZOFRAN) 4 MG tablet Take 1 tablet (4 mg total) by mouth every 8 (eight) hours as needed for nausea or vomiting. 08/15/14   Francine Graven, DO  oxyCODONE-acetaminophen (PERCOCET/ROXICET) 5-325 MG  per tablet Take 1 tablet by mouth every 4 (four) hours as needed for pain. Patient not taking: Reported on 04/07/2014 08/17/12   Evalee Jefferson, PA-C  promethazine (PHENERGAN) 25 MG tablet Take 1 tablet (25 mg total) by mouth every 6 (six) hours as needed. Patient not taking: Reported on 05/10/2014 04/07/14   Nat Christen, MD   BP 130/71 mmHg  Pulse 83  Temp(Src) 98.2 F (36.8 C) (Oral)  Resp 20  Ht 5\' 3"  (1.6 m)  Wt 145 lb (65.772 kg)  BMI 25.69 kg/m2  SpO2 100%  LMP 09/09/2014 Physical Exam  Constitutional: She is oriented to person, place, and time. She appears well-developed and well-nourished. No distress.  HENT:  Head:  Normocephalic and atraumatic.  Neck: Normal range of motion. Neck supple.  Cardiovascular: Normal rate, regular rhythm and normal heart sounds.   No murmur heard. Pulmonary/Chest: Effort normal and breath sounds normal. No respiratory distress.  Lymphadenopathy:    She has no cervical adenopathy.  Neurological: She is alert and oriented to person, place, and time. She exhibits normal muscle tone. Coordination normal.  Skin: Skin is warm and dry. There is erythema.  Dime sized fluctuant nodule to the right axilla. No erythema or drainage   Nursing note and vitals reviewed.   ED Course  Procedures (including critical care time) Labs Review Labs Reviewed - No data to display  Imaging Review No results found.   EKG Interpretation None      INCISION AND DRAINAGE Performed by: Hale Bogus. Consent: Verbal consent obtained. Risks and benefits: risks, benefits and alternatives were discussed Type: nodule Body area: right axilla  Anesthesia: local infiltration  Needle aspiration attempted using an 18 g needle, no drainage  Local anesthetic: lidocaine 1 % w/o epinephrine  Anesthetic total: 2 ml  Complexity: simple  Drainage: none  Patient tolerance: Patient tolerated the procedure well with no immediate complications.    MDM   Final diagnoses:  Skin cyst    Pt is well appearing, VSS.  Small area of fluctuance to the right axilla without erythema, excessive warmth or induration.  Pt agrees to abx, warm compresses, PMD f/u or to return here for any worsening sx's    Kem Parkinson, PA-C 09/25/14 [place], PA-C 09/27/14 0022  Rolland Porter, MD 09/30/14 2259

## 2014-09-25 NOTE — Discharge Instructions (Signed)
Epidermal Cyst An epidermal cyst is sometimes called a sebaceous cyst, epidermal inclusion cyst, or infundibular cyst. These cysts usually contain a substance that looks "pasty" or "cheesy" and may have a bad smell. This substance is a protein called keratin. Epidermal cysts are usually found on the face, neck, or trunk. They may also occur in the vaginal area or other parts of the genitalia of both men and women. Epidermal cysts are usually small, painless, slow-growing bumps or lumps that move freely under the skin. It is important not to try to pop them. This may cause an infection and lead to tenderness and swelling. CAUSES  Epidermal cysts may be caused by a deep penetrating injury to the skin or a plugged hair follicle, often associated with acne. SYMPTOMS  Epidermal cysts can become inflamed and cause:  Redness.  Tenderness.  Increased temperature of the skin over the bumps or lumps.  Grayish-white, bad smelling material that drains from the bump or lump. DIAGNOSIS  Epidermal cysts are easily diagnosed by your caregiver during an exam. Rarely, a tissue sample (biopsy) may be taken to rule out other conditions that may resemble epidermal cysts. TREATMENT   Epidermal cysts often get better and disappear on their own. They are rarely ever cancerous.  If a cyst becomes infected, it may become inflamed and tender. This may require opening and draining the cyst. Treatment with antibiotics may be necessary. When the infection is gone, the cyst may be removed with minor surgery.  Small, inflamed cysts can often be treated with antibiotics or by injecting steroid medicines.  Sometimes, epidermal cysts become large and bothersome. If this happens, surgical removal in your caregiver's office may be necessary. HOME CARE INSTRUCTIONS  Only take over-the-counter or prescription medicines as directed by your caregiver.  Take your antibiotics as directed. Finish them even if you start to feel  better. SEEK MEDICAL CARE IF:   Your cyst becomes tender, red, or swollen.  Your condition is not improving or is getting worse.  You have any other questions or concerns. MAKE SURE YOU:  Understand these instructions.  Will watch your condition.  Will get help right away if you are not doing well or get worse. Document Released: 04/05/2004 Document Revised: 07/28/2011 Document Reviewed: 11/11/2010 [employer] Patient Information 2015 [employer]. This information is not intended to replace advice given to you by your health care provider. Make sure you discuss any questions you have with your health care provider.  

## 2015-03-15 ENCOUNTER — Encounter (HOSPITAL_COMMUNITY): Payer: Self-pay | Admitting: Emergency Medicine

## 2015-03-15 ENCOUNTER — Emergency Department (HOSPITAL_COMMUNITY): Payer: 59

## 2015-03-15 ENCOUNTER — Emergency Department (HOSPITAL_COMMUNITY)
Admission: EM | Admit: 2015-03-15 | Discharge: 2015-03-15 | Disposition: A | Discharge status: Home / Self Care | Payer: Self-pay | Attending: Emergency Medicine | Admitting: Emergency Medicine

## 2015-03-15 DIAGNOSIS — N201 Calculus of ureter: Secondary | ICD-10-CM

## 2015-03-15 DIAGNOSIS — Z79899 Other long term (current) drug therapy: Secondary | ICD-10-CM | POA: Insufficient documentation

## 2015-03-15 DIAGNOSIS — Z8659 Personal history of other mental and behavioral disorders: Secondary | ICD-10-CM | POA: Insufficient documentation

## 2015-03-15 DIAGNOSIS — Z3202 Encounter for pregnancy test, result negative: Secondary | ICD-10-CM | POA: Insufficient documentation

## 2015-03-15 DIAGNOSIS — R109 Unspecified abdominal pain: Secondary | ICD-10-CM

## 2015-03-15 DIAGNOSIS — Z720 Tobacco use: Secondary | ICD-10-CM | POA: Insufficient documentation

## 2015-03-15 DIAGNOSIS — Z8669 Personal history of other diseases of the nervous system and sense organs: Secondary | ICD-10-CM | POA: Insufficient documentation

## 2015-03-15 LAB — CBC WITH DIFFERENTIAL/PLATELET
Basophils Absolute: 0 10*3/uL (ref 0.0–0.1)
Basophils Relative: 0 %
Eosinophils Absolute: 0.1 10*3/uL (ref 0.0–0.7)
Eosinophils Relative: 1 %
HCT: 39.3 % (ref 36.0–46.0)
Hemoglobin: 13 g/dL (ref 12.0–15.0)
Lymphocytes Relative: 21 %
Lymphs Abs: 1.9 10*3/uL (ref 0.7–4.0)
MCH: 31.7 pg (ref 26.0–34.0)
MCHC: 33.1 g/dL (ref 30.0–36.0)
MCV: 95.9 fL (ref 78.0–100.0)
Monocytes Absolute: 0.6 10*3/uL (ref 0.1–1.0)
Monocytes Relative: 6 %
Neutro Abs: 6.6 10*3/uL (ref 1.7–7.7)
Neutrophils Relative %: 72 %
Platelets: 298 10*3/uL (ref 150–400)
RBC: 4.1 MIL/uL (ref 3.87–5.11)
RDW: 13 % (ref 11.5–15.5)
WBC: 9.1 10*3/uL (ref 4.0–10.5)

## 2015-03-15 LAB — BASIC METABOLIC PANEL
Anion gap: 9 (ref 5–15)
BUN: 15 mg/dL (ref 6–20)
CO2: 22 mmol/L (ref 22–32)
Calcium: 8.9 mg/dL (ref 8.9–10.3)
Chloride: 107 mmol/L (ref 101–111)
Creatinine, Ser: 0.93 mg/dL (ref 0.44–1.00)
GFR calc Af Amer: >60 mL/min (ref >60)
GFR calc non Af Amer: >60 mL/min (ref >60)
Glucose, Bld: 104 mg/dL — ABNORMAL HIGH (ref 65–99)
Potassium: 5 mmol/L (ref 3.5–5.1)
Sodium: 138 mmol/L (ref 135–145)

## 2015-03-15 LAB — URINALYSIS, ROUTINE W REFLEX MICROSCOPIC
Bilirubin Urine: NEGATIVE
Glucose, UA: NEGATIVE mg/dL
Ketones, ur: NEGATIVE mg/dL
Leukocytes, UA: NEGATIVE
Nitrite: NEGATIVE
Specific Gravity, Urine: 1.02 (ref 1.005–1.030)
Urobilinogen, UA: 0.2 mg/dL (ref 0.0–1.0)
pH: 7 (ref 5.0–8.0)

## 2015-03-15 LAB — PREGNANCY, URINE: Preg Test, Ur: NEGATIVE

## 2015-03-15 LAB — URINE MICROSCOPIC-ADD ON

## 2015-03-15 MED ORDER — SODIUM CHLORIDE 0.9 % IV BOLUS (SEPSIS)
1000.0000 mL | Freq: Once | INTRAVENOUS | Status: AC
  Administered 2015-03-15: 1000 mL via INTRAVENOUS

## 2015-03-15 MED ORDER — ONDANSETRON HCL 4 MG/2ML IJ SOLN
4.0000 mg | Freq: Once | INTRAMUSCULAR | Status: AC
  Administered 2015-03-15: 4 mg via INTRAMUSCULAR
  Filled 2015-03-15: qty 2

## 2015-03-15 MED ORDER — HYDROMORPHONE HCL 1 MG/ML IJ SOLN
1.0000 mg | Freq: Once | INTRAMUSCULAR | Status: AC
  Administered 2015-03-15: 1 mg via INTRAVENOUS
  Filled 2015-03-15: qty 1

## 2015-03-15 MED ORDER — OXYCODONE-ACETAMINOPHEN 5-325 MG PO TABS: 1.0000 | ORAL_TABLET | ORAL | Status: DC | PRN

## 2015-03-15 MED ORDER — MORPHINE SULFATE (PF) 4 MG/ML IV SOLN
4.0000 mg | Freq: Once | INTRAVENOUS | Status: AC
Start: 2015-03-15 — End: 2015-03-15
  Administered 2015-03-15: 4 mg via INTRAVENOUS
  Filled 2015-03-15: qty 1

## 2015-03-15 MED ORDER — KETOROLAC TROMETHAMINE 30 MG/ML IJ SOLN
15.0000 mg | Freq: Once | INTRAMUSCULAR | Status: AC
  Administered 2015-03-15: 15 mg via INTRAVENOUS
  Filled 2015-03-15: qty 1

## 2015-03-15 NOTE — Discharge Instructions (Signed)
Kidney Stones °Kidney stones (urolithiasis) are deposits that form inside your kidneys. The intense pain is caused by the stone moving through the urinary tract. When the stone moves, the ureter goes into spasm around the stone. The stone is usually passed in the urine.  °CAUSES  °· A disorder that makes certain neck glands produce too much parathyroid hormone (primary hyperparathyroidism). °· A buildup of uric acid crystals, similar to gout in your joints. °· Narrowing (stricture) of the ureter. °· A kidney obstruction present at birth (congenital obstruction). °· Previous surgery on the kidney or ureters. °· Numerous kidney infections. °SYMPTOMS  °· Feeling sick to your stomach (nauseous). °· Throwing up (vomiting). °· Blood in the urine (hematuria). °· Pain that usually spreads (radiates) to the groin. °· Frequency or urgency of urination. °DIAGNOSIS  °· Taking a history and physical exam. °· Blood or urine tests. °· CT scan. °· Occasionally, an examination of the inside of the urinary bladder (cystoscopy) is performed. °TREATMENT  °· Observation. °· Increasing your fluid intake. °· Extracorporeal shock wave lithotripsy--This is a noninvasive procedure that uses shock waves to break up kidney stones. °· Surgery may be needed if you have severe pain or persistent obstruction. There are various surgical procedures. Most of the procedures are performed with the use of small instruments. Only small incisions are needed to accommodate these instruments, so recovery time is minimized. °The size, location, and chemical composition are all important variables that will determine the proper choice of action for you. Talk to your health care provider to better understand your situation so that you will minimize the risk of injury to yourself and your kidney.  °HOME CARE INSTRUCTIONS  °· Drink enough water and fluids to keep your urine clear or pale yellow. This will help you to pass the stone or stone fragments. °· Strain  all urine through the provided strainer. Keep all particulate matter and stones for your health care provider to see. The stone causing the pain may be as small as a grain of salt. It is very important to use the strainer each and every time you pass your urine. The collection of your stone will allow your health care provider to analyze it and verify that a stone has actually passed. The stone analysis will often identify what you can do to reduce the incidence of recurrences. °· Only take over-the-counter or prescription medicines for pain, discomfort, or fever as directed by your health care provider. °· Keep all follow-up visits as told by your health care provider. This is important. °· Get follow-up X-rays if required. The absence of pain does not always mean that the stone has passed. It may have only stopped moving. If the urine remains completely obstructed, it can cause loss of kidney function or even complete destruction of the kidney. It is your responsibility to make sure X-rays and follow-ups are completed. Ultrasounds of the kidney can show blockages and the status of the kidney. Ultrasounds are not associated with any radiation and can be performed easily in a matter of minutes. °· Make changes to your daily diet as told by your health care provider. You may be told to: °¨ Limit the amount of salt that you eat. °¨ Eat 5 or more servings of fruits and vegetables each day. °¨ Limit the amount of meat, poultry, fish, and eggs that you eat. °· Collect a 24-hour urine sample as told by your health care provider. You may need to collect another urine sample every 6-12   months. °SEEK MEDICAL CARE IF: °· You experience pain that is progressive and unresponsive to any pain medicine you have been prescribed. °SEEK IMMEDIATE MEDICAL CARE IF:  °· Pain cannot be controlled with the prescribed medicine. °· You have a fever or shaking chills. °· The severity or intensity of pain increases over 18 hours and is not  relieved by pain medicine. °· You develop a new onset of abdominal pain. °· You feel faint or pass out. °· You are unable to urinate. °  °This information is not intended to replace advice given to you by your health care provider. Make sure you discuss any questions you have with your health care provider. °  °Document Released: 05/05/2005 Document Revised: 01/24/2015 Document Reviewed: 10/06/2012 °[employer] Interactive Patient Education ©2016 [employer] ° °

## 2015-03-15 NOTE — ED Notes (Signed)
Pt c/o of 10/10 LT sided flank pain radiating to lower back. Pt reports nausea and vomiting. Denies urinary symptoms. Pt hx of kidney stones.

## 2015-03-15 NOTE — ED Provider Notes (Signed)
CSN: [id number]     Arrival date & time 03/15/15  2229 History  By signing my name below, I, Emmanuella Mensah, attest that this documentation has been prepared under the direction and in the presence of Virgel Manifold, MD. Electronically Signed: Judithann Sauger, ED Scribe. 03/15/2015. 10:32 AM.    Chief Complaint  Patient presents with  . Flank Pain   Patient is a 24 y.o. female presenting with flank pain. The history is provided by the patient. No language interpreter was used.  Flank Pain The current episode started 1 to 2 hours ago. The problem occurs constantly. The problem has been gradually worsening. Pertinent negatives include no abdominal pain and no shortness of breath. The symptoms are aggravated by exertion. Nothing relieves the symptoms. She has tried nothing for the symptoms.   HPI Comments: Marie Brown is a 24 y.o. female with a hx of kidney stones who presents to the Emergency Department complaining of gradually worsening sudden onset of left flank pain worse with deep breathing onset this am. Pt reports associated nausea and vomiting. She denies any dysuria. She reports NKDA. She states that these symptoms are similar to when she had a kidney stone in the past. No alleviating factors noted.   Past Medical History  Diagnosis Date  . Carpal tunnel syndrome   . Bipolar 2 disorder ([place])   . Kidney stones    Past Surgical History  Procedure Laterality Date  . Appendectomy    . Foot surgery Bilateral 2005   Family History  Problem Relation Age of Onset  . Hypertension Mother   . Diabetes Other   . Thyroid disease Other   . Heart disease Other   . Birth defects Other   . Mental illness Other   . Stroke Other    Social History  Substance Use Topics  . Smoking status: Current Every Day Smoker -- 0.50 packs/day for 3 years    Types: Cigarettes    Last Attempt to Quit: 03/10/2014  . Smokeless tobacco: Never Used  . Alcohol Use: Yes     Comment: occ.   OB  History    Gravida Para Term Preterm AB TAB SAB Ectopic Multiple Living   0 0 0 0 0 0 0 0 0 0      Review of Systems  Respiratory: Negative for shortness of breath.   Gastrointestinal: Negative for abdominal pain.  Genitourinary: Positive for flank pain.  All other systems reviewed and are negative.     Allergies  Rubbing alcohol  Home Medications   Prior to Admission medications   Medication Sig Start Date End Date Taking? Authorizing Provider  acetaminophen (TYLENOL) 500 MG tablet Take 1,500 mg by mouth every 6 (six) hours as needed for pain.    Historical Provider, MD  cyclobenzaprine (FLEXERIL) 10 MG tablet Take 1 tablet (10 mg total) by mouth 3 (three) times daily as needed for muscle spasms. Patient not taking: Reported on 04/07/2014 05/05/13   Florian Buff, MD  HYDROcodone-acetaminophen (NORCO/VICODIN) 5-325 MG per tablet Take 1 tablet by mouth every 4 (four) hours as needed. Patient taking differently: Take 1 tablet by mouth every 4 (four) hours as needed for moderate pain.  08/13/14   Kristen N Ward, DO  ibuprofen (ADVIL,MOTRIN) 800 MG tablet Take 1 tablet (800 mg total) by mouth every 8 (eight) hours as needed for mild pain. 08/13/14   Kristen N Ward, DO  methocarbamol (ROBAXIN) 500 MG tablet Take 2 tablets (1,000 mg total) by mouth  4 (four) times daily as needed for muscle spasms (muscle spasm/pain). 08/15/14   Francine Graven, DO  Multiple Vitamin (MULTIVITAMIN WITH MINERALS) TABS tablet Take 1 tablet by mouth daily.    Historical Provider, MD  naproxen (NAPROSYN) 500 MG tablet Take 1 tablet (500 mg total) by mouth 2 (two) times daily with a meal. 09/25/14   Tammy Triplett, PA-C  ondansetron (ZOFRAN) 4 MG tablet Take 1 tablet (4 mg total) by mouth every 8 (eight) hours as needed for nausea or vomiting. 08/15/14   Francine Graven, DO  oxyCODONE-acetaminophen (PERCOCET/ROXICET) 5-325 MG per tablet Take 1 tablet by mouth every 4 (four) hours as needed for pain. Patient not  taking: Reported on 04/07/2014 08/17/12   Evalee Jefferson, PA-C  promethazine (PHENERGAN) 25 MG tablet Take 1 tablet (25 mg total) by mouth every 6 (six) hours as needed. Patient not taking: Reported on 05/10/2014 04/07/14   Nat Christen, MD   BP 134/87 mmHg  Pulse 93  Temp(Src) 97.6 F (36.4 C) (Oral)  Resp 22  Ht 5\' 3"  (1.6 m)  Wt 157 lb (71.215 kg)  BMI 27.82 kg/m2  SpO2 100%  LMP 03/10/2015 Physical Exam  Constitutional: She is oriented to person, place, and time. She appears well-developed and well-nourished. No distress.  Pt seems very uncomfortable, crying  HENT:  Head: Normocephalic and atraumatic.  Eyes: EOM are normal.  Neck: Normal range of motion.  Cardiovascular: Normal rate, regular rhythm and normal heart sounds.   Pulmonary/Chest: Effort normal and breath sounds normal.  Abdominal: Soft. She exhibits no distension. There is tenderness.  Tenderness on left abdomen  Musculoskeletal: Normal range of motion.  Neurological: She is alert and oriented to person, place, and time.  Skin: Skin is warm and dry.  Psychiatric: She has a normal mood and affect. Judgment normal.  Nursing note and vitals reviewed.   ED Course  Procedures (including critical care time) DIAGNOSTIC STUDIES: Oxygen Saturation is 100% on RA, normal by my interpretation.    COORDINATION OF CARE: 10:10 AM- Pt advised of plan for treatment and pt agrees.    Labs Review Labs Reviewed - No data to display  Imaging Review Ct Renal Stone Study  03/15/2015  CLINICAL DATA:  Left-sided flank pain with nausea and vomiting EXAM: CT ABDOMEN AND PELVIS WITHOUT CONTRAST TECHNIQUE: Multidetector CT imaging of the abdomen and pelvis was performed following the standard protocol without oral or intravenous contrast material administration. COMPARISON:  August 30, 2012 FINDINGS: Lower chest: There is mild bibasilar lung scarring. No edema or consolidation is noted in the lung bases. Hepatobiliary: No focal liver lesions  are identified on this noncontrast enhanced study. Gallbladder wall is not appreciably thickened. There is no biliary duct dilatation. Next no mass or inflammatory focus. Pancreas: No mass or inflammatory focus. Spleen:  No splenic lesions are identified. Adrenal/Urinary Tract: Adrenals bilaterally appear unremarkable. There is a 1 mm nonobstructing calculus in the upper to mid right kidney. There is a 2 mm calculus in the lower pole of the left kidney. There is no renal mass on either side. There is no hydronephrosis or ureteral calculus on the right. On the left, there is mild hydronephrosis. There is a 2 mm calculus in the distal left ureter. No other ureteral calculi are identified. Urinary bladder is midline with wall thickness within normal limits. Stomach/Bowel: There is no bowel wall or mesenteric thickening. No bowel obstruction. No free air or portal venous air. Vascular/Lymphatic: There is no abdominal aortic aneurysm. No vascular  lesion is identified on this noncontrast enhanced study. No adenopathy is seen in the abdomen or pelvis. Reproductive: Uterus is normal in size and configuration by CT. There is no pelvic mass or pelvic fluid collection. Uterus is anteverted. Other: Appendix is absent. No abscess or ascites is noted in the abdomen or pelvis. Musculoskeletal: There are no blastic or lytic bone lesions. No intramuscular or abdominal wall lesions. IMPRESSION: 2 mm calculus distal left ureter causing mild hydronephrosis on the left. There are small calculi in each kidney. No hydronephrosis on the right. Appendix absent. No bowel obstruction.  No abscess. Electronically Signed   By: Lowella Grip III M.D.   On: 03/15/2015 12:32   Virgel Manifold, MD has personally reviewed and evaluated these images and lab results as part of his medical decision-making.   EKG Interpretation None      MDM   Final diagnoses:  Left ureteral stone    24 year old female with left flank pain. Imaging  significant for a 2 mm left distal ureteral stone. Symptoms currently controlled. Afebrile and nontoxic. Renal function is fine. Plan expectant management/pain control. Return precautions discussed. Urology follow-up as needed otherwise.  I personally preformed the services scribed in my presence. The recorded information has been reviewed is accurate. Virgel Manifold, MD.    Virgel Manifold, MD 03/25/15 2119

## 2015-12-20 ENCOUNTER — Encounter (HOSPITAL_COMMUNITY): Payer: Self-pay | Admitting: Emergency Medicine

## 2015-12-20 ENCOUNTER — Emergency Department (HOSPITAL_COMMUNITY)
Admission: EM | Admit: 2015-12-20 | Discharge: 2015-12-20 | Disposition: A | Discharge status: Home / Self Care | Payer: Self-pay | Attending: Emergency Medicine | Admitting: Emergency Medicine

## 2015-12-20 DIAGNOSIS — Z791 Long term (current) use of non-steroidal anti-inflammatories (NSAID): Secondary | ICD-10-CM | POA: Insufficient documentation

## 2015-12-20 DIAGNOSIS — G43009 Migraine without aura, not intractable, without status migrainosus: Secondary | ICD-10-CM

## 2015-12-20 DIAGNOSIS — K529 Noninfective gastroenteritis and colitis, unspecified: Secondary | ICD-10-CM | POA: Insufficient documentation

## 2015-12-20 DIAGNOSIS — Z7982 Long term (current) use of aspirin: Secondary | ICD-10-CM | POA: Insufficient documentation

## 2015-12-20 DIAGNOSIS — G43909 Migraine, unspecified, not intractable, without status migrainosus: Secondary | ICD-10-CM | POA: Insufficient documentation

## 2015-12-20 DIAGNOSIS — Z79899 Other long term (current) drug therapy: Secondary | ICD-10-CM | POA: Insufficient documentation

## 2015-12-20 DIAGNOSIS — F17210 Nicotine dependence, cigarettes, uncomplicated: Secondary | ICD-10-CM | POA: Insufficient documentation

## 2015-12-20 HISTORY — DX: Migraine, unspecified, not intractable, without status migrainosus: G43.909

## 2015-12-20 LAB — COMPREHENSIVE METABOLIC PANEL
ALT: 11 U/L — ABNORMAL LOW (ref 14–54)
AST: 14 U/L — ABNORMAL LOW (ref 15–41)
Albumin: 4 g/dL (ref 3.5–5.0)
Alkaline Phosphatase: 34 U/L — ABNORMAL LOW (ref 38–126)
Anion gap: 4 — ABNORMAL LOW (ref 5–15)
BUN: 11 mg/dL (ref 6–20)
CO2: 26 mmol/L (ref 22–32)
Calcium: 8.7 mg/dL — ABNORMAL LOW (ref 8.9–10.3)
Chloride: 106 mmol/L (ref 101–111)
Creatinine, Ser: 0.68 mg/dL (ref 0.44–1.00)
GFR calc Af Amer: >60 mL/min (ref >60)
GFR calc non Af Amer: >60 mL/min (ref >60)
Glucose, Bld: 110 mg/dL — ABNORMAL HIGH (ref 65–99)
Potassium: 3.6 mmol/L (ref 3.5–5.1)
Sodium: 136 mmol/L (ref 135–145)
Total Bilirubin: 0.5 mg/dL (ref 0.3–1.2)
Total Protein: 6.7 g/dL (ref 6.5–8.1)

## 2015-12-20 LAB — URINALYSIS, ROUTINE W REFLEX MICROSCOPIC
Bilirubin Urine: NEGATIVE
Glucose, UA: NEGATIVE mg/dL
Ketones, ur: NEGATIVE mg/dL
Leukocytes, UA: NEGATIVE
Nitrite: NEGATIVE
Protein, ur: NEGATIVE mg/dL
Specific Gravity, Urine: 1.01 (ref 1.005–1.030)
pH: 6 (ref 5.0–8.0)

## 2015-12-20 LAB — URINE MICROSCOPIC-ADD ON

## 2015-12-20 LAB — CBC
HCT: 37.4 % (ref 36.0–46.0)
Hemoglobin: 12 g/dL (ref 12.0–15.0)
MCH: 30.6 pg (ref 26.0–34.0)
MCHC: 32.1 g/dL (ref 30.0–36.0)
MCV: 95.4 fL (ref 78.0–100.0)
Platelets: 241 10*3/uL (ref 150–400)
RBC: 3.92 MIL/uL (ref 3.87–5.11)
RDW: 12.5 % (ref 11.5–15.5)
WBC: 4.3 10*3/uL (ref 4.0–10.5)

## 2015-12-20 LAB — PREGNANCY, URINE: Preg Test, Ur: NEGATIVE

## 2015-12-20 LAB — LIPASE, BLOOD: Lipase: 17 U/L (ref 11–51)

## 2015-12-20 MED ORDER — ONDANSETRON HCL 4 MG/2ML IJ SOLN
4.0000 mg | Freq: Once | INTRAMUSCULAR | Status: AC
  Administered 2015-12-20: 4 mg via INTRAVENOUS
  Filled 2015-12-20: qty 2

## 2015-12-20 MED ORDER — SODIUM CHLORIDE 0.9 % IV BOLUS (SEPSIS)
1000.0000 mL | Freq: Once | INTRAVENOUS | Status: AC
  Administered 2015-12-20: 1000 mL via INTRAVENOUS

## 2015-12-20 MED ORDER — ONDANSETRON 4 MG PO TBDP: ORAL_TABLET | ORAL | 0 refills | Status: DC

## 2015-12-20 MED ORDER — KETOROLAC TROMETHAMINE 30 MG/ML IJ SOLN
30.0000 mg | Freq: Once | INTRAMUSCULAR | Status: AC
  Administered 2015-12-20: 30 mg via INTRAVENOUS
  Filled 2015-12-20: qty 1

## 2015-12-20 MED ORDER — IBUPROFEN 800 MG PO TABS: 800.0000 mg | ORAL_TABLET | Freq: Four times a day (QID) | ORAL | 0 refills | Status: DC | PRN

## 2015-12-20 MED ORDER — IBUPROFEN 800 MG PO TABS: 800.0000 mg | ORAL_TABLET | Freq: Three times a day (TID) | ORAL | 0 refills | Status: DC

## 2015-12-20 MED ORDER — HYDROCODONE-ACETAMINOPHEN 5-325 MG PO TABS
1.0000 | ORAL_TABLET | Freq: Once | ORAL | Status: AC
  Administered 2015-12-20: 1 via ORAL
  Filled 2015-12-20: qty 1

## 2015-12-20 NOTE — ED Triage Notes (Signed)
Pt reports vomiting and diarrhea that began last night. States then she developed a migraine. Pt hx of migraines. AOx4.

## 2015-12-20 NOTE — ED Provider Notes (Signed)
[facility] DEPT Provider Note   CSN: [id number] Arrival date & time: 12/20/15  1433  First Provider Contact:  First MD Initiated Contact with Patient 12/20/15 1509        History   Chief Complaint Chief Complaint  Patient presents with  . Migraine    HPI Marie Brown is a 25 y.o. female.    Patient complains of headache and also nausea vomiting diarrhea for 1 day no blood in her vomit or diarrhea   The history is provided by the patient. No language interpreter was used.  Migraine  This is a new problem. The current episode started 12 to 24 hours ago. The problem occurs constantly. The problem has not changed since onset.Pertinent negatives include no chest pain, no abdominal pain and no headaches. Nothing aggravates the symptoms. Nothing relieves the symptoms.    Past Medical History:  Diagnosis Date  . Bipolar 2 disorder ([place])   . Carpal tunnel syndrome   . Kidney stones   . Migraine     Patient Active Problem List   Diagnosis Date Noted  . Dysmenorrhea 11/09/2012  . ACUTE BRONCHOSPASM 12/12/2009  . NECK PAIN 06/13/2009  . NAUSEA AND VOMITING 05/24/2009  . ACUTE LARYNGITIS, WITHOUT MENTION OF OBSTRUCTIO 05/02/2009  . WEIGHT GAIN 04/22/2009  . HEADACHE 04/22/2009  . UNSPECIFIED VISUAL LOSS 01/17/2009  . ACUTE CYSTITIS 01/17/2009  . Vaginitis and vulvovaginitis, unspecified 01/17/2009  . FATIGUE 01/17/2009  . ACUTE SINUSITIS, UNSPECIFIED 11/08/2008  . ACUTE BRONCHITIS 11/08/2008  . BIPOLAR DISORDER UNSPECIFIED 08/12/2008  . NICOTINE ADDICTION 08/12/2008    Past Surgical History:  Procedure Laterality Date  . APPENDECTOMY    . FOOT SURGERY Bilateral 2005    OB History    Gravida Para Term Preterm AB Living   0 0 0 0 0 0   SAB TAB Ectopic Multiple Live Births   0 0 0 0         Home Medications    Prior to Admission medications   Medication Sig Start Date End Date Taking? Authorizing Provider  Aspirin-Salicylamide-Caffeine (BC HEADACHE  POWDER PO) Take 1 Package by mouth daily as needed (headache).   Yes Historical Provider, MD  BIOTIN PO Take 1 tablet by mouth daily.   Yes Historical Provider, MD  Multiple Vitamin (MULTIVITAMIN WITH MINERALS) TABS tablet Take 1 tablet by mouth daily.   Yes Historical Provider, MD  VITAMIN E PO Take 1 tablet by mouth daily.   Yes Historical Provider, MD  ibuprofen (ADVIL,MOTRIN) 800 MG tablet Take 1 tablet (800 mg total) by mouth 3 (three) times daily. 12/20/15   Milton Ferguson, MD  ibuprofen (ADVIL,MOTRIN) 800 MG tablet Take 1 tablet (800 mg total) by mouth every 6 (six) hours as needed for moderate pain. 12/20/15   Milton Ferguson, MD  ondansetron (ZOFRAN ODT) 4 MG disintegrating tablet 4mg  ODT q4 hours prn nausea/vomit 12/20/15   Milton Ferguson, MD  ondansetron (ZOFRAN ODT) 4 MG disintegrating tablet 4mg  ODT q4 hours prn nausea/vomit 12/20/15   Milton Ferguson, MD    Family History Family History  Problem Relation Age of Onset  . Hypertension Mother   . Diabetes Other   . Thyroid disease Other   . Heart disease Other   . Birth defects Other   . Mental illness Other   . Stroke Other     Social History Social History  Substance Use Topics  . Smoking status: Current Every Day Smoker    Packs/day: 0.50    Years:  3.00    Types: Cigarettes    Last attempt to quit: 03/10/2014  . Smokeless tobacco: Never Used  . Alcohol use Yes     Comment: occ.     Allergies   Rubbing alcohol [alcohol]   Review of Systems Review of Systems  Constitutional: Negative for appetite change and fatigue.  HENT: Negative for congestion, ear discharge and sinus pressure.   Eyes: Negative for discharge.  Respiratory: Negative for cough.   Cardiovascular: Negative for chest pain.  Gastrointestinal: Negative for abdominal pain and diarrhea.  Genitourinary: Negative for frequency and hematuria.  Musculoskeletal: Negative for back pain.  Skin: Negative for rash.  Neurological: Negative for seizures and headaches.   Psychiatric/Behavioral: Negative for hallucinations.     Physical Exam Updated Vital Signs BP 141/70 (BP Location: Left Arm)   Pulse 65   Temp 98.7 F (37.1 C) (Oral)   Resp 22   Ht 5\' 3"  (1.6 m)   Wt 145 lb (65.8 kg)   LMP 12/06/2015   SpO2 100%   BMI 25.69 kg/m   Physical Exam  Constitutional: She is oriented to person, place, and time. She appears well-developed.  HENT:  Head: Normocephalic.  Eyes: Conjunctivae and EOM are normal. No scleral icterus.  Neck: Neck supple. No thyromegaly present.  Cardiovascular: Normal rate and regular rhythm.  Exam reveals no gallop and no friction rub.   No murmur heard. Pulmonary/Chest: No stridor. She has no wheezes. She has no rales. She exhibits no tenderness.  Abdominal: She exhibits no distension. There is no tenderness. There is no rebound.  Musculoskeletal: Normal range of motion. She exhibits no edema.  Lymphadenopathy:    She has no cervical adenopathy.  Neurological: She is oriented to person, place, and time. She exhibits normal muscle tone. Coordination normal.  Skin: No rash noted. No erythema.  Psychiatric: She has a normal mood and affect. Her behavior is normal.     ED Treatments / Results  Labs (all labs ordered are listed, but only abnormal results are displayed) Labs Reviewed  COMPREHENSIVE METABOLIC PANEL - Abnormal; Notable for the following:       Result Value   Glucose, Bld 110 (*)    Calcium 8.7 (*)    AST 14 (*)    ALT 11 (*)    Alkaline Phosphatase 34 (*)    Anion gap 4 (*)    All other components within normal limits  URINALYSIS, ROUTINE W REFLEX MICROSCOPIC (NOT AT [facility]) - Abnormal; Notable for the following:    Hgb urine dipstick TRACE (*)    All other components within normal limits  URINE MICROSCOPIC-ADD ON - Abnormal; Notable for the following:    Squamous Epithelial / LPF 6-30 (*)    Bacteria, UA FEW (*)    All other components within normal limits  LIPASE, BLOOD  CBC  PREGNANCY, URINE      EKG  EKG Interpretation None       Radiology No results found.  Procedures Procedures (including critical care time)  Medications Ordered in ED Medications  sodium chloride 0.9 % bolus 1,000 mL (1,000 mLs Intravenous New Bag/Given 12/20/15 1538)  ondansetron (ZOFRAN) injection 4 mg (4 mg Intravenous Given 12/20/15 1538)  ketorolac (TORADOL) 30 MG/ML injection 30 mg (30 mg Intravenous Given 12/20/15 1538)  HYDROcodone-acetaminophen (NORCO/VICODIN) 5-325 MG per tablet 1 tablet (1 tablet Oral Given 12/20/15 1627)     Initial Impression / Assessment and Plan / ED Course  I have reviewed the triage vital  signs and the nursing notes.  Pertinent labs & imaging results that were available during my care of the patient were reviewed by me and considered in my medical decision making (see chart for details).  Clinical Course    Patient with a migraine and gastroenteritis .   Patient improved with fluids and pain medicine will follow-up with her PCP  Final Clinical Impressions(s) / ED Diagnoses   Final diagnoses:  Gastroenteritis  Nonintractable migraine, unspecified migraine type    New Prescriptions New Prescriptions   IBUPROFEN (ADVIL,MOTRIN) 800 MG TABLET    Take 1 tablet (800 mg total) by mouth 3 (three) times daily.   IBUPROFEN (ADVIL,MOTRIN) 800 MG TABLET    Take 1 tablet (800 mg total) by mouth every 6 (six) hours as needed for moderate pain.   ONDANSETRON (ZOFRAN ODT) 4 MG DISINTEGRATING TABLET    4mg  ODT q4 hours prn nausea/vomit   ONDANSETRON (ZOFRAN ODT) 4 MG DISINTEGRATING TABLET    4mg  ODT q4 hours prn nausea/vomit     Milton Ferguson, MD 12/20/15 1725

## 2015-12-20 NOTE — Discharge Instructions (Signed)
Drink plenty of fluids and follow-up with your doctor if not improving °

## 2016-02-15 ENCOUNTER — Encounter (HOSPITAL_COMMUNITY): Payer: Self-pay | Admitting: Emergency Medicine

## 2016-02-15 ENCOUNTER — Emergency Department (HOSPITAL_COMMUNITY)
Admission: EM | Admit: 2016-02-15 | Discharge: 2016-02-15 | Disposition: A | Discharge status: Home / Self Care | Payer: Self-pay | Attending: Emergency Medicine | Admitting: Emergency Medicine

## 2016-02-15 DIAGNOSIS — F17210 Nicotine dependence, cigarettes, uncomplicated: Secondary | ICD-10-CM | POA: Insufficient documentation

## 2016-02-15 DIAGNOSIS — J0100 Acute maxillary sinusitis, unspecified: Secondary | ICD-10-CM | POA: Insufficient documentation

## 2016-02-15 DIAGNOSIS — Z7982 Long term (current) use of aspirin: Secondary | ICD-10-CM | POA: Insufficient documentation

## 2016-02-15 MED ORDER — BENZONATATE 100 MG PO CAPS: 100.0000 mg | ORAL_CAPSULE | Freq: Three times a day (TID) | ORAL | 0 refills | Status: DC

## 2016-02-15 MED ORDER — AMOXICILLIN 500 MG PO CAPS: 500.0000 mg | ORAL_CAPSULE | Freq: Three times a day (TID) | ORAL | 0 refills | Status: DC

## 2016-02-15 NOTE — ED Triage Notes (Signed)
Pt reports productive cough with thick yellowish sputum, sore throat and head congestion that started 3 days ago.

## 2016-02-16 NOTE — ED Provider Notes (Signed)
[facility] DEPT Provider Note   CSN: [id number] Arrival date & time: 02/15/16  1302     History   Chief Complaint Chief Complaint  Patient presents with  . Cough    HPI Marie Brown is a 25 y.o. female.  The history is provided by the patient. No language interpreter was used.  Cough  This is a new problem. The problem occurs constantly. The problem has been gradually worsening. The cough is non-productive. There has been no fever. Pertinent negatives include no shortness of breath. She has tried nothing for the symptoms. The treatment provided no relief. She is not a smoker.    Past Medical History:  Diagnosis Date  . Bipolar 2 disorder ([place])   . Carpal tunnel syndrome   . Kidney stones   . Migraine     Patient Active Problem List   Diagnosis Date Noted  . Dysmenorrhea 11/09/2012  . ACUTE BRONCHOSPASM 12/12/2009  . NECK PAIN 06/13/2009  . NAUSEA AND VOMITING 05/24/2009  . ACUTE LARYNGITIS, WITHOUT MENTION OF OBSTRUCTIO 05/02/2009  . WEIGHT GAIN 04/22/2009  . HEADACHE 04/22/2009  . UNSPECIFIED VISUAL LOSS 01/17/2009  . ACUTE CYSTITIS 01/17/2009  . Vaginitis and vulvovaginitis, unspecified 01/17/2009  . FATIGUE 01/17/2009  . ACUTE SINUSITIS, UNSPECIFIED 11/08/2008  . ACUTE BRONCHITIS 11/08/2008  . BIPOLAR DISORDER UNSPECIFIED 08/12/2008  . NICOTINE ADDICTION 08/12/2008    Past Surgical History:  Procedure Laterality Date  . APPENDECTOMY    . FOOT SURGERY Bilateral 2005    OB History    Gravida Para Term Preterm AB Living   0 0 0 0 0 0   SAB TAB Ectopic Multiple Live Births   0 0 0 0         Home Medications    Prior to Admission medications   Medication Sig Start Date End Date Taking? Authorizing Provider  amoxicillin (AMOXIL) 500 MG capsule Take 1 capsule (500 mg total) by mouth 3 (three) times daily. 02/15/16   Fransico Meadow, PA-C  Aspirin-Salicylamide-Caffeine (BC HEADACHE POWDER PO) Take 1 Package by mouth daily as needed (headache).     Historical Provider, MD  benzonatate (TESSALON) 100 MG capsule Take 1 capsule (100 mg total) by mouth every 8 (eight) hours. 02/15/16   Fransico Meadow, PA-C    Family History Family History  Problem Relation Age of Onset  . Hypertension Mother   . Diabetes Other   . Thyroid disease Other   . Heart disease Other   . Birth defects Other   . Mental illness Other   . Stroke Other     Social History Social History  Substance Use Topics  . Smoking status: Current Every Day Smoker    Packs/day: 0.50    Years: 3.00    Types: Cigarettes    Last attempt to quit: 03/10/2014  . Smokeless tobacco: Never Used  . Alcohol use Yes     Comment: occ.     Allergies   Rubbing alcohol [alcohol]   Review of Systems Review of Systems  Respiratory: Positive for cough. Negative for shortness of breath.   All other systems reviewed and are negative.    Physical Exam Updated Vital Signs BP 140/89 (BP Location: Left Arm)   Pulse 97   Temp 98.3 F (36.8 C) (Oral)   Resp 18   Ht 5\' 3"  (1.6 m)   Wt 64.4 kg   LMP 01/24/2016   SpO2 100%   BMI 25.15 kg/m   Physical Exam  Constitutional:  She appears well-developed and well-nourished. No distress.  HENT:  Head: Normocephalic and atraumatic.  Right Ear: External ear normal.  Left Ear: External ear normal.  Eyes: Conjunctivae are normal.  Neck: Neck supple.  Cardiovascular: Normal rate and regular rhythm.   No murmur heard. Pulmonary/Chest: Breath sounds normal.  Abdominal: Soft. There is no tenderness.  Musculoskeletal: She exhibits no edema.  Neurological: She is alert.  Skin: Skin is warm and dry.  Psychiatric: She has a normal mood and affect.  Nursing note and vitals reviewed.    ED Treatments / Results  Labs (all labs ordered are listed, but only abnormal results are displayed) Labs Reviewed - No data to display  EKG  EKG Interpretation None       Radiology No results found.  Procedures Procedures (including  critical care time)  Medications Ordered in ED Medications - No data to display   Initial Impression / Assessment and Plan / ED Course  I have reviewed the triage vital signs and the nursing notes.  Pertinent labs & imaging results that were available during my care of the patient were reviewed by me and considered in my medical decision making (see chart for details).  Clinical Course      Final Clinical Impressions(s) / ED Diagnoses   Final diagnoses:  Acute maxillary sinusitis, recurrence not specified    New Prescriptions Discharge Medication List as of 02/15/2016  2:29 PM    START taking these medications   Details  amoxicillin (AMOXIL) 500 MG capsule Take 1 capsule (500 mg total) by mouth 3 (three) times daily., Starting Fri 02/15/2016, Print    benzonatate (TESSALON) 100 MG capsule Take 1 capsule (100 mg total) by mouth every 8 (eight) hours., Starting Fri 02/15/2016, [employer], PA-C 02/16/16 [place], MD 02/17/16 [phone]

## 2016-04-03 ENCOUNTER — Emergency Department (HOSPITAL_COMMUNITY)
Admission: EM | Admit: 2016-04-03 | Discharge: 2016-04-03 | Disposition: A | Discharge status: Home / Self Care | Payer: Self-pay | Attending: Emergency Medicine | Admitting: Emergency Medicine

## 2016-04-03 ENCOUNTER — Encounter (HOSPITAL_COMMUNITY): Payer: Self-pay | Admitting: Emergency Medicine

## 2016-04-03 DIAGNOSIS — F17210 Nicotine dependence, cigarettes, uncomplicated: Secondary | ICD-10-CM | POA: Insufficient documentation

## 2016-04-03 DIAGNOSIS — R51 Headache: Secondary | ICD-10-CM | POA: Insufficient documentation

## 2016-04-03 DIAGNOSIS — Z7982 Long term (current) use of aspirin: Secondary | ICD-10-CM | POA: Insufficient documentation

## 2016-04-03 DIAGNOSIS — R519 Headache, unspecified: Secondary | ICD-10-CM

## 2016-04-03 MED ORDER — TRAMADOL HCL 50 MG PO TABS: 50.0000 mg | ORAL_TABLET | Freq: Two times a day (BID) | ORAL | 0 refills | Status: DC | PRN

## 2016-04-03 MED ORDER — PROCHLORPERAZINE EDISYLATE 5 MG/ML IJ SOLN
10.0000 mg | Freq: Once | INTRAMUSCULAR | Status: AC
  Administered 2016-04-03: 10 mg via INTRAVENOUS
  Filled 2016-04-03: qty 2

## 2016-04-03 MED ORDER — KETOROLAC TROMETHAMINE 30 MG/ML IJ SOLN
30.0000 mg | Freq: Once | INTRAMUSCULAR | Status: AC
  Administered 2016-04-03: 30 mg via INTRAVENOUS
  Filled 2016-04-03: qty 1

## 2016-04-03 MED ORDER — DIPHENHYDRAMINE HCL 50 MG/ML IJ SOLN
25.0000 mg | Freq: Once | INTRAMUSCULAR | Status: AC
  Administered 2016-04-03: 25 mg via INTRAVENOUS
  Filled 2016-04-03: qty 1

## 2016-04-03 NOTE — ED Triage Notes (Signed)
Pt reports left sided headache beginning today.  States she has been having unilateral headaches for over a week, but this is the worst.  Pain will switch sides.

## 2016-04-03 NOTE — ED Provider Notes (Signed)
[place] DEPT Provider Note   CSN: [id number] Arrival date & time: 04/03/16  1723     History   Chief Complaint Chief Complaint  Patient presents with  . Migraine    HPI Marie Brown is a 25 y.o. female.  Patient states that she has a bad headache. Patient states this is similar to her headaches. She has a history of migraines   The history is provided by the patient. No language interpreter was used.  Migraine  This is a recurrent problem. The current episode started more than 2 days ago. The problem occurs constantly. The problem has not changed since onset.Pertinent negatives include no chest pain, no abdominal pain and no headaches. The symptoms are aggravated by walking. She has tried acetaminophen for the symptoms. The treatment provided no relief.    Past Medical History:  Diagnosis Date  . Bipolar 2 disorder ([place])   . Carpal tunnel syndrome   . Kidney stones   . Migraine     Patient Active Problem List   Diagnosis Date Noted  . Dysmenorrhea 11/09/2012  . ACUTE BRONCHOSPASM 12/12/2009  . NECK PAIN 06/13/2009  . NAUSEA AND VOMITING 05/24/2009  . ACUTE LARYNGITIS, WITHOUT MENTION OF OBSTRUCTIO 05/02/2009  . WEIGHT GAIN 04/22/2009  . HEADACHE 04/22/2009  . UNSPECIFIED VISUAL LOSS 01/17/2009  . ACUTE CYSTITIS 01/17/2009  . Vaginitis and vulvovaginitis, unspecified 01/17/2009  . FATIGUE 01/17/2009  . ACUTE SINUSITIS, UNSPECIFIED 11/08/2008  . ACUTE BRONCHITIS 11/08/2008  . BIPOLAR DISORDER UNSPECIFIED 08/12/2008  . NICOTINE ADDICTION 08/12/2008    Past Surgical History:  Procedure Laterality Date  . APPENDECTOMY    . FOOT SURGERY Bilateral 2005    OB History    Gravida Para Term Preterm AB Living   0 0 0 0 0 0   SAB TAB Ectopic Multiple Live Births   0 0 0 0         Home Medications    Prior to Admission medications   Medication Sig Start Date End Date Taking? Authorizing Provider  Aspirin-Salicylamide-Caffeine (BC HEADACHE POWDER  PO) Take 1 Package by mouth daily as needed (headache).   Yes Historical Provider, MD  amoxicillin (AMOXIL) 500 MG capsule Take 1 capsule (500 mg total) by mouth 3 (three) times daily. Patient not taking: Reported on 04/03/2016 02/15/16   Fransico Meadow, PA-C  benzonatate (TESSALON) 100 MG capsule Take 1 capsule (100 mg total) by mouth every 8 (eight) hours. Patient not taking: Reported on 04/03/2016 02/15/16   Fransico Meadow, PA-C  traMADol (ULTRAM) 50 MG tablet Take 1 tablet (50 mg total) by mouth every 12 (twelve) hours as needed for severe pain. 04/03/16   Milton Ferguson, MD    Family History Family History  Problem Relation Age of Onset  . Hypertension Mother   . Diabetes Other   . Thyroid disease Other   . Heart disease Other   . Birth defects Other   . Mental illness Other   . Stroke Other     Social History Social History  Substance Use Topics  . Smoking status: Current Every Day Smoker    Packs/day: 0.50    Years: 3.00    Types: Cigarettes    Last attempt to quit: 03/10/2014  . Smokeless tobacco: Never Used  . Alcohol use Yes     Comment: occ.     Allergies   Rubbing alcohol [alcohol]   Review of Systems Review of Systems  Constitutional: Negative for appetite change and fatigue.  HENT: Negative for congestion, ear discharge and sinus pressure.   Eyes: Negative for discharge.  Respiratory: Negative for cough.   Cardiovascular: Negative for chest pain.  Gastrointestinal: Negative for abdominal pain and diarrhea.  Genitourinary: Negative for frequency and hematuria.  Musculoskeletal: Negative for back pain.  Skin: Negative for rash.  Neurological: Negative for seizures and headaches.  Psychiatric/Behavioral: Negative for hallucinations.     Physical Exam Updated Vital Signs BP 137/60 (BP Location: Left Arm)   Pulse 85   Temp 98.2 F (36.8 C) (Oral)   Resp 18   Ht 5\' 3"  (1.6 m)   Wt 133 lb (60.3 kg)   LMP 03/25/2016   SpO2 100%   BMI 23.56 kg/m    Physical Exam  Constitutional: She is oriented to person, place, and time. She appears well-developed.  HENT:  Head: Normocephalic.  Eyes: Conjunctivae and EOM are normal. No scleral icterus.  Neck: Neck supple. No thyromegaly present.  Cardiovascular: Normal rate and regular rhythm.  Exam reveals no gallop and no friction rub.   No murmur heard. Pulmonary/Chest: No stridor. She has no wheezes. She has no rales. She exhibits no tenderness.  Abdominal: She exhibits no distension. There is no tenderness. There is no rebound.  Musculoskeletal: Normal range of motion. She exhibits no edema.  Lymphadenopathy:    She has no cervical adenopathy.  Neurological: She is oriented to person, place, and time. She exhibits normal muscle tone. Coordination normal.  Skin: No rash noted. No erythema.  Psychiatric: She has a normal mood and affect. Her behavior is normal.     ED Treatments / Results  Labs (all labs ordered are listed, but only abnormal results are displayed) Labs Reviewed - No data to display  EKG  EKG Interpretation None       Radiology No results found.  Procedures Procedures (including critical care time)  Medications Ordered in ED Medications  prochlorperazine (COMPAZINE) injection 10 mg (10 mg Intravenous Given 04/03/16 1807)  diphenhydrAMINE (BENADRYL) injection 25 mg (25 mg Intravenous Given 04/03/16 1806)  ketorolac (TORADOL) 30 MG/ML injection 30 mg (30 mg Intravenous Given 04/03/16 1805)     Initial Impression / Assessment and Plan / ED Course  I have reviewed the triage vital signs and the nursing notes.  Pertinent labs & imaging results that were available during my care of the patient were reviewed by me and considered in my medical decision making (see chart for details).  Clinical Course     Patient with migraine headache that improved with migraine cocktail. She will follow-up with her PCP as needed  Final Clinical Impressions(s) / ED  Diagnoses   Final diagnoses:  Bad headache    New Prescriptions New Prescriptions   TRAMADOL (ULTRAM) 50 MG TABLET    Take 1 tablet (50 mg total) by mouth every 12 (twelve) hours as needed for severe pain.     Milton Ferguson, MD 04/03/16 [phone]

## 2016-04-03 NOTE — Discharge Instructions (Signed)
Follow up with your md if any problemd

## 2016-04-03 NOTE — ED Notes (Signed)
Pt states understanding of care given and follow up instructions.  Pt A/O, ambulated from ED with steady gait.
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
## 2016-06-14 ENCOUNTER — Emergency Department (HOSPITAL_COMMUNITY)
Admission: EM | Admit: 2016-06-14 | Discharge: 2016-06-14 | Discharge status: Against Medical Advice | Payer: No Typology Code available for payment source

## 2016-06-14 NOTE — ED Notes (Signed)
Not in [facility]

## 2016-06-14 NOTE — ED Notes (Signed)
Not in waiting room

## 2016-06-20 ENCOUNTER — Encounter (HOSPITAL_COMMUNITY): Payer: Self-pay | Admitting: *Deleted

## 2016-06-20 ENCOUNTER — Emergency Department (HOSPITAL_COMMUNITY): Payer: Self-pay

## 2016-06-20 ENCOUNTER — Emergency Department (HOSPITAL_COMMUNITY)
Admission: EM | Admit: 2016-06-20 | Discharge: 2016-06-20 | Disposition: A | Discharge status: Home / Self Care | Payer: Self-pay | Attending: Emergency Medicine | Admitting: Emergency Medicine

## 2016-06-20 DIAGNOSIS — R079 Chest pain, unspecified: Secondary | ICD-10-CM | POA: Insufficient documentation

## 2016-06-20 DIAGNOSIS — Z87891 Personal history of nicotine dependence: Secondary | ICD-10-CM | POA: Insufficient documentation

## 2016-06-20 DIAGNOSIS — R05 Cough: Secondary | ICD-10-CM | POA: Insufficient documentation

## 2016-06-20 DIAGNOSIS — R69 Illness, unspecified: Secondary | ICD-10-CM

## 2016-06-20 DIAGNOSIS — Z79899 Other long term (current) drug therapy: Secondary | ICD-10-CM | POA: Insufficient documentation

## 2016-06-20 DIAGNOSIS — J111 Influenza due to unidentified influenza virus with other respiratory manifestations: Secondary | ICD-10-CM

## 2016-06-20 MED ORDER — IBUPROFEN 800 MG PO TABS: 800.0000 mg | ORAL_TABLET | Freq: Three times a day (TID) | ORAL | 0 refills | Status: DC

## 2016-06-20 MED ORDER — IBUPROFEN 800 MG PO TABS
800.0000 mg | ORAL_TABLET | Freq: Once | ORAL | Status: AC
  Administered 2016-06-20: 800 mg via ORAL
  Filled 2016-06-20: qty 1

## 2016-06-20 MED ORDER — OSELTAMIVIR PHOSPHATE 75 MG PO CAPS: 75.0000 mg | ORAL_CAPSULE | Freq: Two times a day (BID) | ORAL | 0 refills | Status: DC

## 2016-06-20 MED ORDER — BENZONATATE 100 MG PO CAPS: 100.0000 mg | ORAL_CAPSULE | Freq: Three times a day (TID) | ORAL | 0 refills | Status: DC

## 2016-06-20 NOTE — ED Provider Notes (Signed)
[facility] DEPT Provider Note   CSN: [id number] Arrival date & time: 06/20/16  0813     History   Chief Complaint Chief Complaint  Patient presents with  . Generalized Body Aches    HPI Marie Brown is a 26 y.o. female.  The history is provided by the patient. No language interpreter was used.  Cough  This is a new problem. The current episode started more than 2 days ago. The problem occurs constantly. The problem has been rapidly worsening. The cough is non-productive. The maximum temperature recorded prior to her arrival was 101 to 101.9 F. Associated symptoms include chest pain. She has tried nothing for the symptoms. The treatment provided no relief. She is not a smoker. Her past medical history does not include asthma.    Past Medical History:  Diagnosis Date  . Bipolar 2 disorder ([place])   . Carpal tunnel syndrome   . Kidney stones   . Migraine     Patient Active Problem List   Diagnosis Date Noted  . Dysmenorrhea 11/09/2012  . ACUTE BRONCHOSPASM 12/12/2009  . NECK PAIN 06/13/2009  . NAUSEA AND VOMITING 05/24/2009  . ACUTE LARYNGITIS, WITHOUT MENTION OF OBSTRUCTIO 05/02/2009  . WEIGHT GAIN 04/22/2009  . HEADACHE 04/22/2009  . UNSPECIFIED VISUAL LOSS 01/17/2009  . ACUTE CYSTITIS 01/17/2009  . Vaginitis and vulvovaginitis, unspecified 01/17/2009  . FATIGUE 01/17/2009  . ACUTE SINUSITIS, UNSPECIFIED 11/08/2008  . ACUTE BRONCHITIS 11/08/2008  . BIPOLAR DISORDER UNSPECIFIED 08/12/2008  . NICOTINE ADDICTION 08/12/2008    Past Surgical History:  Procedure Laterality Date  . APPENDECTOMY    . FOOT SURGERY Bilateral 2005    OB History    Gravida Para Term Preterm AB Living   0 0 0 0 0 0   SAB TAB Ectopic Multiple Live Births   0 0 0 0         Home Medications    Prior to Admission medications   Medication Sig Start Date End Date Taking? Authorizing Provider  acetaminophen (TYLENOL) 500 MG tablet Take 1,000 mg by mouth every 6 (six) hours as  needed.   Yes Historical Provider, MD  diphenhydrAMINE (BENADRYL) 25 MG tablet Take 50 mg by mouth every 6 (six) hours as needed.   Yes Historical Provider, MD  amoxicillin (AMOXIL) 500 MG capsule Take 1 capsule (500 mg total) by mouth 3 (three) times daily. Patient not taking: Reported on 04/03/2016 02/15/16   Fransico Meadow, PA-C  benzonatate (TESSALON) 100 MG capsule Take 1 capsule (100 mg total) by mouth every 8 (eight) hours. Patient not taking: Reported on 04/03/2016 02/15/16   Fransico Meadow, PA-C  traMADol (ULTRAM) 50 MG tablet Take 1 tablet (50 mg total) by mouth every 12 (twelve) hours as needed for severe pain. Patient not taking: Reported on 06/20/2016 04/03/16   Milton Ferguson, MD    Family History Family History  Problem Relation Age of Onset  . Hypertension Mother   . Diabetes Other   . Thyroid disease Other   . Heart disease Other   . Birth defects Other   . Mental illness Other   . Stroke Other     Social History Social History  Substance Use Topics  . Smoking status: Former Smoker    Packs/day: 0.50    Years: 3.00    Types: Cigarettes    Quit date: 04/18/2016  . Smokeless tobacco: Never Used  . Alcohol use Yes     Comment: occ.     Allergies  Rubbing alcohol [alcohol]   Review of Systems Review of Systems  Respiratory: Positive for cough.   Cardiovascular: Positive for chest pain.  All other systems reviewed and are negative.    Physical Exam Updated Vital Signs BP 143/81   Pulse 107   Temp 98.5 F (36.9 C) (Oral)   Resp 18   Ht 5\' 3"  (1.6 m)   Wt 59.4 kg   LMP 06/14/2016   SpO2 97%   BMI 23.21 kg/m   Physical Exam  Constitutional: She appears well-developed and well-nourished. No distress.  HENT:  Head: Normocephalic and atraumatic.  Right Ear: External ear normal.  Left Ear: External ear normal.  Eyes: Conjunctivae are normal.  Neck: Normal range of motion. Neck supple.  Cardiovascular: Normal rate and regular rhythm.   No murmur  heard. Pulmonary/Chest: Effort normal and breath sounds normal. No respiratory distress.  Abdominal: Soft. There is no tenderness.  Musculoskeletal: Normal range of motion. She exhibits no edema.  Neurological: She is alert.  Skin: Skin is warm and dry.  Psychiatric: She has a normal mood and affect.  Nursing note and vitals reviewed.    ED Treatments / Results  Labs (all labs ordered are listed, but only abnormal results are displayed) Labs Reviewed - No data to display  EKG  EKG Interpretation None       Radiology Dg Chest 2 View  Result Date: 06/20/2016 CLINICAL DATA:  26 year old female with history of cough and body aches. Nausea and vomiting. Former smoker. EXAM: CHEST  2 VIEW COMPARISON:  Chest x-ray 08/15/2014. FINDINGS: Lung volumes are normal. No consolidative airspace disease. No pleural effusions. No pneumothorax. No pulmonary nodule or mass noted. Pulmonary vasculature and the cardiomediastinal silhouette are within normal limits. Bilateral nipple rings are incidentally noted. IMPRESSION: No radiographic evidence of acute cardiopulmonary disease. Electronically Signed   By: Vinnie Langton M.D.   On: 06/20/2016 09:35    Procedures Procedures (including critical care time)  Medications Ordered in ED Medications  ibuprofen (ADVIL,MOTRIN) tablet 800 mg (800 mg Oral Given 06/20/16 0859)     Initial Impression / Assessment and Plan / ED Course  I have reviewed the triage vital signs and the nursing notes.  Pertinent labs & imaging results that were available during my care of the patient were reviewed by me and considered in my medical decision making (see chart for details).       Final Clinical Impressions(s) / ED Diagnoses   Final diagnoses:  Influenza-like illness    New Prescriptions New Prescriptions   BENZONATATE (TESSALON) 100 MG CAPSULE    Take 1 capsule (100 mg total) by mouth every 8 (eight) hours.   IBUPROFEN (ADVIL,MOTRIN) 800 MG TABLET     Take 1 tablet (800 mg total) by mouth 3 (three) times daily.   OSELTAMIVIR (TAMIFLU) 75 MG CAPSULE    Take 1 capsule (75 mg total) by mouth every 12 (twelve) hours.  An After Visit Summary was printed and given to the patient.   Hollace Kinnier [place], PA-C 06/20/16 [place], MD 06/20/16 [phone]

## 2016-06-20 NOTE — ED Triage Notes (Addendum)
Pt c/o headache, body aches, chest hurting while coughing, non-productive cough, nausea, vomiting x 2 during coughing episodes that started yesterday. Pt denies sore throat, diarrhea. Pt also desires pregnancy test. Pt reports she started her menses on 06/14/16 but it only lasted 4 days unlike her normal 7 days and expresses concern for pregnancy.

## 2017-05-19 ENCOUNTER — Emergency Department (HOSPITAL_COMMUNITY)
Admission: EM | Admit: 2017-05-19 | Discharge: 2017-05-19 | Disposition: A | Discharge status: Home / Self Care | Payer: BLUE CROSS/BLUE SHIELD | Attending: Emergency Medicine | Admitting: Emergency Medicine

## 2017-05-19 ENCOUNTER — Encounter (HOSPITAL_COMMUNITY): Payer: Self-pay | Admitting: *Deleted

## 2017-05-19 DIAGNOSIS — J0100 Acute maxillary sinusitis, unspecified: Secondary | ICD-10-CM | POA: Insufficient documentation

## 2017-05-19 DIAGNOSIS — F319 Bipolar disorder, unspecified: Secondary | ICD-10-CM | POA: Diagnosis not present

## 2017-05-19 DIAGNOSIS — Z87891 Personal history of nicotine dependence: Secondary | ICD-10-CM | POA: Insufficient documentation

## 2017-05-19 DIAGNOSIS — J3489 Other specified disorders of nose and nasal sinuses: Secondary | ICD-10-CM | POA: Diagnosis present

## 2017-05-19 MED ORDER — PSEUDOEPHEDRINE HCL 60 MG PO TABS: 60.0000 mg | ORAL_TABLET | Freq: Four times a day (QID) | ORAL | 0 refills | Status: DC | PRN

## 2017-05-19 MED ORDER — PREDNISONE 20 MG PO TABS: 40.0000 mg | ORAL_TABLET | Freq: Every day | ORAL | 0 refills | Status: DC

## 2017-05-19 MED ORDER — FLUTICASONE PROPIONATE 50 MCG/ACT NA SUSP: 2.0000 | Freq: Every day | NASAL | 0 refills | Status: DC

## 2017-05-19 NOTE — Discharge Instructions (Signed)
Drink plenty of fluids.  Discontinue all over-the-counter cold and sinus medications.  You can take Tylenol or ibuprofen as directed if needed for fever and/or body aches.  Follow-up with your primary doctor or return here for any worsening symptoms

## 2017-05-19 NOTE — ED Triage Notes (Signed)
Sinus infection for 2 days with headache

## 2017-05-20 NOTE — ED Provider Notes (Signed)
[facility] EMERGENCY DEPARTMENT Provider Note   CSN: [id number] Arrival date & time: 05/19/17  1238     History   Chief Complaint Chief Complaint  Patient presents with  . Sinusitis    HPI Marie Brown is a 27 y.o. female.  HPI  Marie Brown is a 27 y.o. female who presents to the Emergency Department complaining of sinus congestion, pressure, and pain.  Symptoms have been present for 2-3 days.  Her symptoms are associated with a frontal headache that she describes as throbbing and pressure.  She states she has been blowing green to yellow mucus from her nose.  She denies neck pain, fever, visual changes, ear pain and dizziness.  She has not tried any medications for symptomatic relief.   Past Medical History:  Diagnosis Date  . Bipolar 2 disorder ([place])   . Carpal tunnel syndrome   . Kidney stones   . Migraine     Patient Active Problem List   Diagnosis Date Noted  . Dysmenorrhea 11/09/2012  . ACUTE BRONCHOSPASM 12/12/2009  . NECK PAIN 06/13/2009  . NAUSEA AND VOMITING 05/24/2009  . ACUTE LARYNGITIS, WITHOUT MENTION OF OBSTRUCTIO 05/02/2009  . WEIGHT GAIN 04/22/2009  . HEADACHE 04/22/2009  . UNSPECIFIED VISUAL LOSS 01/17/2009  . ACUTE CYSTITIS 01/17/2009  . Vaginitis and vulvovaginitis, unspecified 01/17/2009  . FATIGUE 01/17/2009  . ACUTE SINUSITIS, UNSPECIFIED 11/08/2008  . ACUTE BRONCHITIS 11/08/2008  . BIPOLAR DISORDER UNSPECIFIED 08/12/2008  . NICOTINE ADDICTION 08/12/2008    Past Surgical History:  Procedure Laterality Date  . APPENDECTOMY    . FOOT SURGERY Bilateral 2005    OB History    Gravida Para Term Preterm AB Living   0 0 0 0 0 0   SAB TAB Ectopic Multiple Live Births   0 0 0 0         Home Medications    Prior to Admission medications   Medication Sig Start Date End Date Taking? Authorizing Provider  acetaminophen (TYLENOL) 500 MG tablet Take 1,000 mg by mouth every 6 (six) hours as needed.   Yes [provider]    Phenylephrine-Ibuprofen (ADVIL SINUS CONGESTION & PAIN) 10-200 MG TABS Take 1 tablet by mouth every 6 (six) hours.   Yes [provider]  fluticasone (FLONASE) 50 MCG/ACT nasal spray Place 2 sprays into both nostrils daily. 05/19/17   Lorilei Horan, PA-C  predniSONE (DELTASONE) 20 MG tablet Take 2 tablets (40 mg total) by mouth daily. 05/19/17   Johnika Escareno, PA-C  pseudoephedrine (SUDAFED) 60 MG tablet Take 1 tablet (60 mg total) by mouth every 6 (six) hours as needed for congestion. 05/19/17   Kem Parkinson, PA-C    Family History Family History  Problem Relation Age of Onset  . Hypertension Mother   . Diabetes Other   . Thyroid disease Other   . Heart disease Other   . Birth defects Other   . Mental illness Other   . Stroke Other     Social History Social History   Tobacco Use  . Smoking status: Former Smoker    Packs/day: 0.50    Years: 3.00    Pack years: 1.50    Types: Cigarettes    Last attempt to quit: 04/18/2016    Years since quitting: 1.0  . Smokeless tobacco: Never Used  Substance Use Topics  . Alcohol use: Yes    Comment: occ.  . Drug use: No     Allergies   Patient has no active  allergies.   Review of Systems Review of Systems  Constitutional: Negative for appetite change, chills and fever.  HENT: Positive for congestion, sinus pressure and sinus pain. Negative for ear pain, facial swelling, sore throat and trouble swallowing.   Eyes: Negative for visual disturbance.  Respiratory: Negative for cough, chest tightness, shortness of breath and wheezing.   Cardiovascular: Negative for chest pain.  Gastrointestinal: Negative for abdominal pain, nausea and vomiting.  Genitourinary: Negative for dysuria.  Musculoskeletal: Negative for arthralgias.  Skin: Negative for rash.  Neurological: Positive for headaches. Negative for dizziness, syncope, weakness and numbness.  Hematological: Negative for adenopathy.  All other systems reviewed and are  negative.    Physical Exam Updated Vital Signs BP 127/74   Pulse 68   Temp 98.5 F (36.9 C) (Oral)   Resp 18   Ht 5\' 3"  (1.6 m)   Wt 65.3 kg (144 lb)   LMP 04/28/2017   SpO2 99%   BMI 25.51 kg/m   Physical Exam  Constitutional: She is oriented to person, place, and time. She appears well-developed and well-nourished. No distress.  HENT:  Head: Normocephalic and atraumatic.  Right Ear: Tympanic membrane and ear canal normal.  Left Ear: Tympanic membrane and ear canal normal.  Nose: Mucosal edema and rhinorrhea present. Right sinus exhibits maxillary sinus tenderness. Left sinus exhibits maxillary sinus tenderness.  Mouth/Throat: Uvula is midline and mucous membranes are normal. No trismus in the jaw. No uvula swelling. Posterior oropharyngeal erythema present. No oropharyngeal exudate, posterior oropharyngeal edema or tonsillar abscesses.  Eyes: Conjunctivae are normal.  Neck: Normal range of motion and phonation normal. Neck supple. No Kernig's sign noted.  Cardiovascular: Normal rate, regular rhythm and intact distal pulses.  No murmur heard. Pulmonary/Chest: Effort normal and breath sounds normal. No respiratory distress. She has no wheezes. She has no rales.  Musculoskeletal: She exhibits no edema.  Lymphadenopathy:    She has no cervical adenopathy.  Neurological: She is alert and oriented to person, place, and time. She exhibits normal muscle tone. Coordination normal.  Skin: Skin is warm and dry. Capillary refill takes less than 2 seconds.  Psychiatric: She has a normal mood and affect.  Nursing note and vitals reviewed.    ED Treatments / Results  Labs (all labs ordered are listed, but only abnormal results are displayed) Labs Reviewed - No data to display  EKG  EKG Interpretation None       Radiology No results found.  Procedures Procedures (including critical care time)  Medications Ordered in ED Medications - No data to display   Initial  Impression / Assessment and Plan / ED Course  I have reviewed the triage vital signs and the nursing notes.  Pertinent labs & imaging results that were available during my care of the patient were reviewed by me and considered in my medical decision making (see chart for details).     Patient is well-appearing.  Nontoxic.  No meningeal signs. symptoms are likely related to acute sinusitis.  I do not feel that antibiotics are indicated at this time given the acute nature of her symptoms.  She appears safe for discharge, she agrees to treatment plan with steroid, nasal spray, and Sudafed.  Final Clinical Impressions(s) / ED Diagnoses   Final diagnoses:  Acute non-recurrent maxillary sinusitis    ED Discharge Orders        Ordered    pseudoephedrine (SUDAFED) 60 MG tablet  Every 6 hours PRN     05/19/17 1430  predniSONE (DELTASONE) 20 MG tablet  Daily     05/19/17 1430    fluticasone (FLONASE) 50 MCG/ACT nasal spray  Daily     05/19/17 1430       Kem Parkinson, PA-C 05/20/17 1556    Isla Pence, MD 05/22/17 [phone]

## 2017-06-09 ENCOUNTER — Encounter: Payer: Self-pay | Admitting: Obstetrics and Gynecology

## 2017-06-15 ENCOUNTER — Other Ambulatory Visit: Payer: Self-pay | Admitting: Obstetrics and Gynecology

## 2017-06-17 ENCOUNTER — Other Ambulatory Visit (HOSPITAL_COMMUNITY)
Admission: RE | Admit: 2017-06-17 | Discharge: 2017-06-17 | Disposition: A | Discharge status: Home / Self Care | Payer: BLUE CROSS/BLUE SHIELD | Source: Ambulatory Visit | Attending: Obstetrics and Gynecology | Admitting: Obstetrics and Gynecology

## 2017-06-17 ENCOUNTER — Encounter: Payer: Self-pay | Admitting: Obstetrics and Gynecology

## 2017-06-17 ENCOUNTER — Ambulatory Visit (INDEPENDENT_AMBULATORY_CARE_PROVIDER_SITE_OTHER): Payer: BLUE CROSS/BLUE SHIELD | Admitting: Obstetrics and Gynecology

## 2017-06-17 VITALS — BP 112/68 | HR 83 | Ht 63.0 in | Wt 145.0 lb

## 2017-06-17 DIAGNOSIS — Z01419 Encounter for gynecological examination (general) (routine) without abnormal findings: Secondary | ICD-10-CM | POA: Diagnosis present

## 2017-06-17 NOTE — Patient Instructions (Signed)
Preventive Care 18-39 Years, Female Preventive care refers to lifestyle choices and visits with your health care provider that can promote health and wellness. What does preventive care include?  A yearly physical exam. This is also called an annual well check.  Dental exams once or twice a year.  Routine eye exams. Ask your health care provider how often you should have your eyes checked.  Personal lifestyle choices, including: ? Daily care of your teeth and gums. ? Regular physical activity. ? Eating a healthy diet. ? Avoiding tobacco and drug use. ? Limiting alcohol use. ? Practicing safe sex. ? Taking vitamin and mineral supplements as recommended by your health care provider. What happens during an annual well check? The services and screenings done by your health care provider during your annual well check will depend on your age, overall health, lifestyle risk factors, and family history of disease. Counseling Your health care provider may ask you questions about your:  Alcohol use.  Tobacco use.  Drug use.  Emotional well-being.  Home and relationship well-being.  Sexual activity.  Eating habits.  Work and work Statistician.  Method of birth control.  Menstrual cycle.  Pregnancy history.  Screening You may have the following tests or measurements:  Height, weight, and BMI.  Diabetes screening. This is done by checking your blood sugar (glucose) after you have not eaten for a while (fasting).  Blood pressure.  Lipid and cholesterol levels. These may be checked every 5 years starting at age 38.  Skin check.  Hepatitis C blood test.  Hepatitis B blood test.  Sexually transmitted disease (STD) testing.  BRCA-related cancer screening. This may be done if you have a family history of breast, ovarian, tubal, or peritoneal cancers.  Pelvic exam and Pap test. This may be done every 3 years starting at age 38. Starting at age 30, this may be done  every 5 years if you have a Pap test in combination with an HPV test.  Discuss your test results, treatment options, and if necessary, the need for more tests with your health care provider. Vaccines Your health care provider may recommend certain vaccines, such as:  Influenza vaccine. This is recommended every year.  Tetanus, diphtheria, and acellular pertussis (Tdap, Td) vaccine. You may need a Td booster every 10 years.  Varicella vaccine. You may need this if you have not been vaccinated.  HPV vaccine. If you are 39 or younger, you may need three doses over 6 months.  Measles, mumps, and rubella (MMR) vaccine. You may need at least one dose of MMR. You may also need a second dose.  Pneumococcal 13-valent conjugate (PCV13) vaccine. You may need this if you have certain conditions and were not previously vaccinated.  Pneumococcal polysaccharide (PPSV23) vaccine. You may need one or two doses if you smoke cigarettes or if you have certain conditions.  Meningococcal vaccine. One dose is recommended if you are age 68-21 years and a first-year college student living in a residence hall, or if you have one of several medical conditions. You may also need additional booster doses.  Hepatitis A vaccine. You may need this if you have certain conditions or if you travel or work in places where you may be exposed to hepatitis A.  Hepatitis B vaccine. You may need this if you have certain conditions or if you travel or work in places where you may be exposed to hepatitis B.  Haemophilus influenzae type b (Hib) vaccine. You may need this  if you have certain risk factors.  Talk to your health care provider about which screenings and vaccines you need and how often you need them. This information is not intended to replace advice given to you by your health care provider. Make sure you discuss any questions you have with your health care provider. Document Released: 07/01/2001 Document Revised:  01/23/2016 Document Reviewed: 03/06/2015 Elsevier Interactive Patient Education  2018 Elsevier Inc.  

## 2017-06-17 NOTE — Progress Notes (Signed)
Patient ID: Marie Brown, female   DOB: 10-06-1990, 27 y.o.   MRN: [id number]  Assessment:  1. Annual Gyn Exam\ 2. Hx Ascus pap 2016 Plan:  1. pap smear done, next pap due 2 years  2. return annually or prn Subjective:  Marie Brown is a 27 y.o. female [place] who presents for annual exam. Patient's last menstrual period was 04/28/2017. The patient has no complaints today. She is here today for a routine gyn exam. The patient recently got her nipples pierced and has been taking care of them as advised. She denies fever, chills, or any other symptoms or complaints at this time.   The following portions of the patient's history were reviewed and updated as appropriate: allergies, current medications, past family history, past medical history, past social history, past surgical history and problem list. Past Medical History:  Diagnosis Date  . Bipolar 2 disorder ([place])   . Carpal tunnel syndrome   . Kidney stones   . Migraine     Past Surgical History:  Procedure Laterality Date  . APPENDECTOMY    . FOOT SURGERY Bilateral 2005     Current Outpatient Medications:  .  fluticasone (FLONASE) 50 MCG/ACT nasal spray, Place 2 sprays into both nostrils daily., Disp: 10 g, Rfl: 0  Review of Systems Constitutional: negative Gastrointestinal: negative Genitourinary: negative  Objective:  BP 112/68 (BP Location: Right Arm, Patient Position: Sitting, Cuff Size: Normal)   Pulse 83   Ht 5\' 3"  (1.6 m)   Wt 145 lb (65.8 kg)   LMP 04/28/2017   BMI 25.69 kg/m    BMI: Body mass index is 25.69 kg/m.  General Appearance: Alert, appropriate appearance for age. No acute distress HEENT: Grossly normal Neck / Thyroid:  Cardiovascular: RRR; normal S1, S2, no murmur Lungs: CTA bilaterally Back: No CVAT Breast Exam: Normal breast tissue bilaterally Gastrointestinal: Soft, non-tender, no masses or organomegaly  PELVIC External genitalia - normal Vulva - normal Vagina - good  support Cervix - multiporus  Uterus - anterior, mobile, non tender  Adnexa - negative  Rectovaginal: not indicated Lymphatic Exam: Non-palpable nodes in neck, clavicular, axillary, or inguinal regions Skin: no rash or abnormalities Neurologic: Normal gait and speech, no tremor  Psychiatric: Alert and oriented, appropriate affect.  Urinalysis:Not done  Mallory Shirk. MD Pgr [phone] 3:50 PM  By signing my name below, I, Margit Banda, attest that this documentation has been prepared under the direction and in the presence of Jonnie Kind, MD. Electronically Signed: Margit Banda, Medical Scribe. 06/17/17. 3:38 PM.  I personally performed the services described in this documentation, which was SCRIBED in my presence. The recorded information has been reviewed and considered accurate. It has been edited as necessary during review. Jonnie Kind, MD

## 2017-06-22 LAB — CYTOLOGY - PAP
Chlamydia: NEGATIVE
Diagnosis: NEGATIVE
Neisseria Gonorrhea: NEGATIVE

## 2017-08-04 ENCOUNTER — Ambulatory Visit: Payer: BLUE CROSS/BLUE SHIELD | Admitting: Family Medicine

## 2017-08-05 ENCOUNTER — Encounter: Payer: Self-pay | Admitting: Obstetrics and Gynecology

## 2017-08-06 ENCOUNTER — Encounter: Payer: Self-pay | Admitting: Obstetrics and Gynecology

## 2017-08-07 ENCOUNTER — Other Ambulatory Visit: Payer: Self-pay | Admitting: Obstetrics and Gynecology

## 2017-08-07 MED ORDER — FLUCONAZOLE 150 MG PO TABS: 150.0000 mg | ORAL_TABLET | Freq: Once | ORAL | 1 refills | Status: AC

## 2017-08-07 NOTE — Progress Notes (Signed)
Diflucan Rx escribed to [facility]

## 2017-09-24 ENCOUNTER — Ambulatory Visit (INDEPENDENT_AMBULATORY_CARE_PROVIDER_SITE_OTHER): Payer: Self-pay | Admitting: Adult Health

## 2017-09-24 ENCOUNTER — Other Ambulatory Visit: Payer: Self-pay

## 2017-09-24 ENCOUNTER — Encounter: Payer: Self-pay | Admitting: Adult Health

## 2017-09-24 VITALS — BP 122/74 | HR 96 | Ht 63.0 in | Wt 138.0 lb

## 2017-09-24 DIAGNOSIS — Z3202 Encounter for pregnancy test, result negative: Secondary | ICD-10-CM

## 2017-09-24 DIAGNOSIS — N926 Irregular menstruation, unspecified: Secondary | ICD-10-CM

## 2017-09-24 DIAGNOSIS — N921 Excessive and frequent menstruation with irregular cycle: Secondary | ICD-10-CM

## 2017-09-24 LAB — POCT URINE PREGNANCY: Preg Test, Ur: NEGATIVE

## 2017-09-24 NOTE — Progress Notes (Signed)
  Subjective:     Patient ID: Marie Brown, female   DOB: 07/21/1990, 27 y.o.   MRN: [id number]  HPI Marie Brown is a 27 year old black female in complaining of spotting on and off since last period, had period 4/18-4/24 and spotted 4/28,29,30 and 5/1 then again 5/8 and 5/9. No pain.  Review of Systems Spotting on and off since last period  Reviewed past medical,surgical, social and family history. Reviewed medications and allergies.     Objective:   Physical Exam BP 122/74 (BP Location: Right Arm, Patient Position: Sitting, Cuff Size: Normal)   Pulse 96   Ht 5\' 3"  (1.6 m)   Wt 138 lb (62.6 kg)   LMP 09/03/2017   BMI 24.45 kg/m UPT negative.  Skin warm and dry.Pelvic: external genitalia is normal in appearance no lesions, vagina: tan discharge without odor,urethra has no lesions or masses noted, cervix:smooth,and non tender, uterus: normal size, shape and contour, non tender, no masses felt, adnexa: no masses or tenderness noted. Bladder is non tender and no masses felt. GC/CHL obtained.     Assessment:     1. Irregular intermenstrual bleeding       Plan:    Use condoms GC/CHL sent F/U prn

## 2017-09-26 LAB — GC/CHLAMYDIA PROBE AMP
Chlamydia trachomatis, NAA: NEGATIVE
Neisseria gonorrhoeae by PCR: NEGATIVE

## 2018-04-26 ENCOUNTER — Ambulatory Visit: Payer: Self-pay | Admitting: Obstetrics and Gynecology

## 2019-03-04 ENCOUNTER — Emergency Department (HOSPITAL_COMMUNITY)
Admission: EM | Admit: 2019-03-04 | Discharge: 2019-03-04 | Disposition: A | Discharge status: Home / Self Care | Payer: BLUE CROSS/BLUE SHIELD

## 2019-03-04 ENCOUNTER — Other Ambulatory Visit: Payer: Self-pay

## 2019-03-31 ENCOUNTER — Encounter: Payer: Self-pay | Admitting: *Deleted

## 2019-04-01 ENCOUNTER — Other Ambulatory Visit (HOSPITAL_COMMUNITY)
Admission: RE | Admit: 2019-04-01 | Discharge: 2019-04-01 | Disposition: A | Discharge status: Home / Self Care | Payer: PRIVATE HEALTH INSURANCE | Source: Ambulatory Visit | Attending: Obstetrics & Gynecology | Admitting: Obstetrics & Gynecology

## 2019-04-01 ENCOUNTER — Other Ambulatory Visit: Payer: Self-pay

## 2019-04-01 ENCOUNTER — Other Ambulatory Visit (INDEPENDENT_AMBULATORY_CARE_PROVIDER_SITE_OTHER): Payer: No Typology Code available for payment source | Admitting: *Deleted

## 2019-04-01 DIAGNOSIS — N898 Other specified noninflammatory disorders of vagina: Secondary | ICD-10-CM

## 2019-04-01 NOTE — Progress Notes (Addendum)
   NURSE VISIT- VAGINITIS/STD/POC  SUBJECTIVE:  Marie Brown is a 28 y.o. G0P0000 GYN patientfemale here for a vaginal swab for vaginitis screening.  She reports the following symptoms: discharge described as curd-like, local irritation and vulvar itching for 3 days. Denies abnormal vaginal bleeding, significant pelvic pain, fever, or UTI symptoms.  OBJECTIVE:  There were no vitals taken for this visit.  Appears well, in no apparent distress  ASSESSMENT: Vaginal swab for vaginitis screening  PLAN: Self-collected vaginal probe for Gonorrhea, Chlamydia, Trichomonas, Bacterial Vaginosis, Yeast sent to lab Treatment: to be determined once results are received Follow-up as needed if symptoms persist/worsen, or new symptoms develop  Marie Brown, Marie Brown  04/01/2019 11:41 AM   Attestation of Attending Supervision of Nursing Visit Encounter: Evaluation and management procedures were performed by the nursing staff under my supervision and collaboration.  I have reviewed the nurse's note and chart, and I agree with the management and plan.  Jacelyn Grip MD Attending Physician for the Center for [facility] 04/01/2019 11:49 AM

## 2019-04-05 LAB — CERVICOVAGINAL ANCILLARY ONLY
Bacterial Vaginitis (gardnerella): POSITIVE — AB
Candida Glabrata: NEGATIVE
Candida Vaginitis: POSITIVE — AB
Chlamydia: NEGATIVE
Comment: NEGATIVE
Comment: NEGATIVE
Comment: NEGATIVE
Comment: NEGATIVE
Comment: NEGATIVE
Comment: NORMAL
Neisseria Gonorrhea: NEGATIVE
Trichomonas: NEGATIVE

## 2019-04-06 ENCOUNTER — Telehealth: Payer: Self-pay | Admitting: Obstetrics & Gynecology

## 2019-04-06 MED ORDER — TERCONAZOLE 0.4 % VA CREA: 1.0000 | TOPICAL_CREAM | Freq: Every day | VAGINAL | 0 refills | Status: DC

## 2019-05-17 ENCOUNTER — Ambulatory Visit: Payer: No Typology Code available for payment source | Attending: Internal Medicine

## 2019-05-17 ENCOUNTER — Other Ambulatory Visit: Payer: Self-pay

## 2019-05-17 DIAGNOSIS — Z20822 Contact with and (suspected) exposure to covid-19: Secondary | ICD-10-CM

## 2019-05-19 ENCOUNTER — Encounter: Payer: Self-pay | Admitting: *Deleted

## 2019-05-19 LAB — NOVEL CORONAVIRUS, NAA: SARS-CoV-2, NAA: DETECTED — AB

## 2019-05-24 ENCOUNTER — Emergency Department (HOSPITAL_COMMUNITY)
Admission: EM | Admit: 2019-05-24 | Discharge: 2019-05-24 | Disposition: A | Discharge status: Home / Self Care | Payer: No Typology Code available for payment source | Attending: Emergency Medicine | Admitting: Emergency Medicine

## 2019-05-24 ENCOUNTER — Encounter (HOSPITAL_COMMUNITY): Payer: Self-pay | Admitting: Emergency Medicine

## 2019-05-24 ENCOUNTER — Other Ambulatory Visit: Payer: Self-pay

## 2019-05-24 DIAGNOSIS — U071 COVID-19: Secondary | ICD-10-CM | POA: Diagnosis not present

## 2019-05-24 DIAGNOSIS — R519 Headache, unspecified: Secondary | ICD-10-CM | POA: Insufficient documentation

## 2019-05-24 DIAGNOSIS — F17210 Nicotine dependence, cigarettes, uncomplicated: Secondary | ICD-10-CM | POA: Diagnosis not present

## 2019-05-24 MED ORDER — DIPHENHYDRAMINE HCL 50 MG/ML IJ SOLN
25.0000 mg | Freq: Once | INTRAMUSCULAR | Status: AC
  Administered 2019-05-24: 11:00:00 25 mg via INTRAVENOUS
  Filled 2019-05-24: qty 1

## 2019-05-24 MED ORDER — METOCLOPRAMIDE HCL 5 MG/ML IJ SOLN
10.0000 mg | Freq: Once | INTRAMUSCULAR | Status: AC
  Administered 2019-05-24: 10 mg via INTRAVENOUS
  Filled 2019-05-24: qty 2

## 2019-05-24 MED ORDER — LACTATED RINGERS IV BOLUS
1000.0000 mL | Freq: Once | INTRAVENOUS | Status: AC
  Administered 2019-05-24: 1000 mL via INTRAVENOUS

## 2019-05-24 MED ORDER — KETOROLAC TROMETHAMINE 30 MG/ML IJ SOLN
15.0000 mg | Freq: Once | INTRAMUSCULAR | Status: AC
  Administered 2019-05-24: 15 mg via INTRAVENOUS
  Filled 2019-05-24: qty 1

## 2019-05-24 NOTE — ED Triage Notes (Signed)
PT c/o headache that started this morning with history of migraines. PT had covid positive test on 05/17/2019. PT states nausea and vomiting that started with her headache this am.

## 2019-05-24 NOTE — ED Notes (Signed)
Pt c/o headache.  Vomited several times during stay.  C/o Migraine, sensitive to light.

## 2019-05-24 NOTE — ED Notes (Signed)
Pt was given a bed side commode.

## 2019-06-01 NOTE — ED Provider Notes (Signed)
[facility] EMERGENCY DEPARTMENT Provider Note   CSN: [id number] Arrival date & time: 05/24/19  [id number]     History Chief Complaint  Patient presents with  . Headache    Marie Brown is a 29 y.o. female.  HPI   28yF with headache. Hx of what she calls migraines. This one began this morning. Gradual onset. Not exertional. No trauma. N/v. No fever. Reports recent COVID positive. Photophobia otherwise no eye complaints. No neck pain or stiffness.   Past Medical History:  Diagnosis Date  . Bipolar 2 disorder ([place])   . Carpal tunnel syndrome   . Kidney stones   . Migraine     Patient Active Problem List   Diagnosis Date Noted  . Dysmenorrhea 11/09/2012  . ACUTE BRONCHOSPASM 12/12/2009  . NECK PAIN 06/13/2009  . NAUSEA AND VOMITING 05/24/2009  . ACUTE LARYNGITIS, WITHOUT MENTION OF OBSTRUCTIO 05/02/2009  . WEIGHT GAIN 04/22/2009  . HEADACHE 04/22/2009  . UNSPECIFIED VISUAL LOSS 01/17/2009  . ACUTE CYSTITIS 01/17/2009  . Vaginitis and vulvovaginitis, unspecified 01/17/2009  . FATIGUE 01/17/2009  . ACUTE SINUSITIS, UNSPECIFIED 11/08/2008  . ACUTE BRONCHITIS 11/08/2008  . BIPOLAR DISORDER UNSPECIFIED 08/12/2008  . NICOTINE ADDICTION 08/12/2008    Past Surgical History:  Procedure Laterality Date  . APPENDECTOMY    . FOOT SURGERY Bilateral 2005     OB History    Gravida  0   Para  0   Term  0   Preterm  0   AB  0   Living  0     SAB  0   TAB  0   Ectopic  0   Multiple  0   Live Births              Family History  Problem Relation Age of Onset  . Hypertension Mother   . Diabetes Other   . Thyroid disease Other   . Heart disease Other   . Birth defects Other   . Mental illness Other   . Stroke Other   . Breast cancer Other   . Graves' disease Maternal Grandmother   . Multiple sclerosis Maternal Grandfather   . Suicidality Father     Social History   Tobacco Use  . Smoking status: Current Every Day Smoker    Packs/day: 0.50   Years: 3.00    Pack years: 1.50    Types: Cigarettes    Last attempt to quit: 04/18/2016    Years since quitting: 3.1  . Smokeless tobacco: Never Used  Substance Use Topics  . Alcohol use: Yes    Comment: occ.  . Drug use: No    Home Medications Prior to Admission medications   Medication Sig Start Date End Date Taking? Authorizing Provider  fluticasone (FLONASE) 50 MCG/ACT nasal spray Place 2 sprays into both nostrils daily. 05/19/17   Triplett, Tammy, PA-C  terconazole (TERAZOL 7) 0.4 % vaginal cream Place 1 applicator vaginally at bedtime. 04/06/19   Florian Buff, MD    Allergies    Patient has no known allergies.  Review of Systems   Review of Systems All systems reviewed and negative, other than as noted in HPI.  Physical Exam Updated Vital Signs BP (!) 146/79   Pulse 87   Temp 98 F (36.7 C) (Oral)   Resp 18   Ht 5\' 3"  (1.6 m)   Wt 69.4 kg   LMP 05/13/2019   SpO2 94%   BMI 27.10 kg/m   Physical Exam  Vitals and nursing note reviewed.  Constitutional:      General: She is not in acute distress.    Appearance: She is well-developed.  HENT:     Head: Normocephalic and atraumatic.  Eyes:     General:        Right eye: No discharge.        Left eye: No discharge.     Conjunctiva/sclera: Conjunctivae normal.  Cardiovascular:     Rate and Rhythm: Normal rate and regular rhythm.     Heart sounds: Normal heart sounds. No murmur. No friction rub. No gallop.   Pulmonary:     Effort: Pulmonary effort is normal. No respiratory distress.     Breath sounds: Normal breath sounds.  Abdominal:     General: There is no distension.     Palpations: Abdomen is soft.     Tenderness: There is no abdominal tenderness.  Musculoskeletal:        General: No tenderness.     Cervical back: Neck supple. No rigidity.  Skin:    General: Skin is warm and dry.  Neurological:     Mental Status: She is alert and oriented to person, place, and time.     Cranial Nerves: No cranial  nerve deficit.     Sensory: No sensory deficit.     Motor: No weakness.     Coordination: Coordination normal.  Psychiatric:        Behavior: Behavior normal.        Thought Content: Thought content normal.     ED Results / Procedures / Treatments   Labs (all labs ordered are listed, but only abnormal results are displayed) Labs Reviewed - No data to display  EKG None  Radiology No results found.  Procedures Procedures (including critical care time)  Medications Ordered in ED Medications  lactated ringers bolus 1,000 mL (0 mLs Intravenous Stopped 05/24/19 1200)  ketorolac (TORADOL) 30 MG/ML injection 15 mg (15 mg Intravenous Given 05/24/19 1036)  metoCLOPramide (REGLAN) injection 10 mg (10 mg Intravenous Given 05/24/19 1033)  diphenhydrAMINE (BENADRYL) injection 25 mg (25 mg Intravenous Given 05/24/19 1040)    ED Course  I have reviewed the triage vital signs and the nursing notes.  Pertinent labs & imaging results that were available during my care of the patient were reviewed by me and considered in my medical decision making (see chart for details).    MDM Rules/Calculators/A&P                       28yF with headache.. Suspect primary HA. Consider emergent secondary causes such as bleed, infectious or mass but doubt. There is no history of trauma. Pt has a nonfocal neurological exam. Afebrile and neck supple. No use of blood thinning medication. Consider ocular etiology such as acute angle closure glaucoma but doubt. Pt denies acute change in visual acuity and eye exam unremarkable. Doubt temporal arteritis given age, no temporal tenderness and temporal artery pulsations palpable. Doubt CO poisoning. No contacts with similar symptoms. Doubt venous thrombosis. Doubt carotid or vertebral arteries dissection. Symptoms improved with meds. Feel that can be safely discharged, but strict return precautions discussed. Outpt fu.    Marie Brown was evaluated in Emergency  Department on 06/01/2019 for the symptoms described in the history of present illness. She was evaluated in the context of the global COVID-19 pandemic, which necessitated consideration that the patient might be at risk for infection with the SARS-CoV-2 virus that  causes COVID-19. Institutional protocols and algorithms that pertain to the evaluation of patients at risk for COVID-19 are in a state of rapid change based on information released by regulatory bodies including the CDC and federal and state organizations. These policies and algorithms were followed during the patient's care in the ED.  Final Clinical Impression(s) / ED Diagnoses Final diagnoses:  Nonintractable headache, unspecified chronicity pattern, unspecified headache type  COVID-19 virus infection    Rx / DC Orders ED Discharge Orders    None       Virgel Manifold, MD 06/01/19 [phone]

## 2019-08-11 ENCOUNTER — Ambulatory Visit: Payer: No Typology Code available for payment source | Admitting: Adult Health

## 2019-08-26 ENCOUNTER — Emergency Department (HOSPITAL_COMMUNITY)
Admission: EM | Admit: 2019-08-26 | Discharge: 2019-08-26 | Disposition: A | Discharge status: Home / Self Care | Payer: Self-pay | Attending: Emergency Medicine | Admitting: Emergency Medicine

## 2019-08-26 ENCOUNTER — Encounter (HOSPITAL_COMMUNITY): Payer: Self-pay | Admitting: Emergency Medicine

## 2019-08-26 ENCOUNTER — Other Ambulatory Visit: Payer: Self-pay

## 2019-08-26 ENCOUNTER — Emergency Department (HOSPITAL_COMMUNITY): Payer: Self-pay

## 2019-08-26 DIAGNOSIS — R112 Nausea with vomiting, unspecified: Secondary | ICD-10-CM

## 2019-08-26 DIAGNOSIS — N201 Calculus of ureter: Secondary | ICD-10-CM

## 2019-08-26 DIAGNOSIS — F17210 Nicotine dependence, cigarettes, uncomplicated: Secondary | ICD-10-CM | POA: Insufficient documentation

## 2019-08-26 DIAGNOSIS — Z79899 Other long term (current) drug therapy: Secondary | ICD-10-CM | POA: Insufficient documentation

## 2019-08-26 DIAGNOSIS — R109 Unspecified abdominal pain: Secondary | ICD-10-CM

## 2019-08-26 LAB — BASIC METABOLIC PANEL WITH GFR
Anion gap: 11 (ref 5–15)
BUN: 10 mg/dL (ref 6–20)
CO2: 22 mmol/L (ref 22–32)
Calcium: 9.1 mg/dL (ref 8.9–10.3)
Chloride: 101 mmol/L (ref 98–111)
Creatinine, Ser: 0.67 mg/dL (ref 0.44–1.00)
GFR calc Af Amer: >60 mL/min
GFR calc non Af Amer: >60 mL/min
Glucose, Bld: 112 mg/dL — ABNORMAL HIGH (ref 70–99)
Potassium: 3.4 mmol/L — ABNORMAL LOW (ref 3.5–5.1)
Sodium: 134 mmol/L — ABNORMAL LOW (ref 135–145)

## 2019-08-26 LAB — URINALYSIS, ROUTINE W REFLEX MICROSCOPIC
Bacteria, UA: NONE SEEN
Bilirubin Urine: NEGATIVE
Glucose, UA: NEGATIVE mg/dL
Ketones, ur: NEGATIVE mg/dL
Leukocytes,Ua: NEGATIVE
Nitrite: NEGATIVE
Protein, ur: NEGATIVE mg/dL
RBC / HPF: >50 RBC/hpf — ABNORMAL HIGH (ref 0–5)
Specific Gravity, Urine: 1.01 (ref 1.005–1.030)
pH: 9 — ABNORMAL HIGH (ref 5.0–8.0)

## 2019-08-26 LAB — HCG, QUANTITATIVE, PREGNANCY: hCG, Beta Chain, Quant, S: <1 m[IU]/mL (ref <5)

## 2019-08-26 LAB — CBC WITH DIFFERENTIAL/PLATELET
Abs Immature Granulocytes: 0.02 10*3/uL (ref 0.00–0.07)
Basophils Absolute: 0 10*3/uL (ref 0.0–0.1)
Basophils Relative: 0 %
Eosinophils Absolute: 0 10*3/uL (ref 0.0–0.5)
Eosinophils Relative: 0 %
HCT: 40.6 % (ref 36.0–46.0)
Hemoglobin: 13.2 g/dL (ref 12.0–15.0)
Immature Granulocytes: 0 %
Lymphocytes Relative: 11 %
Lymphs Abs: 0.9 10*3/uL (ref 0.7–4.0)
MCH: 31.5 pg (ref 26.0–34.0)
MCHC: 32.5 g/dL (ref 30.0–36.0)
MCV: 96.9 fL (ref 80.0–100.0)
Monocytes Absolute: 0.3 10*3/uL (ref 0.1–1.0)
Monocytes Relative: 3 %
Neutro Abs: 7.5 10*3/uL (ref 1.7–7.7)
Neutrophils Relative %: 86 %
Platelets: 325 10*3/uL (ref 150–400)
RBC: 4.19 MIL/uL (ref 3.87–5.11)
RDW: 12.9 % (ref 11.5–15.5)
WBC: 8.8 10*3/uL (ref 4.0–10.5)
nRBC: 0 % (ref 0.0–0.2)

## 2019-08-26 MED ORDER — IBUPROFEN 800 MG PO TABS: 800.0000 mg | ORAL_TABLET | Freq: Three times a day (TID) | ORAL | 0 refills | Status: DC

## 2019-08-26 MED ORDER — ONDANSETRON HCL 4 MG/2ML IJ SOLN
4.0000 mg | Freq: Once | INTRAMUSCULAR | Status: AC
  Administered 2019-08-26: 4 mg via INTRAVENOUS
  Filled 2019-08-26: qty 2

## 2019-08-26 MED ORDER — ONDANSETRON 4 MG PO TBDP: ORAL_TABLET | ORAL | 0 refills | Status: DC

## 2019-08-26 MED ORDER — HYDROMORPHONE HCL 1 MG/ML IJ SOLN
1.0000 mg | Freq: Once | INTRAMUSCULAR | Status: AC
  Administered 2019-08-26: 1 mg via INTRAVENOUS
  Filled 2019-08-26: qty 1

## 2019-08-26 MED ORDER — TAMSULOSIN HCL 0.4 MG PO CAPS: 0.4000 mg | ORAL_CAPSULE | Freq: Every day | ORAL | 0 refills | Status: DC

## 2019-08-26 MED ORDER — OXYCODONE-ACETAMINOPHEN 5-325 MG PO TABS: 1.0000 | ORAL_TABLET | Freq: Four times a day (QID) | ORAL | 0 refills | Status: DC | PRN

## 2019-08-26 MED ORDER — ONDANSETRON HCL 4 MG/2ML IJ SOLN: 4.0000 mg | Freq: Once | INTRAMUSCULAR | Status: DC

## 2019-08-26 MED ORDER — SODIUM CHLORIDE 0.9 % IV BOLUS
1000.0000 mL | Freq: Once | INTRAVENOUS | Status: AC
  Administered 2019-08-26: 1000 mL via INTRAVENOUS

## 2019-08-26 NOTE — Discharge Instructions (Signed)
You were seen in the emergency department and found to have a kidney stone.  We are sending you home with multiple medications to assist with passing the stone:   -Flomax-this is a medication to help pass the stone, it allows urine to exit the body more freely.  Please take this once daily with a meal.  -Ibuprofen 800 mg-this is a medication that will help with pain as well as passing the stone.  Please take this every 8 hours.  Take this with food as it can cause stomach upset and at worst stomach bleeding.  Do not take other NSAIDs such as Motrin, Aleve, Advil, Mobic, or Naproxen with this medicine as they are similar and would propagate any potential side effects.   -Percocet-this is a narcotic/controlled substance medication that has potential addicting qualities.  We recommend that you take 1-2 tablets every 6 hours as needed for severe pain.  Do not drive or operate heavy machinery when taking this medicine as it can be sedating. Do not drink alcohol or take other sedating medications when taking this medicine for safety reasons.  Keep this out of reach of small children.  Please be aware this medicine has Tylenol in it (325 mg/tab) do not exceed the maximum dose of Tylenol in a day per over the counter recommendations should you decide to supplement with Tylenol over the counter.   -Zofran-this is an antinausea medication, you may take this every 8 hours as needed for nausea and vomiting, please allow the tablet to dissolve underneath of your tongue.   We have prescribed you new medication(s) today. Discuss the medications prescribed today with your pharmacist as they can have adverse effects and interactions with your other medicines including over the counter and prescribed medications. Seek medical evaluation if you start to experience new or abnormal symptoms after taking one of these medicines, seek care immediately if you start to experience difficulty breathing, feeling of your throat  closing, facial swelling, or rash as these could be indications of a more serious allergic reaction  Please follow-up with the urology group provided in your discharge instructions within 3 to 5 days.  Return to the ER for new or worsening symptoms including but not limited to worsening pain not controlled by these medicines, inability to keep fluids down, fever, or any other concerns that you may have.

## 2019-08-26 NOTE — ED Notes (Signed)
To CT

## 2019-08-26 NOTE — ED Provider Notes (Signed)
[facility] EMERGENCY DEPARTMENT Provider Note   CSN: [id number] Arrival date & time: 08/26/19  1326     History Chief Complaint  Patient presents with  . Flank Pain  . Emesis    Marie Brown is a 29 y.o. female.  Marie Brown is a 29 y.o. female with a history of kidney stones, migraines, bipolar 2 disorder, who presents to the ED for evaluation of left-sided flank pain and vomiting that began this morning.  She reports since she woke up she has vomited numerous times, denies hematemesis.  Reports sharp stabbing pain in the left flank that radiates into the groin.  No associated fevers or chills.  No diarrhea or constipation.  States this feels similar to previous kidney stones, she has had several in the past but it has been a few years since she has had one.  She denies dysuria or urinary frequency.  She is unsure if she seen any blood in her urine because she is currently on her menstrual cycle.  She took some Phenergan prior to arrival which usually helps with her vomiting but did not help at all today.  No other aggravating or alleviating factors.  She has never seen a urologist.        Past Medical History:  Diagnosis Date  . Bipolar 2 disorder ([place])   . Carpal tunnel syndrome   . Kidney stones   . Migraine     Patient Active Problem List   Diagnosis Date Noted  . Dysmenorrhea 11/09/2012  . ACUTE BRONCHOSPASM 12/12/2009  . NECK PAIN 06/13/2009  . NAUSEA AND VOMITING 05/24/2009  . ACUTE LARYNGITIS, WITHOUT MENTION OF OBSTRUCTIO 05/02/2009  . WEIGHT GAIN 04/22/2009  . HEADACHE 04/22/2009  . UNSPECIFIED VISUAL LOSS 01/17/2009  . ACUTE CYSTITIS 01/17/2009  . Vaginitis and vulvovaginitis, unspecified 01/17/2009  . FATIGUE 01/17/2009  . ACUTE SINUSITIS, UNSPECIFIED 11/08/2008  . ACUTE BRONCHITIS 11/08/2008  . BIPOLAR DISORDER UNSPECIFIED 08/12/2008  . NICOTINE ADDICTION 08/12/2008    Past Surgical History:  Procedure Laterality Date  . APPENDECTOMY    .  FOOT SURGERY Bilateral 2005     OB History    Gravida  0   Para  0   Term  0   Preterm  0   AB  0   Living  0     SAB  0   TAB  0   Ectopic  0   Multiple  0   Live Births              Family History  Problem Relation Age of Onset  . Hypertension Mother   . Diabetes Other   . Thyroid disease Other   . Heart disease Other   . Birth defects Other   . Mental illness Other   . Stroke Other   . Breast cancer Other   . Graves' disease Maternal Grandmother   . Multiple sclerosis Maternal Grandfather   . Suicidality Father     Social History   Tobacco Use  . Smoking status: Current Every Day Smoker    Packs/day: 0.50    Years: 3.00    Pack years: 1.50    Types: Cigarettes    Last attempt to quit: 04/18/2016    Years since quitting: 3.3  . Smokeless tobacco: Never Used  Substance Use Topics  . Alcohol use: Yes    Comment: occ.  . Drug use: No    Home Medications Prior to Admission medications   Medication  Sig Start Date End Date Taking? Authorizing Provider  fluticasone (FLONASE) 50 MCG/ACT nasal spray Place 2 sprays into both nostrils daily. 05/19/17   Triplett, Tammy, PA-C  ibuprofen (ADVIL) 800 MG tablet Take 1 tablet (800 mg total) by mouth 3 (three) times daily. 08/26/19   Jacqlyn Larsen, PA-C  ondansetron (ZOFRAN ODT) 4 MG disintegrating tablet 4mg  ODT q4 hours prn nausea/vomit 08/26/19   Jacqlyn Larsen, PA-C  oxyCODONE-acetaminophen (PERCOCET) 5-325 MG tablet Take 1-2 tablets by mouth every 6 (six) hours as needed. 08/26/19   Jacqlyn Larsen, PA-C  tamsulosin (FLOMAX) 0.4 MG CAPS capsule Take 1 capsule (0.4 mg total) by mouth daily. 08/26/19   Jacqlyn Larsen, PA-C  terconazole (TERAZOL 7) 0.4 % vaginal cream Place 1 applicator vaginally at bedtime. 04/06/19   Florian Buff, MD    Allergies    Patient has no known allergies.  Review of Systems   Review of Systems  Constitutional: Negative for chills and fever.  HENT: Negative.   Respiratory: Negative  for cough and shortness of breath.   Cardiovascular: Negative for chest pain.  Gastrointestinal: Positive for abdominal pain, nausea and vomiting. Negative for constipation and diarrhea.  Genitourinary: Positive for flank pain and vaginal bleeding (On menstrual cycle). Negative for dysuria, frequency and hematuria.  Musculoskeletal: Positive for back pain.  Skin: Negative for color change and rash.  Neurological: Negative for dizziness, syncope and light-headedness.    Physical Exam Updated Vital Signs BP 130/75   Pulse 70   Temp 98.1 F (36.7 C) (Oral)   Resp 19   Ht 5\' 3"  (1.6 m)   Wt 65.8 kg   LMP 08/25/2019   SpO2 100%   BMI 25.69 kg/m   Physical Exam Vitals and nursing note reviewed.  Constitutional:      General: She is not in acute distress.    Appearance: Normal appearance. She is well-developed. She is not diaphoretic.     Comments: Patient appears uncomfortable but is in no acute distress  HENT:     Head: Normocephalic and atraumatic.     Mouth/Throat:     Mouth: Mucous membranes are moist.     Pharynx: Oropharynx is clear.  Eyes:     General:        Right eye: No discharge.        Left eye: No discharge.     Pupils: Pupils are equal, round, and reactive to light.  Cardiovascular:     Rate and Rhythm: Normal rate and regular rhythm.     Heart sounds: Normal heart sounds. No murmur. No friction rub. No gallop.   Pulmonary:     Effort: Pulmonary effort is normal. No respiratory distress.     Breath sounds: Normal breath sounds. No wheezing or rales.     Comments: Respirations equal and unlabored, patient able to speak in full sentences, lungs clear to auscultation bilaterally Abdominal:     General: Bowel sounds are normal. There is no distension.     Palpations: Abdomen is soft. There is no mass.     Tenderness: There is abdominal tenderness. There is left CVA tenderness. There is no guarding.     Comments: Abdomen is soft, nondistended, bowel sounds present  throughout, there is mild tenderness in the left lower quadrant as well as left-sided CVA tenderness, no guarding or rigidity  Musculoskeletal:        General: No deformity.     Cervical back: Neck supple.  Skin:  General: Skin is warm and dry.     Capillary Refill: Capillary refill takes less than 2 seconds.  Neurological:     Mental Status: She is alert and oriented to person, place, and time.     Coordination: Coordination normal.     Comments: Speech is clear, able to follow commands Moves extremities without ataxia, coordination intact  Psychiatric:        Mood and Affect: Mood normal.        Behavior: Behavior normal.     ED Results / Procedures / Treatments   Labs (all labs ordered are listed, but only abnormal results are displayed) Labs Reviewed  URINALYSIS, ROUTINE W REFLEX MICROSCOPIC - Abnormal; Notable for the following components:      Result Value   APPearance HAZY (*)    pH 9.0 (*)    Hgb urine dipstick LARGE (*)    RBC / HPF >50 (*)    All other components within normal limits  BASIC METABOLIC PANEL - Abnormal; Notable for the following components:   Sodium 134 (*)    Potassium 3.4 (*)    Glucose, Bld 112 (*)    All other components within normal limits  CBC WITH DIFFERENTIAL/PLATELET  HCG, QUANTITATIVE, PREGNANCY    EKG None  Radiology CT Renal Stone Study  Result Date: 08/26/2019 CLINICAL DATA:  Left flank pain with vomiting. History of kidney stones and appendectomy. EXAM: CT ABDOMEN AND PELVIS WITHOUT CONTRAST TECHNIQUE: Multidetector CT imaging of the abdomen and pelvis was performed following the standard protocol without IV contrast. COMPARISON:  Abdominopelvic CT 03/15/2015. FINDINGS: Lower chest: Stable linear scarring at both lung bases. No significant pleural or pericardial effusion. Hepatobiliary: The liver appears stable without focal abnormality on noncontrast imaging. No evidence of gallstones, gallbladder wall thickening or biliary  dilatation. Pancreas: Unremarkable. No pancreatic ductal dilatation or surrounding inflammatory changes. Spleen: Normal in size without focal abnormality. Adrenals/Urinary Tract: Both adrenal glands appear normal. There are small bilateral renal calculi. In the mid left ureter, there is a partially obstructing 3 mm calculus on image 42/2. This overlaps the left L4 transverse process and is not well visualized on the scout image. Mild left-sided hydronephrosis and perinephric soft tissue stranding. No right ureteral calculus. The bladder appears unremarkable. Stomach/Bowel: No evidence of bowel wall thickening, distention or surrounding inflammatory change. Moderate stool throughout the colon. Postsurgical changes consistent with previous appendectomy. Vascular/Lymphatic: There are no enlarged abdominal or pelvic lymph nodes. No significant vascular findings on noncontrast imaging. Reproductive: Stable uterine deviation to the left. No evidence of adnexal mass. Vaginal tampon in place. Other: No evidence of abdominal wall mass or hernia. No ascites. Musculoskeletal: No acute or significant osseous findings. IMPRESSION: 1. Partially obstructing 3 mm calculus in the mid left ureter. 2. Small bilateral renal calculi. Electronically Signed   By: Richardean Sale M.D.   On: 08/26/2019 17:15    Procedures Procedures (including critical care time)  Medications Ordered in ED Medications  ondansetron (ZOFRAN) injection 4 mg (0 mg Intravenous Hold 08/26/19 1514)  ondansetron (ZOFRAN) injection 4 mg (4 mg Intravenous Given 08/26/19 1430)  sodium chloride 0.9 % bolus 1,000 mL (0 mLs Intravenous Stopped 08/26/19 1636)  HYDROmorphone (DILAUDID) injection 1 mg (1 mg Intravenous Given 08/26/19 1443)    ED Course  I have reviewed the triage vital signs and the nursing notes.  Pertinent labs & imaging results that were available during my care of the patient were reviewed by me and considered in my medical  decision making (see  chart for details).    MDM Rules/Calculators/A&P                      Patient presents to the ED with complaints of flank/abdominal pain  with nausea and vomiting. Patient nontoxic appearing, vitals without significant abnormalities. On exam patient is tender over left flank and in LLQ, no peritoneal signs. DDX: nephrolithiasis, pyelonephritis/UTI, cholecystitis, pancreatitis, bowel obstruction/perforation, appendicitis, dissection, feel nephrolithiasis is most likely at this time will evaluate with labs and CT renal study, analgesics, anti-emetics, and fluids ordered.   No leukocytosis, anemia, or significant electrolyte derangements.  CT renal study with 3 mm partially obstructing stone in the left mid ureter, multiple small intrarenal stones noted bilaterally. Renal function preserved. Urinalysis without appearance of superimposed infection. Patient tolerating PO in the ER with pain well controlled. Will discharge home with Flomax, Zofran, NSAID, and Percocet with urology follow up. [place] Controlled Substance reporting System queried. I discussed results, treatment plan, need for urology follow-up, and return precautions with the patient. Provided opportunity for questions, patient confirmed understanding and is in agreement with plan.   Final Clinical Impression(s) / ED Diagnoses Final diagnoses:  Left ureteral stone  Left flank pain  Non-intractable vomiting with nausea, unspecified vomiting type    Rx / DC Orders ED Discharge Orders         Ordered    ondansetron (ZOFRAN ODT) 4 MG disintegrating tablet     08/26/19 1747    ibuprofen (ADVIL) 800 MG tablet  3 times daily     08/26/19 1747    tamsulosin (FLOMAX) 0.4 MG CAPS capsule  Daily     08/26/19 1747    oxyCODONE-acetaminophen (PERCOCET) 5-325 MG tablet  Every 6 hours PRN     08/26/19 1747           Jacqlyn Larsen, PA-C 08/26/19 1807    Milton Ferguson, MD 08/27/19 [phone]

## 2019-08-26 NOTE — ED Triage Notes (Signed)
Flank pain with vomiting onset this am

## 2020-02-22 ENCOUNTER — Ambulatory Visit: Payer: Self-pay | Admitting: Obstetrics & Gynecology

## 2020-03-01 ENCOUNTER — Other Ambulatory Visit (HOSPITAL_COMMUNITY)
Admission: RE | Admit: 2020-03-01 | Discharge: 2020-03-01 | Disposition: A | Discharge status: Home / Self Care | Payer: Self-pay | Source: Ambulatory Visit | Attending: Obstetrics & Gynecology | Admitting: Obstetrics & Gynecology

## 2020-03-01 ENCOUNTER — Encounter: Payer: Self-pay | Admitting: Obstetrics & Gynecology

## 2020-03-01 ENCOUNTER — Ambulatory Visit: Payer: Self-pay | Admitting: Obstetrics & Gynecology

## 2020-03-01 VITALS — BP 119/79 | HR 73 | Wt 153.0 lb

## 2020-03-01 DIAGNOSIS — N946 Dysmenorrhea, unspecified: Secondary | ICD-10-CM

## 2020-03-01 DIAGNOSIS — N920 Excessive and frequent menstruation with regular cycle: Secondary | ICD-10-CM | POA: Insufficient documentation

## 2020-03-01 MED ORDER — DESOGESTREL-ETHINYL ESTRADIOL 0.15-30 MG-MCG PO TABS: 1.0000 | ORAL_TABLET | Freq: Every day | ORAL | 3 refills | Status: DC

## 2020-03-01 MED ORDER — NAPROXEN SODIUM 550 MG PO TABS: 550.0000 mg | ORAL_TABLET | Freq: Two times a day (BID) | ORAL | 1 refills | Status: DC

## 2020-03-01 NOTE — Progress Notes (Signed)
Chief Complaint  Patient presents with  . Menorrhagia    w/ clots      29 y.o. G0P0000 Patient's last menstrual period was 02/09/2020. The current method of family planning is none.  Outpatient Encounter Medications as of 03/01/2020  Medication Sig  . ibuprofen (ADVIL) 800 MG tablet Take 1 tablet (800 mg total) by mouth 3 (three) times daily.  Marland Kitchen desogestrel-ethinyl estradiol (APRI) 0.Marie-30 MG-MCG tablet Take 1 tablet by mouth daily.  . naproxen sodium (ANAPROX DS) 550 MG tablet Take 1 tablet (550 mg total) by mouth 2 (two) times daily with a meal.  . tamsulosin (FLOMAX) 0.4 MG CAPS capsule Take 1 capsule (0.4 mg total) by mouth daily. (Patient not taking: Reported on 03/01/2020)  . [DISCONTINUED] fluticasone (FLONASE) 50 MCG/ACT nasal spray Place 2 sprays into both nostrils daily.  . [DISCONTINUED] ondansetron (ZOFRAN ODT) 4 MG disintegrating tablet 4mg  ODT q4 hours prn nausea/vomit (Patient not taking: Reported on 03/01/2020)  . [DISCONTINUED] oxyCODONE-acetaminophen (PERCOCET) 5-325 MG tablet Take 1-2 tablets by mouth every 6 (six) hours as needed. (Patient not taking: Reported on 03/01/2020)  . [DISCONTINUED] terconazole (TERAZOL 7) 0.4 % vaginal cream Place 1 applicator vaginally at bedtime.   No facility-administered encounter medications on file as of 03/01/2020.    Subjective Pt with lifelong heavy painful periods No inter menses pain No dyspareunia Soils Pads +tampons Amenorrheic on depo provera Past Medical History:  Diagnosis Date  . Bipolar 2 disorder ([place])   . Carpal tunnel syndrome   . Kidney stones   . Migraine     Past Surgical History:  Procedure Laterality Date  . APPENDECTOMY    . FOOT SURGERY Bilateral 2005    OB History    Gravida  0   Para  0   Term  0   Preterm  0   AB  0   Living  0     SAB  0   TAB  0   Ectopic  0   Multiple  0   Live Births              No Known Allergies  Social History   Socioeconomic  History  . Marital status: Single    Spouse name: Not on file  . Number of children: Not on file  . Years of education: Not on file  . Highest education level: Not on file  Occupational History  . Not on file  Tobacco Use  . Smoking status: Current Every Day Smoker    Packs/day: 0.50    Years: 3.00    Pack years: 1.50    Types: Cigarettes    Last attempt to quit: 04/18/2016    Years since quitting: 3.8  . Smokeless tobacco: Never Used  Vaping Use  . Vaping Use: Never used  Substance and Sexual Activity  . Alcohol use: Yes    Comment: occ.  . Drug use: No  . Sexual activity: Yes    Birth control/protection: Condom  Other Topics Concern  . Not on file  Social History Narrative  . Not on file   Social Determinants of Health   Financial Resource Strain:   . Difficulty of Paying Living Expenses: Not on file  Food Insecurity:   . Worried About Charity fundraiser in the Last Year: Not on file  . Ran Out of Food in the Last Year: Not on file  Transportation Needs:   . Lack of Transportation (Medical): Not on  file  . Lack of Transportation (Non-Medical): Not on file  Physical Activity:   . Days of Exercise per Week: Not on file  . Minutes of Exercise per Session: Not on file  Stress:   . Feeling of Stress : Not on file  Social Connections:   . Frequency of Communication with Friends and Family: Not on file  . Frequency of Social Gatherings with Friends and Family: Not on file  . Attends Religious Services: Not on file  . Active Member of Clubs or Organizations: Not on file  . Attends Archivist Meetings: Not on file  . Marital Status: Not on file    Family History  Problem Relation Age of Onset  . Hypertension Mother   . Diabetes Other   . Thyroid disease Other   . Heart disease Other   . Birth defects Other   . Mental illness Other   . Stroke Other   . Breast cancer Other   . Graves' disease Maternal Grandmother   . Multiple sclerosis Maternal  Grandfather   . Suicidality Father     Medications:       Current Outpatient Medications:  .  ibuprofen (ADVIL) 800 MG tablet, Take 1 tablet (800 mg total) by mouth 3 (three) times daily., Disp: 21 tablet, Rfl: 0 .  desogestrel-ethinyl estradiol (APRI) 0.Marie-30 MG-MCG tablet, Take 1 tablet by mouth daily., Disp: 84 tablet, Rfl: 3 .  naproxen sodium (ANAPROX DS) 550 MG tablet, Take 1 tablet (550 mg total) by mouth 2 (two) times daily with a meal., Disp: 40 tablet, Rfl: 1 .  tamsulosin (FLOMAX) 0.4 MG CAPS capsule, Take 1 capsule (0.4 mg total) by mouth daily. (Patient not taking: Reported on 03/01/2020), Disp: 30 capsule, Rfl: 0  Objective Blood pressure 119/79, pulse 73, weight 153 lb (69.4 kg), last menstrual period 02/09/2020.  General WDWN female NAD Vulva:  normal appearing vulva with no masses, tenderness or lesions Vagina:  normal mucosa, no discharge Cervix:  Normal no lesions Uterus:  normal size, contour, position, consistency, mobility, non-tender Adnexa: ovaries:present,  normal adnexa in size, nontender and no masses   Pertinent ROS No burning with urination, frequency or urgency No nausea, vomiting or diarrhea Nor fever chills or other constitutional symptoms   Labs or studies     Impression Diagnoses this Encounter::   ICD-10-CM   1. Menorrhagia with regular cycle  N92.0 US PELVIS TRANSVAGINAL NON-OB (TV ONLY)   4-6 days, clots, tampons and pads together, soils  2. Dysmenorrhea  N94.6 US PELVIS TRANSVAGINAL NON-OB (TV ONLY)    Established relevant diagnosis(es):   Plan/Recommendations: Meds ordered this encounter  Medications  . desogestrel-ethinyl estradiol (APRI) 0.Marie-30 MG-MCG tablet    Sig: Take 1 tablet by mouth daily.    Dispense:  84 tablet    Refill:  3  . naproxen sodium (ANAPROX DS) 550 MG tablet    Sig: Take 1 tablet (550 mg total) by mouth 2 (two) times daily with a meal.    Dispense:  40 tablet    Refill:  1    Labs or Scans  Ordered: Orders Placed This Encounter  Procedures  . US PELVIS TRANSVAGINAL NON-OB (TV ONLY)    Management:: Try COC/NSAID approach, wants to avoid Depo  Follow up Return in about 3 months (around 06/01/2020) for GYN sono, Follow up, with Dr Elonda Husky.      All questions were answered.

## 2020-03-01 NOTE — Addendum Note (Signed)
Addended by: Octaviano Glow on: 03/01/2020 03:01 PM   Modules accepted: Orders

## 2020-03-06 LAB — CYTOLOGY - PAP
Chlamydia: NEGATIVE
Comment: NEGATIVE
Comment: NEGATIVE
Comment: NORMAL
Diagnosis: NEGATIVE
High risk HPV: NEGATIVE
Neisseria Gonorrhea: NEGATIVE

## 2020-03-13 ENCOUNTER — Other Ambulatory Visit: Payer: Self-pay | Admitting: Obstetrics & Gynecology

## 2020-03-13 MED ORDER — PROMETHAZINE HCL 25 MG PO TABS: 25.0000 mg | ORAL_TABLET | Freq: Four times a day (QID) | ORAL | 1 refills | Status: DC | PRN

## 2020-04-22 ENCOUNTER — Emergency Department (HOSPITAL_COMMUNITY)
Admission: EM | Admit: 2020-04-22 | Discharge: 2020-04-22 | Disposition: A | Discharge status: Home / Self Care | Payer: Self-pay | Attending: Emergency Medicine | Admitting: Emergency Medicine

## 2020-04-22 ENCOUNTER — Other Ambulatory Visit: Payer: Self-pay

## 2020-04-22 ENCOUNTER — Encounter (HOSPITAL_COMMUNITY): Payer: Self-pay | Admitting: Emergency Medicine

## 2020-04-22 DIAGNOSIS — F10120 Alcohol abuse with intoxication, uncomplicated: Secondary | ICD-10-CM

## 2020-04-22 DIAGNOSIS — F17210 Nicotine dependence, cigarettes, uncomplicated: Secondary | ICD-10-CM | POA: Insufficient documentation

## 2020-04-22 DIAGNOSIS — R1084 Generalized abdominal pain: Secondary | ICD-10-CM | POA: Insufficient documentation

## 2020-04-22 DIAGNOSIS — R112 Nausea with vomiting, unspecified: Secondary | ICD-10-CM | POA: Insufficient documentation

## 2020-04-22 DIAGNOSIS — F10129 Alcohol abuse with intoxication, unspecified: Secondary | ICD-10-CM | POA: Insufficient documentation

## 2020-04-22 LAB — COMPREHENSIVE METABOLIC PANEL
ALT: 29 U/L (ref 0–44)
AST: 39 U/L (ref 15–41)
Albumin: 4.8 g/dL (ref 3.5–5.0)
Alkaline Phosphatase: 47 U/L (ref 38–126)
Anion gap: 21 — ABNORMAL HIGH (ref 5–15)
BUN: 13 mg/dL (ref 6–20)
CO2: 15 mmol/L — ABNORMAL LOW (ref 22–32)
Calcium: 9.8 mg/dL (ref 8.9–10.3)
Chloride: 104 mmol/L (ref 98–111)
Creatinine, Ser: 0.74 mg/dL (ref 0.44–1.00)
GFR, Estimated: >60 mL/min (ref >60)
Glucose, Bld: 111 mg/dL — ABNORMAL HIGH (ref 70–99)
Potassium: 4.4 mmol/L (ref 3.5–5.1)
Sodium: 140 mmol/L (ref 135–145)
Total Bilirubin: 1.2 mg/dL (ref 0.3–1.2)
Total Protein: 8.2 g/dL — ABNORMAL HIGH (ref 6.5–8.1)

## 2020-04-22 LAB — URINALYSIS, ROUTINE W REFLEX MICROSCOPIC
Bilirubin Urine: NEGATIVE
Glucose, UA: NEGATIVE mg/dL
Ketones, ur: 80 mg/dL — AB
Nitrite: NEGATIVE
Protein, ur: 100 mg/dL — AB
Specific Gravity, Urine: 1.021 (ref 1.005–1.030)
pH: 6 (ref 5.0–8.0)

## 2020-04-22 LAB — CBC
HCT: 45.5 % (ref 36.0–46.0)
Hemoglobin: 14.6 g/dL (ref 12.0–15.0)
MCH: 31 pg (ref 26.0–34.0)
MCHC: 32.1 g/dL (ref 30.0–36.0)
MCV: 96.6 fL (ref 80.0–100.0)
Platelets: 276 10*3/uL (ref 150–400)
RBC: 4.71 MIL/uL (ref 3.87–5.11)
RDW: 13.3 % (ref 11.5–15.5)
WBC: 7.5 10*3/uL (ref 4.0–10.5)
nRBC: 0 % (ref 0.0–0.2)

## 2020-04-22 LAB — I-STAT BETA HCG BLOOD, ED (MC, WL, AP ONLY): I-stat hCG, quantitative: <5 m[IU]/mL (ref <5)

## 2020-04-22 LAB — LIPASE, BLOOD: Lipase: 19 U/L (ref 11–51)

## 2020-04-22 MED ORDER — ONDANSETRON 4 MG PO TBDP
4.0000 mg | ORAL_TABLET | Freq: Once | ORAL | Status: AC | PRN
  Administered 2020-04-22: 4 mg via ORAL
  Filled 2020-04-22: qty 1

## 2020-04-22 MED ORDER — METOCLOPRAMIDE HCL 5 MG/ML IJ SOLN
10.0000 mg | Freq: Once | INTRAMUSCULAR | Status: AC
  Administered 2020-04-22: 10 mg via INTRAVENOUS
  Filled 2020-04-22: qty 2

## 2020-04-22 MED ORDER — SODIUM CHLORIDE 0.9 % IV BOLUS
1000.0000 mL | Freq: Once | INTRAVENOUS | Status: AC
  Administered 2020-04-22: 1000 mL via INTRAVENOUS

## 2020-04-22 NOTE — ED Triage Notes (Signed)
Pt reports heavy ETOH use last night for her birthday.  C/o generalized abd pain, nausea, and vomiting.  Pt requesting water on arrival and states she is extremely thirsty.  Explained to her she can't have anything to drink right now due to vomiting.

## 2020-04-22 NOTE — ED Provider Notes (Signed)
[facility] EMERGENCY DEPARTMENT Provider Note   CSN: [id number] Arrival date & time: 04/22/20  1641     History Chief Complaint  Patient presents with  . Emesis    ETOH use  . Abdominal Pain    Marie Brown is a 29 y.o. female.  The history is provided by the patient. No language interpreter was used.  Emesis Associated symptoms: abdominal pain   Abdominal Pain Associated symptoms: vomiting      29 year old female significant history of bipolar disorder presenting with complaints of being hung over.  Patient report she celebrated her birthday last night and had too much to drink.  States she took at least 7 shots of alcohol.  This morning she endorsed having pain to the left side of her abdomen, has been nauseous, vomiting, unable to keep anything down and overall feeling very dehydrated.  She is now feeling thirsty.  She does not believe any fever chills no chest pain or shortness of breath no dysuria no bowel bladder changes.  She reported receiving Zofran while waiting but it did not provide adequate relief.  She still feels nauseous.  Past Medical History:  Diagnosis Date  . Bipolar 2 disorder ([place])   . Carpal tunnel syndrome   . Kidney stones   . Migraine     Patient Active Problem List   Diagnosis Date Noted  . Dysmenorrhea 11/09/2012  . ACUTE BRONCHOSPASM 12/12/2009  . NECK PAIN 06/13/2009  . NAUSEA AND VOMITING 05/24/2009  . ACUTE LARYNGITIS, WITHOUT MENTION OF OBSTRUCTIO 05/02/2009  . WEIGHT GAIN 04/22/2009  . HEADACHE 04/22/2009  . UNSPECIFIED VISUAL LOSS 01/17/2009  . ACUTE CYSTITIS 01/17/2009  . Vaginitis and vulvovaginitis, unspecified 01/17/2009  . FATIGUE 01/17/2009  . ACUTE SINUSITIS, UNSPECIFIED 11/08/2008  . ACUTE BRONCHITIS 11/08/2008  . BIPOLAR DISORDER UNSPECIFIED 08/12/2008  . NICOTINE ADDICTION 08/12/2008    Past Surgical History:  Procedure Laterality Date  . APPENDECTOMY    . FOOT SURGERY Bilateral 2005     OB  History    Gravida  0   Para  0   Term  0   Preterm  0   AB  0   Living  0     SAB  0   TAB  0   Ectopic  0   Multiple  0   Live Births              Family History  Problem Relation Age of Onset  . Hypertension Mother   . Diabetes Other   . Thyroid disease Other   . Heart disease Other   . Birth defects Other   . Mental illness Other   . Stroke Other   . Breast cancer Other   . Graves' disease Maternal Grandmother   . Multiple sclerosis Maternal Grandfather   . Suicidality Father     Social History   Tobacco Use  . Smoking status: Current Every Day Smoker    Packs/day: 0.50    Years: 3.00    Pack years: 1.50    Types: Cigarettes    Last attempt to quit: 04/18/2016    Years since quitting: 4.0  . Smokeless tobacco: Never Used  Vaping Use  . Vaping Use: Never used  Substance Use Topics  . Alcohol use: Yes    Comment: occ.  . Drug use: No    Home Medications Prior to Admission medications   Medication Sig Start Date End Date Taking? Authorizing Provider  desogestrel-ethinyl estradiol (  APRI) 0.15-30 MG-MCG tablet Take 1 tablet by mouth daily. 03/01/20   Florian Buff, MD  ibuprofen (ADVIL) 800 MG tablet Take 1 tablet (800 mg total) by mouth 3 (three) times daily. 08/26/19   Jacqlyn Larsen, PA-C  naproxen sodium (ANAPROX DS) 550 MG tablet Take 1 tablet (550 mg total) by mouth 2 (two) times daily with a meal. 03/01/20   Florian Buff, MD  promethazine (PHENERGAN) 25 MG tablet Take 1 tablet (25 mg total) by mouth every 6 (six) hours as needed for nausea or vomiting. 03/13/20   Florian Buff, MD  tamsulosin (FLOMAX) 0.4 MG CAPS capsule Take 1 capsule (0.4 mg total) by mouth daily. Patient not taking: Reported on 03/01/2020 08/26/19   Jacqlyn Larsen, PA-C    Allergies    Patient has no known allergies.  Review of Systems   Review of Systems  Gastrointestinal: Positive for abdominal pain and vomiting.  All other systems reviewed and are  negative.   Physical Exam Updated Vital Signs BP (!) 156/79   Pulse 85   Temp 98 F (36.7 C) (Oral)   Resp (!) 28   Ht 5\' 2"  (1.575 m)   Wt 68.5 kg   LMP 04/15/2020   SpO2 96%   BMI 27.62 kg/m   Physical Exam Vitals and nursing note reviewed.  Constitutional:      General: She is not in acute distress.    Appearance: She is well-developed.  HENT:     Head: Atraumatic.     Mouth/Throat:     Mouth: Mucous membranes are moist.  Eyes:     Conjunctiva/sclera: Conjunctivae normal.  Cardiovascular:     Rate and Rhythm: Normal rate and regular rhythm.     Heart sounds: Normal heart sounds.  Pulmonary:     Effort: Pulmonary effort is normal.     Breath sounds: Normal breath sounds.  Abdominal:     General: Abdomen is flat.     Palpations: Abdomen is soft.     Tenderness: There is no abdominal tenderness.  Musculoskeletal:     Cervical back: Neck supple.  Skin:    Findings: No rash.  Neurological:     Mental Status: She is alert.     ED Results / Procedures / Treatments   Labs (all labs ordered are listed, but only abnormal results are displayed) Labs Reviewed  COMPREHENSIVE METABOLIC PANEL - Abnormal; Notable for the following components:      Result Value   CO2 15 (*)    Glucose, Bld 111 (*)    Total Protein 8.2 (*)    Anion gap 21 (*)    All other components within normal limits  URINALYSIS, ROUTINE W REFLEX MICROSCOPIC - Abnormal; Notable for the following components:   APPearance CLOUDY (*)    Hgb urine dipstick MODERATE (*)    Ketones, ur 80 (*)    Protein, ur 100 (*)    Leukocytes,Ua TRACE (*)    Bacteria, UA RARE (*)    All other components within normal limits  LIPASE, BLOOD  CBC  I-STAT BETA HCG BLOOD, ED ([facility], [facility], [facility] ONLY)    EKG None  Radiology No results found.  Procedures Procedures (including critical care time)  Medications Ordered in ED Medications  ondansetron (ZOFRAN-ODT) disintegrating tablet 4 mg (4 mg Oral Given 04/22/20  1719)  sodium chloride 0.9 % bolus 1,000 mL (1,000 mLs Intravenous New Bag/Given 04/22/20 1914)  metoCLOPramide (REGLAN) injection 10 mg (10 mg Intravenous  Given 04/22/20 1914)    ED Course  I have reviewed the triage vital signs and the nursing notes.  Pertinent labs & imaging results that were available during my care of the patient were reviewed by me and considered in my medical decision making (see chart for details).    MDM Rules/Calculators/A&P                          BP (!) 156/79   Pulse 85   Temp 98 F (36.7 C) (Oral)   Resp (!) 28   Ht 5\' 2"  (1.575 m)   Wt 68.5 kg   LMP 04/15/2020   SpO2 96%   BMI 27.62 kg/m   Final Clinical Impression(s) / ED Diagnoses Final diagnoses:  Hangover without complication ([place])    Rx / DC Orders ED Discharge Orders    None     6:48 PM Patient here for pain however after heavy drinking last night to celebrate her birthday.  She is overall well-appearing and have a benign abdominal exam.  Labs are reassuring.Evidence of hemoglobin and urine dipsticks however patient has had blood in her urine from prior UA.  Encourage patient to follow-up with nephrology for further evaluation but otherwise we will give IV fluid and antinausea medication.  Normal lipase, doubt pancreatitis.  No urinary symptoms to suggest UTI.   Domenic Moras, PA-C 04/22/20 2013    Isla Pence, MD 04/22/20 2044

## 2020-04-22 NOTE — Discharge Instructions (Addendum)
Please avoid heavy alcohol consumption as it can negatively affect your health.

## 2020-05-21 ENCOUNTER — Other Ambulatory Visit: Payer: Self-pay | Admitting: Adult Health

## 2020-05-21 MED ORDER — METRONIDAZOLE 500 MG PO TABS: 500.0000 mg | ORAL_TABLET | Freq: Two times a day (BID) | ORAL | 0 refills | Status: DC

## 2020-05-21 MED ORDER — FLUCONAZOLE 150 MG PO TABS: ORAL_TABLET | ORAL | 1 refills | Status: DC

## 2020-05-21 NOTE — Progress Notes (Signed)
Will rx flagyl and diflucan at her request

## 2020-05-29 ENCOUNTER — Emergency Department (HOSPITAL_COMMUNITY)
Admission: EM | Admit: 2020-05-29 | Discharge: 2020-05-29 | Disposition: A | Discharge status: Home / Self Care | Payer: Self-pay | Attending: Emergency Medicine | Admitting: Emergency Medicine

## 2020-05-29 ENCOUNTER — Other Ambulatory Visit: Payer: Self-pay

## 2020-05-29 ENCOUNTER — Encounter (HOSPITAL_COMMUNITY): Payer: Self-pay | Admitting: Emergency Medicine

## 2020-05-29 DIAGNOSIS — F17210 Nicotine dependence, cigarettes, uncomplicated: Secondary | ICD-10-CM | POA: Insufficient documentation

## 2020-05-29 DIAGNOSIS — Z20822 Contact with and (suspected) exposure to covid-19: Secondary | ICD-10-CM | POA: Insufficient documentation

## 2020-05-29 DIAGNOSIS — J029 Acute pharyngitis, unspecified: Secondary | ICD-10-CM | POA: Insufficient documentation

## 2020-05-29 LAB — POC URINE PREG, ED: Preg Test, Ur: NEGATIVE

## 2020-05-29 LAB — SARS CORONAVIRUS 2 (TAT 6-24 HRS): SARS Coronavirus 2: NEGATIVE

## 2020-05-29 LAB — GROUP A STREP BY PCR: Group A Strep by PCR: NOT DETECTED

## 2020-05-29 MED ORDER — LIDOCAINE VISCOUS HCL 2 % MT SOLN
15.0000 mL | Freq: Once | OROMUCOSAL | Status: AC
  Administered 2020-05-29: 15 mL via OROMUCOSAL
  Filled 2020-05-29: qty 15

## 2020-05-29 MED ORDER — DEXAMETHASONE SODIUM PHOSPHATE 10 MG/ML IJ SOLN
10.0000 mg | Freq: Once | INTRAMUSCULAR | Status: AC
  Administered 2020-05-29: 10 mg via INTRAVENOUS
  Filled 2020-05-29: qty 1

## 2020-05-29 NOTE — Discharge Instructions (Addendum)
Your strep test today was negative. You were tested for COVID-19 today, you may follow-up on these results through the MyChart app. There are instructions on your discharge paperwork on how to download this. Your sore throat is likely due to a virus we do not treat with antibiotics, please use warm salt water gargles, warm liquids, over-the-counter sore throat medicines. Return to the ER for any worsening fevers, drooling, inability to swallow or any new or worsening symptoms.

## 2020-05-29 NOTE — ED Provider Notes (Signed)
[facility] DEPT Provider Note   CSN: [id number] Arrival date & time: 05/29/20  0830     History Chief Complaint  Patient presents with  . Sore Throat    Marie Brown is a 30 y.o. female.  HPI 30 year old female with history bipolar 2 disorder, migraine, carpal tunnel presents to the ER with complaints of sore throat. Patient states that she woke up this morning with a severely sore throat. Feels like she lost her voice. Denies any drooling or inability swallow. Reports severe pain with swallowing. Feels like her neck is painful to. She has not noticed any swelling. No neck stiffness, still able to move her neck. She has not taken anything for symptoms. Endorses prior history of strep. No known fevers, cough, nausea, vomiting, diarrhea, abdominal pain    Past Medical History:  Diagnosis Date  . Bipolar 2 disorder ([place])   . Carpal tunnel syndrome   . Kidney stones   . Migraine     Patient Active Problem List   Diagnosis Date Noted  . Dysmenorrhea 11/09/2012  . ACUTE BRONCHOSPASM 12/12/2009  . NECK PAIN 06/13/2009  . NAUSEA AND VOMITING 05/24/2009  . ACUTE LARYNGITIS, WITHOUT MENTION OF OBSTRUCTIO 05/02/2009  . WEIGHT GAIN 04/22/2009  . HEADACHE 04/22/2009  . UNSPECIFIED VISUAL LOSS 01/17/2009  . ACUTE CYSTITIS 01/17/2009  . Vaginitis and vulvovaginitis, unspecified 01/17/2009  . FATIGUE 01/17/2009  . ACUTE SINUSITIS, UNSPECIFIED 11/08/2008  . ACUTE BRONCHITIS 11/08/2008  . BIPOLAR DISORDER UNSPECIFIED 08/12/2008  . NICOTINE ADDICTION 08/12/2008    Past Surgical History:  Procedure Laterality Date  . APPENDECTOMY    . FOOT SURGERY Bilateral 2005     OB History    Gravida  0   Para  0   Term  0   Preterm  0   AB  0   Living  0     SAB  0   IAB  0   Ectopic  0   Multiple  0   Live Births              Family History  Problem Relation Age of Onset  . Hypertension Mother   . Diabetes Other   . Thyroid  disease Other   . Heart disease Other   . Birth defects Other   . Mental illness Other   . Stroke Other   . Breast cancer Other   . Graves' disease Maternal Grandmother   . Multiple sclerosis Maternal Grandfather   . Suicidality Father     Social History   Tobacco Use  . Smoking status: Current Every Day Smoker    Packs/day: 0.50    Years: 3.00    Pack years: 1.50    Types: Cigarettes    Last attempt to quit: 04/18/2016    Years since quitting: 4.1  . Smokeless tobacco: Never Used  Vaping Use  . Vaping Use: Never used  Substance Use Topics  . Alcohol use: Yes    Comment: occ.  . Drug use: No    Home Medications Prior to Admission medications   Medication Sig Start Date End Date Taking? Authorizing Provider  fluconazole (DIFLUCAN) 150 MG tablet Take 1 now and 1 in 3 days 05/21/20   Estill Dooms, NP  metroNIDAZOLE (FLAGYL) 500 MG tablet Take 1 tablet (500 mg total) by mouth 2 (two) times daily. 05/21/20   Estill Dooms, NP  desogestrel-ethinyl estradiol (APRI) 0.15-30 MG-MCG tablet Take 1 tablet by mouth daily. 03/01/20  Florian Buff, MD  ibuprofen (ADVIL) 800 MG tablet Take 1 tablet (800 mg total) by mouth 3 (three) times daily. 08/26/19   Jacqlyn Larsen, PA-C  naproxen sodium (ANAPROX DS) 550 MG tablet Take 1 tablet (550 mg total) by mouth 2 (two) times daily with a meal. 03/01/20   Florian Buff, MD  promethazine (PHENERGAN) 25 MG tablet Take 1 tablet (25 mg total) by mouth every 6 (six) hours as needed for nausea or vomiting. 03/13/20   Florian Buff, MD  tamsulosin (FLOMAX) 0.4 MG CAPS capsule Take 1 capsule (0.4 mg total) by mouth daily. Patient not taking: Reported on 03/01/2020 08/26/19   Jacqlyn Larsen, PA-C    Allergies    Patient has no known allergies.  Review of Systems   Review of Systems  Constitutional: Negative for chills and fever.  HENT: Positive for sore throat and voice change. Negative for congestion, dental problem, drooling, facial  swelling and trouble swallowing.   Respiratory: Negative for cough and shortness of breath.   Cardiovascular: Negative for chest pain.  Gastrointestinal: Negative for abdominal pain.    Physical Exam Updated Vital Signs BP 133/70 (BP Location: Left Arm)   Pulse 82   Temp 98.2 F (36.8 C) (Oral)   Resp 16   SpO2 100%   Physical Exam Vitals and nursing note reviewed.  Constitutional:      General: She is not in acute distress.    Appearance: She is well-developed and well-nourished.  HENT:     Head: Normocephalic and atraumatic.     Mouth/Throat:     Mouth: Mucous membranes are moist.     Pharynx: No pharyngeal swelling or oropharyngeal exudate.     Tonsils: No tonsillar exudate. 0 on the right. 0 on the left.     Comments: Oropharynx mildly erythematous without exudates, uvula midline, no unilateral tonsillar swelling, tongue normal size and midline, no sublingual/submandibular swellimg, tolerating secretions well. No swelling to the neck, soft, nontender, no erythema. No evidence of dental caries, No tripoding, no muffled voice Eyes:     Conjunctiva/sclera: Conjunctivae normal.  Cardiovascular:     Rate and Rhythm: Normal rate and regular rhythm.     Heart sounds: No murmur heard.   Pulmonary:     Effort: Pulmonary effort is normal. No respiratory distress.     Breath sounds: Normal breath sounds.  Abdominal:     Palpations: Abdomen is soft.     Tenderness: There is no abdominal tenderness.  Musculoskeletal:        General: No edema.     Cervical back: Neck supple.  Skin:    General: Skin is warm and dry.  Neurological:     Mental Status: She is alert.  Psychiatric:        Mood and Affect: Mood and affect normal.     ED Results / Procedures / Treatments   Labs (all labs ordered are listed, but only abnormal results are displayed) Labs Reviewed  GROUP A STREP BY PCR  SARS CORONAVIRUS 2 (TAT 6-24 HRS)  POC URINE PREG, ED    EKG None  Radiology No  results found.  Procedures Procedures (including critical care time)  Medications Ordered in ED Medications  dexamethasone (DECADRON) injection 10 mg (has no administration in time range)  lidocaine (XYLOCAINE) 2 % viscous mouth solution 15 mL (has no administration in time range)    ED Course  I have reviewed the triage vital signs and the nursing notes.  Pertinent labs & imaging results that were available during my care of the patient were reviewed by me and considered in my medical decision making (see chart for details).    MDM Rules/Calculators/A&P                          30 year old female presents with sore throat. Patient is afebrile, without tonsillar exudates, cervical of adenopathy, endorses dysphagia. No tripoding, no drooling, tolerating secretions well. No noticeable muffled voice. Strep test is negative. Low suspicion for RPA/PTA or Ludwig's angina. Suspect viral source. Patient will be swabbed for COVID-19 here. Patient was educated on supportive care this is likely due to a viral source, warm salt water gargles. Discussed importance of water rehydration. Given a shot of Decadron here and provided viscous lidocaine. Return precautions discussed. She voiced understanding.  Final Clinical Impression(s) / ED Diagnoses Final diagnoses:  Viral pharyngitis    Rx / DC Orders ED Discharge Orders    None       Lyndel Safe 05/29/20 1213    Long, Wonda Olds, MD 05/31/20 1032

## 2020-05-29 NOTE — ED Triage Notes (Signed)
Per pt, states she woke up this am with a sore throat and lost her voice-states her neck hurts as well-states she is 7 days late getting period although home pregnancy test/s negative

## 2020-06-04 ENCOUNTER — Ambulatory Visit: Payer: Self-pay | Admitting: Obstetrics & Gynecology

## 2020-06-04 ENCOUNTER — Other Ambulatory Visit: Payer: Self-pay

## 2020-06-19 ENCOUNTER — Other Ambulatory Visit: Payer: Self-pay

## 2020-06-19 ENCOUNTER — Ambulatory Visit: Payer: Self-pay | Admitting: Obstetrics & Gynecology

## 2020-09-25 ENCOUNTER — Other Ambulatory Visit: Payer: Self-pay

## 2020-09-25 MED ORDER — METRONIDAZOLE 500 MG PO TABS: 500.0000 mg | ORAL_TABLET | Freq: Two times a day (BID) | ORAL | 0 refills | Status: DC

## 2020-11-01 ENCOUNTER — Emergency Department (HOSPITAL_COMMUNITY)
Admission: EM | Admit: 2020-11-01 | Discharge: 2020-11-01 | Disposition: A | Discharge status: Home / Self Care | Payer: Self-pay | Attending: Emergency Medicine | Admitting: Emergency Medicine

## 2020-11-01 DIAGNOSIS — Z23 Encounter for immunization: Secondary | ICD-10-CM | POA: Insufficient documentation

## 2020-11-01 DIAGNOSIS — S51851A Open bite of right forearm, initial encounter: Secondary | ICD-10-CM | POA: Insufficient documentation

## 2020-11-01 DIAGNOSIS — S71152A Open bite, left thigh, initial encounter: Secondary | ICD-10-CM | POA: Insufficient documentation

## 2020-11-01 DIAGNOSIS — F17210 Nicotine dependence, cigarettes, uncomplicated: Secondary | ICD-10-CM | POA: Insufficient documentation

## 2020-11-01 DIAGNOSIS — W503XXA Accidental bite by another person, initial encounter: Secondary | ICD-10-CM

## 2020-11-01 LAB — CBC
HCT: 43.8 % (ref 36.0–46.0)
Hemoglobin: 13.7 g/dL (ref 12.0–15.0)
MCH: 31.1 pg (ref 26.0–34.0)
MCHC: 31.3 g/dL (ref 30.0–36.0)
MCV: 99.3 fL (ref 80.0–100.0)
Platelets: 276 10*3/uL (ref 150–400)
RBC: 4.41 MIL/uL (ref 3.87–5.11)
RDW: 13 % (ref 11.5–15.5)
WBC: 4.4 10*3/uL (ref 4.0–10.5)
nRBC: 0 % (ref 0.0–0.2)

## 2020-11-01 LAB — BASIC METABOLIC PANEL
Anion gap: 8 (ref 5–15)
BUN: 8 mg/dL (ref 6–20)
CO2: 29 mmol/L (ref 22–32)
Calcium: 9.8 mg/dL (ref 8.9–10.3)
Chloride: 101 mmol/L (ref 98–111)
Creatinine, Ser: 0.89 mg/dL (ref 0.44–1.00)
GFR, Estimated: >60 mL/min (ref >60)
Glucose, Bld: 112 mg/dL — ABNORMAL HIGH (ref 70–99)
Potassium: 4.1 mmol/L (ref 3.5–5.1)
Sodium: 138 mmol/L (ref 135–145)

## 2020-11-01 LAB — POC URINE PREG, ED: Preg Test, Ur: NEGATIVE

## 2020-11-01 MED ORDER — AMOXICILLIN-POT CLAVULANATE 875-125 MG PO TABS: 1.0000 | ORAL_TABLET | Freq: Once | ORAL | Status: DC

## 2020-11-01 MED ORDER — AMOXICILLIN-POT CLAVULANATE 875-125 MG PO TABS
1.0000 | ORAL_TABLET | Freq: Once | ORAL | Status: AC
  Administered 2020-11-01: 1 via ORAL
  Filled 2020-11-01: qty 1

## 2020-11-01 MED ORDER — AMOXICILLIN-POT CLAVULANATE 875-125 MG PO TABS: 1.0000 | ORAL_TABLET | Freq: Two times a day (BID) | ORAL | 0 refills | Status: DC

## 2020-11-01 MED ORDER — TETANUS-DIPHTH-ACELL PERTUSSIS 5-2.5-18.5 LF-MCG/0.5 IM SUSY
0.5000 mL | PREFILLED_SYRINGE | Freq: Once | INTRAMUSCULAR | Status: AC
  Administered 2020-11-01: 0.5 mL via INTRAMUSCULAR
  Filled 2020-11-01: qty 0.5
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
## 2020-11-01 NOTE — ED Notes (Signed)
Neg pregnancy per [employer] edp notified
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
## 2020-11-01 NOTE — ED Notes (Signed)
ED Provider at bedside. 
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
## 2021-01-26 ENCOUNTER — Encounter (HOSPITAL_COMMUNITY): Payer: Self-pay

## 2021-01-26 ENCOUNTER — Emergency Department (HOSPITAL_COMMUNITY): Payer: Self-pay

## 2021-01-26 ENCOUNTER — Other Ambulatory Visit: Payer: Self-pay

## 2021-01-26 ENCOUNTER — Emergency Department (HOSPITAL_COMMUNITY)
Admission: EM | Admit: 2021-01-26 | Discharge: 2021-01-26 | Disposition: A | Discharge status: Home / Self Care | Payer: Self-pay | Attending: Emergency Medicine | Admitting: Emergency Medicine

## 2021-01-26 DIAGNOSIS — F17210 Nicotine dependence, cigarettes, uncomplicated: Secondary | ICD-10-CM | POA: Insufficient documentation

## 2021-01-26 DIAGNOSIS — A599 Trichomoniasis, unspecified: Secondary | ICD-10-CM | POA: Insufficient documentation

## 2021-01-26 DIAGNOSIS — N200 Calculus of kidney: Secondary | ICD-10-CM | POA: Insufficient documentation

## 2021-01-26 DIAGNOSIS — N9489 Other specified conditions associated with female genital organs and menstrual cycle: Secondary | ICD-10-CM | POA: Insufficient documentation

## 2021-01-26 DIAGNOSIS — R109 Unspecified abdominal pain: Secondary | ICD-10-CM

## 2021-01-26 LAB — CBC WITH DIFFERENTIAL/PLATELET
Abs Immature Granulocytes: 0.01 10*3/uL (ref 0.00–0.07)
Basophils Absolute: 0 10*3/uL (ref 0.0–0.1)
Basophils Relative: 0 %
Eosinophils Absolute: 0.1 10*3/uL (ref 0.0–0.5)
Eosinophils Relative: 1 %
HCT: 40.5 % (ref 36.0–46.0)
Hemoglobin: 13 g/dL (ref 12.0–15.0)
Immature Granulocytes: 0 %
Lymphocytes Relative: 28 %
Lymphs Abs: 1.6 10*3/uL (ref 0.7–4.0)
MCH: 32.4 pg (ref 26.0–34.0)
MCHC: 32.1 g/dL (ref 30.0–36.0)
MCV: 101 fL — ABNORMAL HIGH (ref 80.0–100.0)
Monocytes Absolute: 0.7 10*3/uL (ref 0.1–1.0)
Monocytes Relative: 11 %
Neutro Abs: 3.4 10*3/uL (ref 1.7–7.7)
Neutrophils Relative %: 60 %
Platelets: 238 10*3/uL (ref 150–400)
RBC: 4.01 MIL/uL (ref 3.87–5.11)
RDW: 13.1 % (ref 11.5–15.5)
WBC: 5.8 10*3/uL (ref 4.0–10.5)
nRBC: 0 % (ref 0.0–0.2)

## 2021-01-26 LAB — COMPREHENSIVE METABOLIC PANEL
ALT: 19 U/L (ref 0–44)
AST: 21 U/L (ref 15–41)
Albumin: 4 g/dL (ref 3.5–5.0)
Alkaline Phosphatase: 39 U/L (ref 38–126)
Anion gap: 3 — ABNORMAL LOW (ref 5–15)
BUN: 5 mg/dL — ABNORMAL LOW (ref 6–20)
CO2: 27 mmol/L (ref 22–32)
Calcium: 8.9 mg/dL (ref 8.9–10.3)
Chloride: 104 mmol/L (ref 98–111)
Creatinine, Ser: 0.67 mg/dL (ref 0.44–1.00)
GFR, Estimated: >60 mL/min (ref >60)
Glucose, Bld: 102 mg/dL — ABNORMAL HIGH (ref 70–99)
Potassium: 4 mmol/L (ref 3.5–5.1)
Sodium: 134 mmol/L — ABNORMAL LOW (ref 135–145)
Total Bilirubin: 0.8 mg/dL (ref 0.3–1.2)
Total Protein: 6.8 g/dL (ref 6.5–8.1)

## 2021-01-26 LAB — URINALYSIS, MICROSCOPIC (REFLEX)

## 2021-01-26 LAB — URINALYSIS, ROUTINE W REFLEX MICROSCOPIC
Bilirubin Urine: NEGATIVE
Glucose, UA: NEGATIVE mg/dL
Ketones, ur: NEGATIVE mg/dL
Nitrite: NEGATIVE
Protein, ur: NEGATIVE mg/dL
Specific Gravity, Urine: 1.015 (ref 1.005–1.030)
pH: 6 (ref 5.0–8.0)

## 2021-01-26 LAB — LIPASE, BLOOD: Lipase: 29 U/L (ref 11–51)

## 2021-01-26 LAB — I-STAT BETA HCG BLOOD, ED (MC, WL, AP ONLY): I-stat hCG, quantitative: <5 m[IU]/mL (ref <5)

## 2021-01-26 MED ORDER — METRONIDAZOLE 500 MG PO TABS
500.0000 mg | ORAL_TABLET | Freq: Two times a day (BID) | ORAL | 0 refills | Status: DC
Start: 2021-01-26 — End: 2021-04-03

## 2021-01-26 MED ORDER — ONDANSETRON 4 MG PO TBDP: 4.0000 mg | ORAL_TABLET | Freq: Three times a day (TID) | ORAL | 0 refills | Status: DC | PRN

## 2021-01-26 NOTE — Discharge Instructions (Signed)
Your urinalysis was positive for trichomonas.  I recommend following up with the [facility] for further STD testing. Please follow-up with your primary care provider.  If you do not have 1 please call the [facility] and provide information for their office.

## 2021-01-26 NOTE — ED Provider Notes (Signed)
[facility] EMERGENCY DEPARTMENT Provider Note   CSN: [id number] Arrival date & time: 01/26/21  0809     History Chief Complaint  Patient presents with   Flank Pain    Right     Marie Brown is a 30 y.o. female.  HPI Patient is a 30 year old female with past medical history of bipolar, kidney stones, migraines  Patient is presenting to the emergency room today with complaints of right flank pain she states that she has had the symptoms for 2 or 3 days.  She endorses some nausea has had several episodes of nonbloody nonbilious emesis.  She states that she was concerned she had a kidney stone.  She states her pain is severe constant 8/10.  Denies any urinary frequency urgency dysuria hematuria.  Denies any vaginal discharge or any dyspareunia.  Denies any pelvic pain.  States that the right flank pain seems to radiate down into her side some  Denies any chest pain or shortness of breath no lightheadedness or dizziness.  No other associate symptoms.  No aggravating mitigating factors apart from sometimes it is worse when she bends over and straightens up.      Past Medical History:  Diagnosis Date   Bipolar 2 disorder ([place])    Carpal tunnel syndrome    Kidney stones    Migraine     Patient Active Problem List   Diagnosis Date Noted   Dysmenorrhea 11/09/2012   ACUTE BRONCHOSPASM 12/12/2009   NECK PAIN 06/13/2009   NAUSEA AND VOMITING 05/24/2009   ACUTE LARYNGITIS, WITHOUT MENTION OF OBSTRUCTIO 05/02/2009   WEIGHT GAIN 04/22/2009   HEADACHE 04/22/2009   UNSPECIFIED VISUAL LOSS 01/17/2009   ACUTE CYSTITIS 01/17/2009   Vaginitis and vulvovaginitis, unspecified 01/17/2009   FATIGUE 01/17/2009   ACUTE SINUSITIS, UNSPECIFIED 11/08/2008   ACUTE BRONCHITIS 11/08/2008   BIPOLAR DISORDER UNSPECIFIED 08/12/2008   NICOTINE ADDICTION 08/12/2008    Past Surgical History:  Procedure Laterality Date   APPENDECTOMY     FOOT SURGERY Bilateral 2005      OB History     Gravida  0   Para  0   Term  0   Preterm  0   AB  0   Living  0      SAB  0   IAB  0   Ectopic  0   Multiple  0   Live Births              Family History  Problem Relation Age of Onset   Hypertension Mother    Diabetes Other    Thyroid disease Other    Heart disease Other    Birth defects Other    Mental illness Other    Stroke Other    Breast cancer Other    Graves' disease Maternal Grandmother    Multiple sclerosis Maternal Grandfather    Suicidality Father     Social History   Tobacco Use   Smoking status: Every Day    Packs/day: 0.50    Years: 3.00    Pack years: 1.50    Types: Cigarettes    Last attempt to quit: 04/18/2016    Years since quitting: 4.7   Smokeless tobacco: Never  Vaping Use   Vaping Use: Never used  Substance Use Topics   Alcohol use: Yes    Comment: occ.   Drug use: No    Home Medications Prior to Admission medications   Medication Sig Start Date End Date  Taking? Authorizing Provider  acetaminophen (TYLENOL) 325 MG tablet Take 325 mg by mouth every 6 (six) hours as needed for moderate pain.   Yes [provider]  metroNIDAZOLE (FLAGYL) 500 MG tablet Take 1 tablet (500 mg total) by mouth 2 (two) times daily. 01/26/21  Yes Ashlen Kiger S, PA  amoxicillin-clavulanate (AUGMENTIN) 875-125 MG tablet Take 1 tablet by mouth every 12 (twelve) hours. Patient not taking: Reported on 01/26/2021 11/01/20   Horton, Alvin Critchley, DO  desogestrel-ethinyl estradiol (APRI) 0.15-30 MG-MCG tablet Take 1 tablet by mouth daily. Patient not taking: Reported on 01/26/2021 03/01/20   Florian Buff, MD  ibuprofen (ADVIL) 800 MG tablet Take 1 tablet (800 mg total) by mouth 3 (three) times daily. Patient not taking: Reported on 01/26/2021 08/26/19   Jacqlyn Larsen, PA-C    Allergies    Patient has no known allergies.  Review of Systems   Review of Systems  Constitutional:  Negative for chills and fever.  HENT:   Negative for congestion.   Eyes:  Negative for pain.  Respiratory:  Negative for cough and shortness of breath.   Cardiovascular:  Negative for chest pain and leg swelling.  Gastrointestinal:  Negative for abdominal pain, nausea and vomiting.  Genitourinary:  Positive for flank pain. Negative for dysuria.  Musculoskeletal:  Negative for myalgias.  Skin:  Negative for rash.  Neurological:  Negative for dizziness and headaches.   Physical Exam Updated Vital Signs BP 139/85   Pulse 71   Temp 98.1 F (36.7 C) (Oral)   Resp 16   Ht '5\' 3"'$  (1.6 m)   Wt 74.8 kg   SpO2 99%   BMI 29.23 kg/m   Physical Exam Vitals and nursing note reviewed.  Constitutional:      General: She is not in acute distress.    Appearance: She is not ill-appearing.     Comments: Pleasant well-appearing 30 year old.  In no acute distress.  Sitting comfortably in bed.  Able answer questions appropriately follow commands. No increased work of breathing. Speaking in full sentences.   HENT:     Head: Normocephalic and atraumatic.     Nose: Nose normal.  Eyes:     General: No scleral icterus. Cardiovascular:     Rate and Rhythm: Normal rate and regular rhythm.     Pulses: Normal pulses.     Heart sounds: Normal heart sounds.  Pulmonary:     Effort: Pulmonary effort is normal. No respiratory distress.     Breath sounds: No wheezing.  Abdominal:     Palpations: Abdomen is soft.     Tenderness: There is no abdominal tenderness. There is right CVA tenderness. There is no left CVA tenderness.     Comments: There is muscular tenderness to palpation of the right CVA region.  There is also tenderness with percussion of the CVA region.  No abdominal tenderness on palpation no guarding or rebound.  Genitourinary:    Comments: Deferred Musculoskeletal:     Cervical back: Normal range of motion.     Right lower leg: No edema.     Left lower leg: No edema.  Skin:    General: Skin is warm and dry.     Capillary  Refill: Capillary refill takes less than 2 seconds.  Neurological:     Mental Status: She is alert. Mental status is at baseline.  Psychiatric:        Mood and Affect: Mood normal.        Behavior:  Behavior normal.    ED Results / Procedures / Treatments   Labs (all labs ordered are listed, but only abnormal results are displayed) Labs Reviewed  CBC WITH DIFFERENTIAL/PLATELET - Abnormal; Notable for the following components:      Result Value   MCV 101.0 (*)    All other components within normal limits  COMPREHENSIVE METABOLIC PANEL - Abnormal; Notable for the following components:   Sodium 134 (*)    Glucose, Bld 102 (*)    BUN 5 (*)    Anion gap 3 (*)    All other components within normal limits  URINALYSIS, ROUTINE W REFLEX MICROSCOPIC - Abnormal; Notable for the following components:   Hgb urine dipstick SMALL (*)    Leukocytes,Ua SMALL (*)    All other components within normal limits  URINALYSIS, MICROSCOPIC (REFLEX) - Abnormal; Notable for the following components:   Bacteria, UA RARE (*)    Trichomonas, UA PRESENT (*)    All other components within normal limits  LIPASE, BLOOD  I-STAT BETA HCG BLOOD, ED ([facility], [facility], [facility] ONLY)    EKG None  Radiology CT Renal Stone Study  Result Date: 01/26/2021 CLINICAL DATA:  Right flank pain for 2 days. History of kidney stones. EXAM: CT ABDOMEN AND PELVIS WITHOUT CONTRAST TECHNIQUE: Multidetector CT imaging of the abdomen and pelvis was performed following the standard protocol without IV contrast. COMPARISON:  08/26/2019 FINDINGS: Lower chest: Anterior right lung base scarring. Normal heart size without pericardial or pleural effusion. Hepatobiliary: Normal liver. Normal gallbladder, without biliary ductal dilatation. Pancreas: Normal, without mass or ductal dilatation. Spleen: Normal in size, without focal abnormality. Adrenals/Urinary Tract: Normal adrenal glands. Bilateral renal collecting system calculi of up to 3 mm. No  hydronephrosis. No hydroureter or ureteric calculi. No bladder calculi. Stomach/Bowel: Normal stomach, without wall thickening. Colonic stool burden suggests constipation. Normal terminal ileum. Appendectomy. Normal small bowel. Vascular/Lymphatic: Normal aortic caliber. No abdominopelvic adenopathy. Reproductive: Normal uterus and adnexa. Other: No significant free fluid.  No free intraperitoneal air. Musculoskeletal: No acute osseous abnormality. IMPRESSION: 1. Bilateral nephrolithiasis, without obstructive uropathy. 2. Possible constipation. Electronically Signed   By: Abigail Miyamoto M.D.   On: 01/26/2021 10:18    Procedures Procedures   Medications Ordered in ED Medications - No data to display  ED Course  I have reviewed the triage vital signs and the nursing notes.  Pertinent labs & imaging results that were available during my care of the patient were reviewed by me and considered in my medical decision making (see chart for details).    MDM Rules/Calculators/A&P                          Patient is presenting to the emergency room today with complaints of right flank pain she states that she has had the symptoms for 2 or 3 days.  She endorses some nausea has had several episodes of nonbloody nonbilious emesis.  On the time of my evaluation of patient she states her pain is somewhat improved.  She has not received any medications for this.  She denies any nausea or vomiting since she has been in the ER for the past 6 and half hours.  Physical exam is unremarkable.  I personally reviewed all laboratory work and imaging.  Metabolic panel without any acute abnormality specifically kidney function within normal limits and no significant electrolyte abnormalities. CBC without leukocytosis or significant anemia.   CT scan personally reviewed agree of radiology  read. IMPRESSION:  1. Bilateral nephrolithiasis, without obstructive uropathy.  2. Possible constipation.   As patient is  symptoms of significantly improved she may have recently passed a small stone she does have a small amount of hemoglobin in her urine.  Doubt pyelonephritis as her urinalysis is relatively unremarkable and she is not experiencing any significant urinary frequency urgency or dysuria.  She was found to have trichomonas.  We will treat with Flagyl.  She will follow-up with the health department as she has been in the ER for an extended period of time today and does not wish further testing at this time.  She is eating and drinking tolerating p.o. well-appearing abdominal exam is benign on my repeat examination.  Final Clinical Impression(s) / ED Diagnoses Final diagnoses:  Trichomoniasis  Flank pain    Rx / DC Orders ED Discharge Orders          Ordered    metroNIDAZOLE (FLAGYL) 500 MG tablet  2 times daily        01/26/21 1408             Pati Gallo [place], [place] 01/27/21 0730    Godfrey Pick, MD 01/27/21 [phone]

## 2021-01-26 NOTE — ED Provider Notes (Signed)
Emergency Medicine Provider Triage Evaluation Note  Marie Brown , a 30 y.o. female  was evaluated in triage.  Pt complains of right flank pain for the past few days.  Endorses nausea vomiting and severe right flank pain.  States it is 8/10.  She has a history of kidney stones states it feels similar to prior.  She has not been taking any medications regularly but is occasionally taken Tylenol.  No fevers or chills no changes of bowel movements.  No urinary symptoms.  Review of Systems  Positive: Flank pain and Negative: Fever  Physical Exam  BP 131/74 (BP Location: Left Arm)   Pulse 70   Temp 98.1 F (36.7 C) (Oral)   Resp 17   Ht '5\' 3"'$  (1.6 m)   Wt 74.8 kg   SpO2 100%   BMI 29.23 kg/m  Gen:   Awake, no distress   Resp:  Normal effort  MSK:   Moves extremities without difficulty  Other:    Medical Decision Making  Medically screening exam initiated at 8:34 AM.  Appropriate orders placed.  DEAYSIA ARAGON was informed that the remainder of the evaluation will be completed by another provider, this initial triage assessment does not replace that evaluation, and the importance of remaining in the ED until their evaluation is complete.   Pati Gallo [place], [place] 01/26/21 [id number]    Godfrey Pick, MD 01/27/21 [phone]

## 2021-01-26 NOTE — ED Triage Notes (Signed)
Patient arrives with complaints of right flank pain x2 days. Reports nausea, vomiting, and some abdominal pain as well.  Patient has a history of kidney stones and the pain feels the same.  Rates pain an 8.5/10.

## 2021-01-26 NOTE — ED Notes (Signed)
RN reviewed discharge instructions with pt. Pt verbalized understanding and had no further questions. VSS upon discharge.  

## 2021-04-03 ENCOUNTER — Other Ambulatory Visit: Payer: Self-pay

## 2021-04-03 ENCOUNTER — Ambulatory Visit: Payer: Self-pay | Admitting: *Deleted

## 2021-04-03 ENCOUNTER — Encounter: Payer: Self-pay | Admitting: *Deleted

## 2021-04-03 VITALS — BP 130/77 | HR 79 | Ht 63.0 in | Wt 156.8 lb

## 2021-04-03 DIAGNOSIS — Z3201 Encounter for pregnancy test, result positive: Secondary | ICD-10-CM

## 2021-04-03 LAB — POCT URINE PREGNANCY: Preg Test, Ur: POSITIVE — AB

## 2021-04-03 NOTE — Progress Notes (Addendum)
   NURSE VISIT- PREGNANCY CONFIRMATION   SUBJECTIVE:  Marie Brown is a 30 y.o. G1P0000 female at [redacted]w[redacted]d by certain LMP of Patient's last menstrual period was 02/24/2021 (exact date). Here for pregnancy confirmation.  Home pregnancy test: positive x 3   She reports nausea.  She is taking prenatal vitamins.    OBJECTIVE:  BP 130/77 (BP Location: Right Arm, Patient Position: Sitting, Cuff Size: Normal)   Pulse 79   Ht 5\' 3"  (1.6 m)   Wt 156 lb 12.8 oz (71.1 kg)   LMP 02/24/2021 (Exact Date)   BMI 27.78 kg/m   Appears well, in no apparent distress  Results for orders placed or performed in visit on 04/03/21 (from the past 24 hour(s))  POCT urine pregnancy   Collection Time: 04/03/21  3:26 PM  Result Value Ref Range   Preg Test, Ur Positive (A) Negative    ASSESSMENT: Positive pregnancy test, [redacted]w[redacted]d by LMP    PLAN: Schedule for dating ultrasound in 2-3 weeks Prenatal vitamins: continue   Nausea medicines: requested-note routed to [facility] to send prescription   OB packet given: Yes  Janece Canterbury  04/03/2021 3:26 PM   Chart reviewed for nurse visit. Agree with plan of care.  Christin Fudge, CNM 04/03/2021 4:32 PM

## 2021-04-19 ENCOUNTER — Other Ambulatory Visit: Payer: Self-pay | Admitting: Obstetrics & Gynecology

## 2021-04-19 DIAGNOSIS — O3680X0 Pregnancy with inconclusive fetal viability, not applicable or unspecified: Secondary | ICD-10-CM

## 2021-04-22 ENCOUNTER — Other Ambulatory Visit: Payer: Self-pay

## 2021-04-22 ENCOUNTER — Encounter: Payer: Self-pay | Admitting: Obstetrics & Gynecology

## 2021-04-22 ENCOUNTER — Ambulatory Visit (INDEPENDENT_AMBULATORY_CARE_PROVIDER_SITE_OTHER): Payer: Self-pay

## 2021-04-22 ENCOUNTER — Other Ambulatory Visit: Payer: Self-pay | Admitting: Obstetrics & Gynecology

## 2021-04-22 DIAGNOSIS — O3680X0 Pregnancy with inconclusive fetal viability, not applicable or unspecified: Secondary | ICD-10-CM

## 2021-04-22 DIAGNOSIS — Z3A08 8 weeks gestation of pregnancy: Secondary | ICD-10-CM

## 2021-05-06 MED ORDER — HYDROCODONE-ACETAMINOPHEN 5-325 MG PO TABS: 1.0000 | ORAL_TABLET | Freq: Four times a day (QID) | ORAL | 0 refills | Status: DC | PRN

## 2021-05-06 NOTE — Addendum Note (Signed)
Addended by: Florian Buff on: 05/06/2021 12:01 PM   Modules accepted: Orders

## 2021-05-08 ENCOUNTER — Inpatient Hospital Stay (HOSPITAL_COMMUNITY): Payer: Medicaid Other

## 2021-05-08 ENCOUNTER — Telehealth: Payer: Self-pay | Admitting: Physician Assistant

## 2021-05-08 ENCOUNTER — Other Ambulatory Visit: Payer: Self-pay

## 2021-05-08 ENCOUNTER — Inpatient Hospital Stay (HOSPITAL_COMMUNITY)
Admission: AD | Admit: 2021-05-08 | Discharge: 2021-05-08 | Disposition: A | Discharge status: Home / Self Care | Payer: Medicaid Other | Attending: Obstetrics and Gynecology | Admitting: Obstetrics and Gynecology

## 2021-05-08 ENCOUNTER — Encounter (HOSPITAL_COMMUNITY): Payer: Self-pay | Admitting: Obstetrics and Gynecology

## 2021-05-08 DIAGNOSIS — O99331 Smoking (tobacco) complicating pregnancy, first trimester: Secondary | ICD-10-CM | POA: Insufficient documentation

## 2021-05-08 DIAGNOSIS — F17210 Nicotine dependence, cigarettes, uncomplicated: Secondary | ICD-10-CM | POA: Diagnosis not present

## 2021-05-08 DIAGNOSIS — O039 Complete or unspecified spontaneous abortion without complication: Secondary | ICD-10-CM

## 2021-05-08 DIAGNOSIS — O209 Hemorrhage in early pregnancy, unspecified: Secondary | ICD-10-CM

## 2021-05-08 DIAGNOSIS — R109 Unspecified abdominal pain: Secondary | ICD-10-CM | POA: Insufficient documentation

## 2021-05-08 DIAGNOSIS — Z6791 Unspecified blood type, Rh negative: Secondary | ICD-10-CM | POA: Diagnosis not present

## 2021-05-08 DIAGNOSIS — Z3A01 Less than 8 weeks gestation of pregnancy: Secondary | ICD-10-CM | POA: Insufficient documentation

## 2021-05-08 LAB — COMPREHENSIVE METABOLIC PANEL
ALT: 12 U/L (ref 0–44)
AST: 18 U/L (ref 15–41)
Albumin: 3.4 g/dL — ABNORMAL LOW (ref 3.5–5.0)
Alkaline Phosphatase: 38 U/L (ref 38–126)
Anion gap: 8 (ref 5–15)
BUN: 7 mg/dL (ref 6–20)
CO2: 25 mmol/L (ref 22–32)
Calcium: 8.6 mg/dL — ABNORMAL LOW (ref 8.9–10.3)
Chloride: 105 mmol/L (ref 98–111)
Creatinine, Ser: 0.75 mg/dL (ref 0.44–1.00)
GFR, Estimated: >60 mL/min (ref >60)
Glucose, Bld: 97 mg/dL (ref 70–99)
Potassium: 3.7 mmol/L (ref 3.5–5.1)
Sodium: 138 mmol/L (ref 135–145)
Total Bilirubin: 0.6 mg/dL (ref 0.3–1.2)
Total Protein: 6.3 g/dL — ABNORMAL LOW (ref 6.5–8.1)

## 2021-05-08 LAB — CBC WITH DIFFERENTIAL/PLATELET
Abs Immature Granulocytes: 0.01 10*3/uL (ref 0.00–0.07)
Basophils Absolute: 0 10*3/uL (ref 0.0–0.1)
Basophils Relative: 0 %
Eosinophils Absolute: 0 10*3/uL (ref 0.0–0.5)
Eosinophils Relative: 1 %
HCT: 35.4 % — ABNORMAL LOW (ref 36.0–46.0)
Hemoglobin: 11.5 g/dL — ABNORMAL LOW (ref 12.0–15.0)
Immature Granulocytes: 0 %
Lymphocytes Relative: 36 %
Lymphs Abs: 1.6 10*3/uL (ref 0.7–4.0)
MCH: 31.9 pg (ref 26.0–34.0)
MCHC: 32.5 g/dL (ref 30.0–36.0)
MCV: 98.3 fL (ref 80.0–100.0)
Monocytes Absolute: 0.4 10*3/uL (ref 0.1–1.0)
Monocytes Relative: 9 %
Neutro Abs: 2.4 10*3/uL (ref 1.7–7.7)
Neutrophils Relative %: 54 %
Platelets: 242 10*3/uL (ref 150–400)
RBC: 3.6 MIL/uL — ABNORMAL LOW (ref 3.87–5.11)
RDW: 13.5 % (ref 11.5–15.5)
WBC: 4.4 10*3/uL (ref 4.0–10.5)
nRBC: 0 % (ref 0.0–0.2)

## 2021-05-08 LAB — HCG, QUANTITATIVE, PREGNANCY: hCG, Beta Chain, Quant, S: 2506 m[IU]/mL — ABNORMAL HIGH (ref <5)

## 2021-05-08 LAB — ABO/RH: ABO/RH(D): B NEG

## 2021-05-08 MED ORDER — RHO D IMMUNE GLOBULIN 1500 UNIT/2ML IJ SOSY
300.0000 ug | PREFILLED_SYRINGE | Freq: Once | INTRAMUSCULAR | Status: AC
  Administered 2021-05-08: 19:00:00 300 ug via INTRAMUSCULAR
  Filled 2021-05-08: qty 2

## 2021-05-08 MED ORDER — ACETAMINOPHEN 500 MG PO TABS: 1000.0000 mg | ORAL_TABLET | Freq: Four times a day (QID) | ORAL | 0 refills | Status: DC | PRN

## 2021-05-08 MED ORDER — ACETAMINOPHEN 500 MG PO TABS
1000.0000 mg | ORAL_TABLET | Freq: Once | ORAL | Status: AC
  Administered 2021-05-08: 17:00:00 1000 mg via ORAL
  Filled 2021-05-08: qty 2

## 2021-05-08 MED ORDER — IBUPROFEN 800 MG PO TABS: 800.0000 mg | ORAL_TABLET | Freq: Three times a day (TID) | ORAL | 0 refills | Status: DC | PRN

## 2021-05-08 MED ORDER — OXYCODONE HCL 5 MG PO TABS
10.0000 mg | ORAL_TABLET | Freq: Once | ORAL | Status: AC
Start: 2021-05-08 — End: 2021-05-08
  Administered 2021-05-08: 17:00:00 10 mg via ORAL
  Filled 2021-05-08: qty 2

## 2021-05-08 MED ORDER — OXYCODONE HCL 5 MG PO TABS: 5.0000 mg | ORAL_TABLET | ORAL | 0 refills | Status: DC | PRN

## 2021-05-08 MED ORDER — MISOPROSTOL 200 MCG PO TABS: 800.0000 ug | ORAL_TABLET | Freq: Once | ORAL | 0 refills | Status: DC

## 2021-05-08 MED ORDER — PROMETHAZINE HCL 25 MG PO TABS: 25.0000 mg | ORAL_TABLET | Freq: Four times a day (QID) | ORAL | 0 refills | Status: DC | PRN

## 2021-05-08 NOTE — Discharge Instructions (Addendum)
I will [facility] a [facility] to [facility] to get you follow up.   Return to MAU: If you bleed so much that you feel like you might pass out or you do pass out If you have significant abdominal pain that is not improved with the medications we sent  If you develop a fever > 100.4

## 2021-05-08 NOTE — MAU Note (Signed)
Marie Brown is a 30 y.o. at [redacted]w[redacted]d here in MAU reporting: ongoing abdominal and back pain since Monday. States she is having period-like bleeding and is seeing some clots.   Onset of complaint: ongoing  Pain score: 10/10  Vitals:   05/08/21 1633  BP: (!) 147/83  Pulse: 77  Resp: 16  Temp: 98.2 F (36.8 C)  SpO2: 97%     Lab orders placed from triage: none

## 2021-05-08 NOTE — MAU Provider Note (Signed)
History     CSN: [id number]  Arrival date and time: 05/08/21 1606  Chief Complaint  Patient presents with   Abdominal Pain   Back Pain   Vaginal Bleeding   Marie Brown is a 30 year old G10P0 female presenting for evaluation of severe lower abdominal cramping and vaginal bleeding since Monday, however has become significantly worse since earlier today.  She had an ultrasound done at her OB/GYN on 12/10 showing an intrauterine gestational sac without a fetal pole or yolk sac + without heart rate documented (about 2 weeks smaller in size compared to LMP), suggestive of early pregnancy or possible miscarriage.  She had spoken with Dr. Elonda Husky earlier this week and has been using Norco for pain relief.  She took 2 tablets several hours earlier today and has had no relief.  Vaginal bleeding feels slightly more than a normal period, having dime sized clots without tissue similar to products of conception.  Abdominal pain is crampy and stabbing in nature, also present in her lower back.  Denies any associated fever, abnormal discharge, constipation/diarrhea. Feeling a little nauseous due to the pain.    Past Medical History:  Diagnosis Date   Bipolar 2 disorder ([place])    Carpal tunnel syndrome    Kidney stones    Migraine     Past Surgical History:  Procedure Laterality Date   APPENDECTOMY     FOOT SURGERY Bilateral 2005    Family History  Problem Relation Age of Onset   Hypertension Mother    Diabetes Other    Thyroid disease Other    Heart disease Other    Birth defects Other    Mental illness Other    Stroke Other    Breast cancer Other    Graves' disease Maternal Grandmother    Multiple sclerosis Maternal Grandfather    Suicidality Father     Social History   Tobacco Use   Smoking status: Every Day    Packs/day: 0.50    Years: 3.00    Pack years: 1.50    Types: Cigarettes    Last attempt to quit: 04/18/2016    Years since quitting: 5.0   Smokeless tobacco: Never  Vaping  Use   Vaping Use: Never used  Substance Use Topics   Alcohol use: Yes    Comment: occ.   Drug use: No    Allergies: No Known Allergies  No medications prior to admission.    Review of Systems  Constitutional:  Negative for chills, fatigue and fever.  Respiratory:  Negative for chest tightness and shortness of breath.   Cardiovascular:  Negative for chest pain.  Gastrointestinal:  Positive for abdominal pain and nausea. Negative for abdominal distention, constipation, diarrhea and vomiting.  Genitourinary:  Positive for vaginal bleeding. Negative for decreased urine volume, dysuria, flank pain, frequency and vaginal discharge.  Musculoskeletal:  Positive for back pain.  Skin:  Negative for pallor.  Neurological:  Negative for dizziness, light-headedness and headaches.  Physical Exam   Blood pressure 134/72, pulse 78, temperature 98.2 F (36.8 C), temperature source Oral, resp. rate 16, height 5\' 3"  (1.6 m), weight 73.4 kg, last menstrual period 02/24/2021, SpO2 97 %.  Physical Exam Constitutional:      Appearance: She is well-developed.     Comments: Tearful and appears uncomfortable  HENT:     Head: Normocephalic and atraumatic.     Mouth/Throat:     Mouth: Mucous membranes are moist.  Cardiovascular:     Rate and Rhythm: Normal rate.  Heart sounds: Normal heart sounds.  Pulmonary:     Effort: Pulmonary effort is normal.  Abdominal:     Comments: Soft, nondistended, generally tender in lower quadrant/suprapubic region without rebounding or guarding.  Genitourinary:    Comments: Pelvic exam: VULVA: normal appearing vulva with no masses, tenderness or lesions, VAGINA: normal appearing vagina with normal color and discharge, no lesions, small amount of vaginal bleeding pooling within canal. CERVIX: normal appearing cervix, small amount of dried blood present at cervical os, removed with a Fox swab.  No active bleeding or products of conception noted. Skin:    Capillary  Refill: Capillary refill takes less than 2 seconds.  Neurological:     Mental Status: She is alert.  Psychiatric:        Behavior: Behavior normal.    MAU Course   MDM Oxycodone and tylenol  OB ultrasound ABO/Rh CBC Beta-hCG quant  Reassessed: Feeling better after receiving oxycodone and Tylenol, still having some cramping.  Reviewed ultrasound results with patient, consistent with miscarriage and retained products.  Patient had been expecting this result, reports she is handling it as best she can.  Provided supportive listening, close friend at bedside for support as well.  Assessment and Plan   1. Vaginal bleeding affecting early pregnancy 2. Miscarriage 3. Rh Negative Status   U/S with no intrauterine gestational sac and heterogeneously thickened endometrium concerning for retained products of conception. Hgb 13 > 11.5 today.  Hemodynamically stable.  Discussed observation vs Cytotec, opted to proceed with Cytotec.  Rx'd Cytotec 800 mg per Dr. Rip Harbour.  RhoGAM given, B- blood type.  Rx'd oxycodone, Tylenol, and ibuprofen for pain relief.  Rx'd Phenergan for nausea as needed.  Sent a message to [facility] to have an appointment for follow-up next week if possible.   Strict MAU precautions were discussed, including but not limited to worsening of vaginal bleeding associated with lightheadedness/dizziness/weakness or intolerable abdominal pain despite medication.  Otherwise follow-up with OB/GYN as above.  Marie Brown 05/08/2021, 8:56 PM

## 2021-05-09 LAB — RH IG WORKUP (INCLUDES ABO/RH)
Antibody Screen: NEGATIVE
Gestational Age(Wks): 10
Unit division: 0

## 2021-05-14 ENCOUNTER — Encounter: Payer: Self-pay | Admitting: *Deleted

## 2021-05-22 ENCOUNTER — Emergency Department (HOSPITAL_COMMUNITY)
Admission: EM | Admit: 2021-05-22 | Discharge: 2021-05-22 | Disposition: A | Discharge status: Home / Self Care | Payer: Medicaid Other | Attending: Emergency Medicine | Admitting: Emergency Medicine

## 2021-05-22 ENCOUNTER — Emergency Department (HOSPITAL_COMMUNITY): Payer: Medicaid Other

## 2021-05-22 ENCOUNTER — Other Ambulatory Visit: Payer: Self-pay

## 2021-05-22 ENCOUNTER — Encounter (HOSPITAL_COMMUNITY): Payer: Self-pay | Admitting: *Deleted

## 2021-05-22 DIAGNOSIS — M25571 Pain in right ankle and joints of right foot: Secondary | ICD-10-CM | POA: Diagnosis not present

## 2021-05-22 DIAGNOSIS — S62102A Fracture of unspecified carpal bone, left wrist, initial encounter for closed fracture: Secondary | ICD-10-CM

## 2021-05-22 DIAGNOSIS — S6992XA Unspecified injury of left wrist, hand and finger(s), initial encounter: Secondary | ICD-10-CM | POA: Diagnosis present

## 2021-05-22 DIAGNOSIS — Y92410 Unspecified street and highway as the place of occurrence of the external cause: Secondary | ICD-10-CM | POA: Insufficient documentation

## 2021-05-22 DIAGNOSIS — S6292XA Unspecified fracture of left wrist and hand, initial encounter for closed fracture: Secondary | ICD-10-CM | POA: Diagnosis not present

## 2021-05-22 DIAGNOSIS — R202 Paresthesia of skin: Secondary | ICD-10-CM | POA: Diagnosis not present

## 2021-05-22 DIAGNOSIS — S8001XA Contusion of right knee, initial encounter: Secondary | ICD-10-CM | POA: Insufficient documentation

## 2021-05-22 DIAGNOSIS — S52502A Unspecified fracture of the lower end of left radius, initial encounter for closed fracture: Secondary | ICD-10-CM | POA: Diagnosis not present

## 2021-05-22 MED ORDER — NAPROXEN 375 MG PO TABS
375.0000 mg | ORAL_TABLET | Freq: Two times a day (BID) | ORAL | 0 refills | Status: DC
Start: 2021-05-22 — End: 2021-10-26
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
## 2021-05-22 NOTE — Discharge Instructions (Addendum)

## 2021-05-27 ENCOUNTER — Telehealth: Payer: Self-pay | Admitting: Orthopedic Surgery

## 2021-05-27 ENCOUNTER — Other Ambulatory Visit: Payer: Self-pay

## 2021-05-27 DIAGNOSIS — O039 Complete or unspecified spontaneous abortion without complication: Secondary | ICD-10-CM
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
## 2021-05-27 NOTE — Telephone Encounter (Signed)
Called patient; aware of appointment.

## 2021-05-29 ENCOUNTER — Encounter: Payer: Self-pay | Admitting: Orthopedic Surgery

## 2021-05-29 ENCOUNTER — Ambulatory Visit (INDEPENDENT_AMBULATORY_CARE_PROVIDER_SITE_OTHER): Payer: Self-pay | Admitting: Orthopedic Surgery

## 2021-05-29 ENCOUNTER — Other Ambulatory Visit: Payer: Self-pay

## 2021-05-29 ENCOUNTER — Other Ambulatory Visit: Payer: Self-pay | Admitting: Orthopedic Surgery

## 2021-05-29 VITALS — BP 135/80 | HR 77 | Ht 63.0 in | Wt 161.0 lb

## 2021-05-29 DIAGNOSIS — S52542A Smith's fracture of left radius, initial encounter for closed fracture: Secondary | ICD-10-CM

## 2021-05-29 MED ORDER — IBUPROFEN 800 MG PO TABS: 800.0000 mg | ORAL_TABLET | Freq: Three times a day (TID) | ORAL | 0 refills | Status: DC | PRN
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
## 2021-06-10 ENCOUNTER — Encounter: Payer: Self-pay | Admitting: Orthopedic Surgery

## 2021-06-12 ENCOUNTER — Ambulatory Visit (INDEPENDENT_AMBULATORY_CARE_PROVIDER_SITE_OTHER): Payer: Medicaid Other | Admitting: Orthopedic Surgery

## 2021-06-12 ENCOUNTER — Encounter: Payer: Self-pay | Admitting: Orthopedic Surgery

## 2021-06-12 ENCOUNTER — Other Ambulatory Visit: Payer: Self-pay

## 2021-06-12 VITALS — Ht 63.0 in | Wt 161.0 lb

## 2021-06-12 DIAGNOSIS — Z4789 Encounter for other orthopedic aftercare: Secondary | ICD-10-CM

## 2021-06-12 DIAGNOSIS — S52542D Smith's fracture of left radius, subsequent encounter for closed fracture with routine healing: Secondary | ICD-10-CM | POA: Diagnosis not present

## 2021-06-12 MED ORDER — HYDROCODONE-ACETAMINOPHEN 5-325 MG PO TABS: 1.0000 | ORAL_TABLET | Freq: Three times a day (TID) | ORAL | 0 refills | Status: AC | PRN
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
## 2021-06-12 NOTE — Patient Instructions (Signed)
Please give another work note patient will have the dates out of work note should extend until the next office visit

## 2021-06-26 DIAGNOSIS — S52502D Unspecified fracture of the lower end of left radius, subsequent encounter for closed fracture with routine healing: Secondary | ICD-10-CM | POA: Insufficient documentation

## 2021-06-27 ENCOUNTER — Ambulatory Visit: Payer: Medicaid Other

## 2021-06-27 ENCOUNTER — Encounter: Payer: Self-pay | Admitting: Orthopedic Surgery

## 2021-06-27 ENCOUNTER — Other Ambulatory Visit: Payer: Self-pay

## 2021-06-27 ENCOUNTER — Ambulatory Visit (INDEPENDENT_AMBULATORY_CARE_PROVIDER_SITE_OTHER): Payer: Medicaid Other | Admitting: Orthopedic Surgery

## 2021-06-27 DIAGNOSIS — S52542D Smith's fracture of left radius, subsequent encounter for closed fracture with routine healing: Secondary | ICD-10-CM

## 2021-06-27 MED ORDER — HYDROCODONE-ACETAMINOPHEN 5-325 MG PO TABS: 1.0000 | ORAL_TABLET | Freq: Three times a day (TID) | ORAL | 0 refills | Status: AC | PRN
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
## 2021-07-22 ENCOUNTER — Ambulatory Visit (INDEPENDENT_AMBULATORY_CARE_PROVIDER_SITE_OTHER): Payer: Medicaid Other | Admitting: Orthopedic Surgery

## 2021-07-22 ENCOUNTER — Other Ambulatory Visit: Payer: Self-pay

## 2021-07-22 ENCOUNTER — Ambulatory Visit: Payer: Medicaid Other

## 2021-07-22 DIAGNOSIS — S52542D Smith's fracture of left radius, subsequent encounter for closed fracture with routine healing: Secondary | ICD-10-CM | POA: Diagnosis not present
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
## 2021-07-25 ENCOUNTER — Encounter: Payer: Medicaid Other | Admitting: Orthopedic Surgery

## 2021-08-24 DIAGNOSIS — S01511A Laceration without foreign body of lip, initial encounter: Secondary | ICD-10-CM | POA: Diagnosis not present

## 2021-08-24 DIAGNOSIS — R55 Syncope and collapse: Secondary | ICD-10-CM | POA: Diagnosis not present

## 2021-08-24 DIAGNOSIS — R296 Repeated falls: Secondary | ICD-10-CM | POA: Diagnosis not present

## 2021-08-24 DIAGNOSIS — D320 Benign neoplasm of cerebral meninges: Secondary | ICD-10-CM | POA: Diagnosis not present

## 2021-08-24 DIAGNOSIS — G939 Disorder of brain, unspecified: Secondary | ICD-10-CM | POA: Diagnosis not present

## 2021-08-25 DIAGNOSIS — G939 Disorder of brain, unspecified: Secondary | ICD-10-CM | POA: Diagnosis not present

## 2021-08-25 DIAGNOSIS — R55 Syncope and collapse: Secondary | ICD-10-CM | POA: Diagnosis not present

## 2021-08-27 ENCOUNTER — Telehealth: Payer: Medicaid Other | Admitting: Nurse Practitioner

## 2021-08-27 DIAGNOSIS — K130 Diseases of lips: Secondary | ICD-10-CM

## 2021-08-27 MED ORDER — SULFAMETHOXAZOLE-TRIMETHOPRIM 800-160 MG PO TABS: 1.0000 | ORAL_TABLET | Freq: Two times a day (BID) | ORAL | 0 refills | Status: DC

## 2021-08-27 NOTE — Progress Notes (Signed)
? ?Virtual Visit Consent  ? ?Marie Brown, you are scheduled for a virtual visit with Mary-Margaret Hassell Done, FNP, a [facility] provider, today.   ?  ?Just as with appointments in the office, your consent must be obtained to participate.  Your consent will be active for this visit and any virtual visit you may have with one of our providers in the next 365 days.   ?  ?If you have a MyChart account, a copy of this consent can be sent to you electronically.  All virtual visits are billed to your insurance company just like a traditional visit in the office.   ? ?As this is a virtual visit, video technology does not allow for your provider to perform a traditional examination.  This may limit your provider's ability to fully assess your condition.  If your provider identifies any concerns that need to be evaluated in person or the need to arrange testing (such as labs, EKG, etc.), we will make arrangements to do so.   ?  ?Although advances in technology are sophisticated, we cannot ensure that it will always work on either your end or our end.  If the connection with a video visit is poor, the visit may have to be switched to a telephone visit.  With either a video or telephone visit, we are not always able to ensure that we have a secure connection.    ? ?I need to obtain your verbal consent now.   Are you willing to proceed with your visit today? YES ?  ?Marie Brown has provided verbal consent on 08/27/2021 for a virtual visit (video or telephone). ?  ?Mary-Margaret Hassell Done, FNP  ? ?Date: 08/27/2021 5:45 PM ? ? ?Virtual Visit via Video Note  ? ?I, Mary-Margaret Hassell Done, connected with Marie Brown ([id number], Aug 13, 1990) on 08/27/21 at  6:00 PM EDT by a video-enabled telemedicine application and verified that I am speaking with the correct person using two identifiers. ? ?Location: ?Patient: Virtual Visit Location Patient: Home ?Provider: Virtual Visit Location Provider: Mobile ?  ?I discussed the  limitations of evaluation and management by telemedicine and the availability of in person appointments. The patient expressed understanding and agreed to proceed.   ? ?History of Present Illness: ?Marie Brown is a 31 y.o. who identifies as a female who was assigned female at birth, and is being seen today for cellulitis ?. ? ?HPI: Patient had a seizure at work and bit her lip during the seizure. She went to [facility] and they stitched her lip. Now it it has a yellowish smelly drainage.  ?  ?Review of Systems  ?Constitutional:  Negative for diaphoresis and weight loss.  ?Eyes:  Negative for blurred vision, double vision and pain.  ?Respiratory:  Negative for shortness of breath.   ?Cardiovascular:  Negative for chest pain, palpitations, orthopnea and leg swelling.  ?Gastrointestinal:  Negative for abdominal pain.  ?Skin:  Negative for rash.  ?Neurological:  Negative for dizziness, sensory change, loss of consciousness, weakness and headaches.  ?Endo/Heme/Allergies:  Negative for polydipsia. Does not bruise/bleed easily.  ?Psychiatric/Behavioral:  Negative for memory loss. The patient does not have insomnia.   ?All other systems reviewed and are negative. ? ?Problems:  ?Patient Active Problem List  ? Diagnosis Date Noted  ? Closed fracture of lower end of left radius with routine healing 06/26/2021  ? Dysmenorrhea 11/09/2012  ? ACUTE BRONCHOSPASM 12/12/2009  ? NECK PAIN 06/13/2009  ? NAUSEA AND VOMITING 05/24/2009  ? ACUTE  LARYNGITIS, WITHOUT MENTION OF OBSTRUCTIO 05/02/2009  ? WEIGHT GAIN 04/22/2009  ? HEADACHE 04/22/2009  ? UNSPECIFIED VISUAL LOSS 01/17/2009  ? ACUTE CYSTITIS 01/17/2009  ? Vaginitis and vulvovaginitis, unspecified 01/17/2009  ? FATIGUE 01/17/2009  ? ACUTE SINUSITIS, UNSPECIFIED 11/08/2008  ? ACUTE BRONCHITIS 11/08/2008  ? BIPOLAR DISORDER UNSPECIFIED 08/12/2008  ? NICOTINE ADDICTION 08/12/2008  ?  ?Allergies: No Known Allergies ?Medications:  ?Current Outpatient Medications:  ?  acetaminophen  (TYLENOL) 500 MG tablet, Take 2 tablets (1,000 mg total) by mouth every 6 (six) hours as needed. Max 4,'000mg'$  daily, Disp: 30 tablet, Rfl: 0 ?  ibuprofen (ADVIL) 800 MG tablet, Take 1 tablet (800 mg total) by mouth every 8 (eight) hours as needed., Disp: 90 tablet, Rfl: 0 ?  misoprostol (CYTOTEC) 200 MCG tablet, Take 4 tablets (800 mcg total) by mouth once for 1 dose., Disp: 4 tablet, Rfl: 0 ?  naproxen (NAPROSYN) 375 MG tablet, Take 1 tablet (375 mg total) by mouth 2 (two) times daily with a meal., Disp: 20 tablet, Rfl: 0 ?  oxyCODONE (ROXICODONE) 5 MG immediate release tablet, Take 1-2 tablets (5-10 mg total) by mouth every 4 (four) hours as needed for severe pain., Disp: 20 tablet, Rfl: 0 ?  promethazine (PHENERGAN) 25 MG tablet, Take 1 tablet (25 mg total) by mouth every 6 (six) hours as needed for nausea or vomiting. (Patient not taking: Reported on 06/27/2021), Disp: 20 tablet, Rfl: 0 ? ?Observations/Objective: ?Patient is well-developed, well-nourished in no acute distress.  ?Resting comfortably  at home.  ?Head is normocephalic, atraumatic.  ?No labored breathing.  ?Speech is clear and coherent with logical content.  ?Patient is alert and oriented at baseline.  ?Lower lip edema with yellowish excudate ? ?Assessment and Plan: ? ?Marie Brown in today with chief complaint of Cellulitis ? ? ?1. Cellulitis of lip ?Warm compresses ?Do not pick or scratch ? ?Meds ordered this encounter  ?Medications  ? sulfamethoxazole-trimethoprim (BACTRIM DS) 800-160 MG tablet  ?  Sig: Take 1 tablet by mouth 2 (two) times daily.  ?  Dispense:  20 tablet  ?  Refill:  0  ?  Order Specific Question:   Supervising Provider  ?  Answer:   Noemi Chapel [[id number]]  ? ? ? ? ? ? ?Follow Up Instructions: ?I discussed the assessment and treatment plan with the patient. The patient was provided an opportunity to ask questions and all were answered. The patient agreed with the plan and demonstrated an understanding of the instructions.  A copy  of instructions were sent to the patient via MyChart. ? ?The patient was advised to call back or seek an in-person evaluation if the symptoms worsen or if the condition fails to improve as anticipated. ? ?Time:  ?I spent 7 minutes with the patient via telehealth technology discussing the above problems/concerns.   ? ?Mary-Margaret Hassell Done, FNP ? ?

## 2021-08-30 ENCOUNTER — Telehealth: Payer: Medicaid Other | Admitting: Physician Assistant

## 2021-08-30 DIAGNOSIS — Z5189 Encounter for other specified aftercare: Secondary | ICD-10-CM | POA: Diagnosis not present

## 2021-08-30 NOTE — Patient Instructions (Addendum)
?Arcola Jansky, thank you for joining Mar Daring, PA-C for today's virtual visit.  While this provider is not your primary care provider (PCP), if your PCP is located in our provider database this encounter information will be shared with them immediately following your visit. ? ?Consent: ?(Patient) Marie Brown provided verbal consent for this virtual visit at the beginning of the encounter. ? ?Current Medications: ? ?Current Outpatient Medications:  ?  acetaminophen (TYLENOL) 500 MG tablet, Take 2 tablets (1,000 mg total) by mouth every 6 (six) hours as needed. Max 4,'000mg'$  daily, Disp: 30 tablet, Rfl: 0 ?  ibuprofen (ADVIL) 800 MG tablet, Take 1 tablet (800 mg total) by mouth every 8 (eight) hours as needed., Disp: 90 tablet, Rfl: 0 ?  misoprostol (CYTOTEC) 200 MCG tablet, Take 4 tablets (800 mcg total) by mouth once for 1 dose., Disp: 4 tablet, Rfl: 0 ?  naproxen (NAPROSYN) 375 MG tablet, Take 1 tablet (375 mg total) by mouth 2 (two) times daily with a meal., Disp: 20 tablet, Rfl: 0 ?  oxyCODONE (ROXICODONE) 5 MG immediate release tablet, Take 1-2 tablets (5-10 mg total) by mouth every 4 (four) hours as needed for severe pain., Disp: 20 tablet, Rfl: 0 ?  promethazine (PHENERGAN) 25 MG tablet, Take 1 tablet (25 mg total) by mouth every 6 (six) hours as needed for nausea or vomiting. (Patient not taking: Reported on 06/27/2021), Disp: 20 tablet, Rfl: 0 ?  sulfamethoxazole-trimethoprim (BACTRIM DS) 800-160 MG tablet, Take 1 tablet by mouth 2 (two) times daily., Disp: 20 tablet, Rfl: 0  ? ?Medications ordered in this encounter:  ?No orders of the defined types were placed in this encounter. ?  ? ?*If you need refills on other medications prior to your next appointment, please contact your pharmacy* ? ?Follow-Up: ?Call back or seek an in-person evaluation if the symptoms worsen or if the condition fails to improve as anticipated. ? ?Other Instructions ? ?Mouth Laceration ?A mouth laceration is a deep  cut in the lining of your mouth (mucosa). The laceration may extend into your lip or go through your mouth and cheek. Lacerations inside your mouth may involve your tongue, the insides of your cheeks, or the upper surface of your mouth (palate). Mouth lacerations may bleed a lot because your mouth has a very rich blood supply. ?What are the causes? ?This injury is most often caused by your teeth cutting the lining of your mouth. It may also be caused by any injury that affects your face. ?What are the signs or symptoms? ?The most common symptom of a mouth laceration is bleeding. Other symptoms include: ?Pain. ?Feeling a hole or tear in your mouth. ?How is this diagnosed? ?This condition may be diagnosed with a physical exam of your mouth. Your health care provider may: ?Rinse your mouth with a saline solution. ?Remove any blood clots to determine how bad your injury is. ?Take X-rays to rule out injuries to the teeth, face, or jaw. ?How is this treated? ?Treatment depends on the location and severity of your injury. Small mouth lacerations may not need treatment if bleeding has stopped. You may need sutures if your tongue or mouth laceration is large or deep or if it continues to bleed. ?If sutures are necessary, your health care provider may use absorbable sutures that dissolve as your body heals. You may also receive antibiotic medicine or a tetanus shot. ?Follow these instructions at home: ?Medicines ?Take over-the-counter and prescription medicines only as told by your health  care provider. ?If you were prescribed an antibiotic medicine, take or apply it as told by your health care provider. Do not stop using the antibiotic even if you start to feel better. ?Ask your health care provider if the medicine prescribed to you requires you to avoid driving or using machinery. ?Eating and drinking ? ?Eat a soft diet. ?Avoid hot foods and hot liquids for a few days as told by your health care provider. ?Rinse your mouth  after eating. ?Wound care ?It is normal to have a white or gray patch over your wound while it heals. ?Check your wound every day for signs of infection. Check for: ?Redness, swelling, or pain. ?Fluid or blood. ?Warmth. ?Pus or a bad smell. ?Gently gargle and rinse your mouth 4 to 6 times a day. Spit out the liquid. Do not swallow. ?Use the rinse solution recommended by your health care provider, which may include: ?Antibacterial rinse (chlorhexidine). ?Saline rinse. ?Do not poke the sutures with your tongue. Doing that can loosen them. ?If you have a lip laceration: ?Keep the wound completely dry for the first 24 hours, or as told by your health care provider. After that time, you may shower or bathe. Do not soak in water until after the sutures have been removed. ?If you were given a bandage (dressing), you should change it at least once a day, or as told by your health care provider. You should also change it if it becomes wet or dirty. ?Clean the wound once each day, or as told by your health care provider. ?After cleaning the wound, apply a thin layer of antibiotic ointment as told by your health care provider. This can help prevent infection and keep the dressing from sticking to the wound. ?General instructions ? ?Maintain regular oral hygiene. Gently brush your teeth with a soft, nylon-bristled toothbrush 2 times per day. ?Do not use any products that contain nicotine or tobacco. These products include cigarettes, chewing tobacco, and vaping devices, such as e-cigarettes. If you need help quitting, ask your health care provider. ?Keep all follow-up visits. This is important. ?Contact a health care provider if: ?Your pain is not controlled with medicine. ?You have: ?A fever or chills. ?Redness, swelling, or pain on your wound, and it is getting worse. ?Fresh bleeding or pus coming from your wound. ?Swollen or tender glands in your throat. ?Get help right away if: ?The edges of your wound break open. ?Your  face or the area under your jaw becomes swollen. ?You have trouble breathing or swallowing. ?These symptoms may be an emergency. Get help right away. Call 911. ?Do not wait to see if the symptoms will go away. ?Do not drive yourself to the hospital. ?Summary ?A mouth laceration is a deep cut in the lining of your mouth. ?Mouth lacerations may bleed a lot because your mouth has a very rich blood supply. ?While healing from a mouth laceration, check your wound every day for signs of infection. ?Contact a health care provider if you experience fresh bleeding or you notice that pus is coming out of your wound. ?This information is not intended to replace advice given to you by your health care provider. Make sure you discuss any questions you have with your health care provider. ?Document Revised: 11/08/2020 Document Reviewed: 11/08/2020 ?Elsevier Patient Education ? [employer]. ? ? ?Laceration Care, Adult ?A laceration is a cut that may go through all layers of the skin. The cut may also go into the tissue that is  right under the skin. Some cuts heal on their own. Other cuts need to be closed with stitches (sutures), staples, skin adhesive strips, or skin glue. Taking care of your cut lowers your risk of infection, helps your injury heal better, and may prevent scarring. ?General tips ?Keep your wound clean and dry. ?Do not scratch or pick at your wound. ?Wash your hands with soap and water for at least 20 seconds before and after touching your wound or changing your bandage (dressing). If you cannot use soap and water, use hand sanitizer. ?Do not usedisinfectants or antiseptics, such as rubbing alcohol, to clean your wound unless told by your doctor. ?If you were given a bandage, change it at least once a day, or as told by your doctor. You should also change it if it gets wet or dirty. ?How to take care of your cut ?If your doctor used stitches or staples: ?Keep the wound fully dry for the first 24 hours, or  as told by your doctor. After that, you may take a shower or a bath. Do not soak the wound in water until after the stitches or staples have been taken out. ?Clean the wound once a day, or as told by your d

## 2021-08-30 NOTE — Progress Notes (Signed)
?Virtual Visit Consent  ? ?Arcola Jansky, you are scheduled for a virtual visit with a [facility] provider today.   ?  ?Just as with appointments in the office, your consent must be obtained to participate.  Your consent will be active for this visit and any virtual visit you may have with one of our providers in the next 365 days.   ?  ?If you have a MyChart account, a copy of this consent can be sent to you electronically.  All virtual visits are billed to your insurance company just like a traditional visit in the office.   ? ?As this is a virtual visit, video technology does not allow for your provider to perform a traditional examination.  This may limit your provider's ability to fully assess your condition.  If your provider identifies any concerns that need to be evaluated in person or the need to arrange testing (such as labs, EKG, etc.), we will make arrangements to do so.   ?  ?Although advances in technology are sophisticated, we cannot ensure that it will always work on either your end or our end.  If the connection with a video visit is poor, the visit may have to be switched to a telephone visit.  With either a video or telephone visit, we are not always able to ensure that we have a secure connection.    ? ?I need to obtain your verbal consent now.   Are you willing to proceed with your visit today?  ?  ?MICHAELIA BEILFUSS has provided verbal consent on 08/30/2021 for a virtual visit (video or telephone). ?  ?Mar Daring, PA-C  ? ?Date: 08/30/2021 5:25 PM ? ? ?Virtual Visit via Video Note  ? ?Reynolds Bowl, connected with  ORABELLE RYLEE  ([id number], [birth year]) on 08/30/21 at  5:15 PM EDT by a video-enabled telemedicine application and verified that I am speaking with the correct person using two identifiers. ? ?Location: ?Patient: Virtual Visit Location Patient: Home ?Provider: Virtual Visit Location Provider: Home Office ?  ?I discussed the limitations of evaluation and  management by telemedicine and the availability of in person appointments. The patient expressed understanding and agreed to proceed.   ? ?History of Present Illness: ?Marie Brown is a 31 y.o. who identifies as a female who was assigned female at birth, and is being seen today for recheck lip wound. Was seen virtually on 08/27/21 for possible infection of the lip laceration. Lip laceration occurred during a syncopal episode on 08/24/21. She was placed on Bactrim on 08/27/21. She reports the lip is slowly improving but does have a darkened area that she is concerned about.  ? ? ?Problems:  ?Patient Active Problem List  ? Diagnosis Date Noted  ? Closed fracture of lower end of left radius with routine healing 06/26/2021  ? Dysmenorrhea 11/09/2012  ? ACUTE BRONCHOSPASM 12/12/2009  ? NECK PAIN 06/13/2009  ? NAUSEA AND VOMITING 05/24/2009  ? ACUTE LARYNGITIS, WITHOUT MENTION OF OBSTRUCTIO 05/02/2009  ? WEIGHT GAIN 04/22/2009  ? HEADACHE 04/22/2009  ? UNSPECIFIED VISUAL LOSS 01/17/2009  ? ACUTE CYSTITIS 01/17/2009  ? Vaginitis and vulvovaginitis, unspecified 01/17/2009  ? FATIGUE 01/17/2009  ? ACUTE SINUSITIS, UNSPECIFIED 11/08/2008  ? ACUTE BRONCHITIS 11/08/2008  ? BIPOLAR DISORDER UNSPECIFIED 08/12/2008  ? NICOTINE ADDICTION 08/12/2008  ?  ?Allergies: No Known Allergies ?Medications:  ?Current Outpatient Medications:  ?  acetaminophen (TYLENOL) 500 MG tablet, Take 2 tablets (1,000 mg total) by mouth every  6 (six) hours as needed. Max 4,'000mg'$  daily, Disp: 30 tablet, Rfl: 0 ?  ibuprofen (ADVIL) 800 MG tablet, Take 1 tablet (800 mg total) by mouth every 8 (eight) hours as needed., Disp: 90 tablet, Rfl: 0 ?  misoprostol (CYTOTEC) 200 MCG tablet, Take 4 tablets (800 mcg total) by mouth once for 1 dose., Disp: 4 tablet, Rfl: 0 ?  naproxen (NAPROSYN) 375 MG tablet, Take 1 tablet (375 mg total) by mouth 2 (two) times daily with a meal., Disp: 20 tablet, Rfl: 0 ?  oxyCODONE (ROXICODONE) 5 MG immediate release tablet, Take  1-2 tablets (5-10 mg total) by mouth every 4 (four) hours as needed for severe pain., Disp: 20 tablet, Rfl: 0 ?  promethazine (PHENERGAN) 25 MG tablet, Take 1 tablet (25 mg total) by mouth every 6 (six) hours as needed for nausea or vomiting. (Patient not taking: Reported on 06/27/2021), Disp: 20 tablet, Rfl: 0 ?  sulfamethoxazole-trimethoprim (BACTRIM DS) 800-160 MG tablet, Take 1 tablet by mouth 2 (two) times daily., Disp: 20 tablet, Rfl: 0 ? ?Observations/Objective: ?Patient is well-developed, well-nourished in no acute distress.  ?Resting comfortably at home.  ?Head is normocephalic ?No labored breathing.  ?Speech is clear and coherent with logical content.  ?Patient is alert and oriented at baseline.  ?Left lower lip is swollen with a laceration noted. Darkened area centrally appears to be eschar type tissue vs bruising or both (difficult to differentiate through video); Pink tissue surrounding. No drainage noted today. Patient reports wound edges have approximated and wound is closing as expected ? ?Assessment and Plan: ?1. Encounter for wound re-check ? ?- Encouraged patient wound appears to be healing appropriately ?- Continue Bactrim ?- Salt water gargles are ok ?- Do not apply anything topically to wound ?- Seek in person evaluation if worsening or signs of infection (fever, chills, nausea, vomiting) occur ? ?Follow Up Instructions: ?I discussed the assessment and treatment plan with the patient. The patient was provided an opportunity to ask questions and all were answered. The patient agreed with the plan and demonstrated an understanding of the instructions.  A copy of instructions were sent to the patient via MyChart unless otherwise noted below.  ? ? ?The patient was advised to call back or seek an in-person evaluation if the symptoms worsen or if the condition fails to improve as anticipated. ? ?Time:  ?I spent 8 minutes with the patient via telehealth technology discussing the above problems/concerns.    ? ?Mar Daring, PA-C ?

## 2021-10-17 DIAGNOSIS — D1800 Hemangioma unspecified site: Secondary | ICD-10-CM | POA: Insufficient documentation

## 2021-10-17 DIAGNOSIS — Z8349 Family history of other endocrine, nutritional and metabolic diseases: Secondary | ICD-10-CM | POA: Insufficient documentation

## 2021-10-17 DIAGNOSIS — R5383 Other fatigue: Secondary | ICD-10-CM | POA: Diagnosis not present

## 2021-10-17 DIAGNOSIS — D649 Anemia, unspecified: Secondary | ICD-10-CM | POA: Diagnosis not present

## 2021-10-17 DIAGNOSIS — F17200 Nicotine dependence, unspecified, uncomplicated: Secondary | ICD-10-CM | POA: Insufficient documentation

## 2021-10-26 ENCOUNTER — Telehealth: Payer: Medicaid Other | Admitting: Nurse Practitioner

## 2021-10-26 DIAGNOSIS — R112 Nausea with vomiting, unspecified: Secondary | ICD-10-CM | POA: Diagnosis not present

## 2021-10-26 DIAGNOSIS — G43109 Migraine with aura, not intractable, without status migrainosus: Secondary | ICD-10-CM | POA: Diagnosis not present

## 2021-10-26 MED ORDER — NAPROXEN 500 MG PO TABS: 500.0000 mg | ORAL_TABLET | Freq: Two times a day (BID) | ORAL | 1 refills | Status: DC

## 2021-10-26 MED ORDER — ONDANSETRON HCL 4 MG PO TABS: 4.0000 mg | ORAL_TABLET | Freq: Three times a day (TID) | ORAL | 0 refills | Status: DC | PRN

## 2021-10-26 NOTE — Progress Notes (Signed)
Virtual Visit Consent   Marie Brown, you are scheduled for a virtual visit with Mary-Margaret Hassell Done, [place], a [facility] provider, today.     Just as with appointments in the office, your consent must be obtained to participate.  Your consent will be active for this visit and any virtual visit you may have with one of our providers in the next 365 days.     If you have a MyChart account, a copy of this consent can be sent to you electronically.  All virtual visits are billed to your insurance company just like a traditional visit in the office.    As this is a virtual visit, video technology does not allow for your provider to perform a traditional examination.  This may limit your provider's ability to fully assess your condition.  If your provider identifies any concerns that need to be evaluated in person or the need to arrange testing (such as labs, EKG, etc.), we will make arrangements to do so.     Although advances in technology are sophisticated, we cannot ensure that it will always work on either your end or our end.  If the connection with a video visit is poor, the visit may have to be switched to a telephone visit.  With either a video or telephone visit, we are not always able to ensure that we have a secure connection.     I need to obtain your verbal consent now.   Are you willing to proceed with your visit today? YES   Marie Brown has provided verbal consent on 10/26/2021 for a virtual visit (video or telephone).   Mary-Margaret Hassell Done, FNP   Date: 10/26/2021 2:30 PM   Virtual Visit via Video Note   I, Mary-Margaret Hassell Done, connected with Marie Brown ([id number], 03/04/1991) on 10/26/21 at  2:30 PM EDT by a video-enabled telemedicine application and verified that I am speaking with the correct person using two identifiers.  Location: Patient: Virtual Visit Location Patient: Home Provider: Virtual Visit Location Provider: Mobile   I discussed the  limitations of evaluation and management by telemedicine and the availability of in person appointments. The patient expressed understanding and agreed to proceed.    History of Present Illness: Marie Brown is a 31 y.o. who identifies as a female who was assigned female at birth, and is being seen today for migraine with nausea.  HPI: Patient does video visit today c/o migraine with nausea. She has a mass on her brain and she is waiting for neurology to call her.   Migraine  This is a recurrent problem. The current episode started yesterday. The problem occurs constantly. The problem has been gradually worsening. The pain is located in the Left unilateral region. The pain does not radiate. The pain quality is similar to prior headaches. The quality of the pain is described as pulsating, shooting, thunderclap and squeezing. The pain is at a severity of 10/10. The pain is severe. Pertinent negatives include no back pain, numbness, scalp tenderness, sinus pressure or tingling. The symptoms are aggravated by activity (light). Treatments tried: goody powder. The treatment provided no relief.    Review of Systems  HENT:  Negative for sinus pressure.   Musculoskeletal:  Negative for back pain.  Neurological:  Negative for tingling and numbness.    Problems:  Patient Active Problem List   Diagnosis Date Noted   Closed fracture of lower end of left radius with routine healing 06/26/2021  Dysmenorrhea 11/09/2012   ACUTE BRONCHOSPASM 12/12/2009   NECK PAIN 06/13/2009   NAUSEA AND VOMITING 05/24/2009   ACUTE LARYNGITIS, WITHOUT MENTION OF OBSTRUCTIO 05/02/2009   WEIGHT GAIN 04/22/2009   HEADACHE 04/22/2009   UNSPECIFIED VISUAL LOSS 01/17/2009   ACUTE CYSTITIS 01/17/2009   Vaginitis and vulvovaginitis, unspecified 01/17/2009   FATIGUE 01/17/2009   ACUTE SINUSITIS, UNSPECIFIED 11/08/2008   ACUTE BRONCHITIS 11/08/2008   BIPOLAR DISORDER UNSPECIFIED 08/12/2008   NICOTINE ADDICTION 08/12/2008     Allergies: No Known Allergies Medications:  Current Outpatient Medications:    acetaminophen (TYLENOL) 500 MG tablet, Take 2 tablets (1,000 mg total) by mouth every 6 (six) hours as needed. Max 4,'000mg'$  daily, Disp: 30 tablet, Rfl: 0   ibuprofen (ADVIL) 800 MG tablet, Take 1 tablet (800 mg total) by mouth every 8 (eight) hours as needed., Disp: 90 tablet, Rfl: 0   misoprostol (CYTOTEC) 200 MCG tablet, Take 4 tablets (800 mcg total) by mouth once for 1 dose., Disp: 4 tablet, Rfl: 0   naproxen (NAPROSYN) 375 MG tablet, Take 1 tablet (375 mg total) by mouth 2 (two) times daily with a meal., Disp: 20 tablet, Rfl: 0   oxyCODONE (ROXICODONE) 5 MG immediate release tablet, Take 1-2 tablets (5-10 mg total) by mouth every 4 (four) hours as needed for severe pain., Disp: 20 tablet, Rfl: 0   promethazine (PHENERGAN) 25 MG tablet, Take 1 tablet (25 mg total) by mouth every 6 (six) hours as needed for nausea or vomiting. (Patient not taking: Reported on 06/27/2021), Disp: 20 tablet, Rfl: 0   sulfamethoxazole-trimethoprim (BACTRIM DS) 800-160 MG tablet, Take 1 tablet by mouth 2 (two) times daily., Disp: 20 tablet, Rfl: 0  Observations/Objective: Patient is well-developed, well-nourished in no acute distress.  Resting comfortably  at home.  Head is normocephalic, atraumatic.  No labored breathing.  Speech is clear and coherent with logical content.  Patient is alert and oriented at baseline.    Assessment and Plan:  Marie Brown in today with chief complaint of Migraine   1. Migraine with aura and without status migrainosus, not intractable 2. Nausea and vomiting, unspecified vomiting type Rest  in dark room Cool compress to forehead Contact neurology on Monday  Meds ordered this encounter  Medications   naproxen (NAPROSYN) 500 MG tablet    Sig: Take 1 tablet (500 mg total) by mouth 2 (two) times daily with a meal.    Dispense:  60 tablet    Refill:  1    Order Specific Question:    Supervising Provider    Answer:   MILLER, BRIAN [[id number]]   ondansetron (ZOFRAN) 4 MG tablet    Sig: Take 1 tablet (4 mg total) by mouth every 8 (eight) hours as needed for nausea or vomiting.    Dispense:  20 tablet    Refill:  0    Order Specific Question:   Supervising Provider    Answer:   Noemi Chapel [[id number]]      Follow Up Instructions: I discussed the assessment and treatment plan with the patient. The patient was provided an opportunity to ask questions and all were answered. The patient agreed with the plan and demonstrated an understanding of the instructions.  A copy of instructions were sent to the patient via MyChart.  The patient was advised to call back or seek an in-person evaluation if the symptoms worsen or if the condition fails to improve as anticipated.  Time:  I spent 10 minutes with the patient via  telehealth technology discussing the above problems/concerns.    Mary-Margaret Hassell Done, FNP

## 2021-10-26 NOTE — Patient Instructions (Signed)
Migraine Headache ?A migraine headache is a very strong throbbing pain on one side or both sides of your head. This type of headache can also cause other symptoms. It can last from 4 hours to 3 days. Talk with your doctor about what things may bring on (trigger) this condition. ?What are the causes? ?The exact cause of this condition is not known. This condition may be triggered or caused by: ?Drinking alcohol. ?Smoking. ?Taking medicines, such as: ?Medicine used to treat chest pain (nitroglycerin). ?Birth control pills. ?Estrogen. ?Some blood pressure medicines. ?Eating or drinking certain products. ?Doing physical activity. ?Other things that may trigger a migraine headache include: ?Having a menstrual period. ?Pregnancy. ?Hunger. ?Stress. ?Not getting enough sleep or getting too much sleep. ?Weather changes. ?Tiredness (fatigue). ?What increases the risk? ?Being 25-55 years old. ?Being female. ?Having a family history of migraine headaches. ?Being Caucasian. ?Having depression or anxiety. ?Being very overweight. ?What are the signs or symptoms? ?A throbbing pain. This pain may: ?Happen in any area of the head, such as on one side or both sides. ?Make it hard to do daily activities. ?Get worse with physical activity. ?Get worse around bright lights or loud noises. ?Other symptoms may include: ?Feeling sick to your stomach (nauseous). ?Vomiting. ?Dizziness. ?Being sensitive to bright lights, loud noises, or smells. ?Before you get a migraine headache, you may get warning signs (an aura). An aura may include: ?Seeing flashing lights or having blind spots. ?Seeing bright spots, halos, or zigzag lines. ?Having tunnel vision or blurred vision. ?Having numbness or a tingling feeling. ?Having trouble talking. ?Having weak muscles. ?Some people have symptoms after a migraine headache (postdromal phase), such as: ?Tiredness. ?Trouble thinking (concentrating). ?How is this treated? ?Taking medicines that: ?Relieve  pain. ?Relieve the feeling of being sick to your stomach. ?Prevent migraine headaches. ?Treatment may also include: ?Having acupuncture. ?Avoiding foods that bring on migraine headaches. ?Learning ways to control your body functions (biofeedback). ?Therapy to help you know and deal with negative thoughts (cognitive behavioral therapy). ?Follow these instructions at home: ?Medicines ?Take over-the-counter and prescription medicines only as told by your doctor. ?Ask your doctor if the medicine prescribed to you: ?Requires you to avoid driving or using heavy machinery. ?Can cause trouble pooping (constipation). You may need to take these steps to prevent or treat trouble pooping: ?Drink enough fluid to keep your pee (urine) pale yellow. ?Take over-the-counter or prescription medicines. ?Eat foods that are high in fiber. These include beans, whole grains, and fresh fruits and vegetables. ?Limit foods that are high in fat and sugar. These include fried or sweet foods. ?Lifestyle ?Do not drink alcohol. ?Do not use any products that contain nicotine or tobacco, such as cigarettes, e-cigarettes, and chewing tobacco. If you need help quitting, ask your doctor. ?Get at least 8 hours of sleep every night. ?Limit and deal with stress. ?General instructions ? ?  ? ?Keep a journal to find out what may bring on your migraine headaches. For example, write down: ?What you eat and drink. ?How much sleep you get. ?Any change in what you eat or drink. ?Any change in your medicines. ?If you have a migraine headache: ?Avoid things that make your symptoms worse, such as bright lights. ?It may help to lie down in a dark, quiet room. ?Do not drive or use heavy machinery. ?Ask your doctor what activities are safe for you. ?Keep all follow-up visits as told by your doctor. This is important. ?Contact a doctor if: ?You get a migraine   headache that is different or worse than others you have had. ?You have more than 15 headache days in one  month. ?Get help right away if: ?Your migraine headache gets very bad. ?Your migraine headache lasts longer than 72 hours. ?You have a fever. ?You have a stiff neck. ?You have trouble seeing. ?Your muscles feel weak or like you cannot control them. ?You start to lose your balance a lot. ?You start to have trouble walking. ?You pass out (faint). ?You have a seizure. ?Summary ?A migraine headache is a very strong throbbing pain on one side or both sides of your head. These headaches can also cause other symptoms. ?This condition may be treated with medicines and changes to your lifestyle. ?Keep a journal to find out what may bring on your migraine headaches. ?Contact a doctor if you get a migraine headache that is different or worse than others you have had. ?Contact your doctor if you have more than 15 headache days in a month. ?This information is not intended to replace advice given to you by your health care provider. Make sure you discuss any questions you have with your health care provider. ?Document Revised: 08/27/2018 Document Reviewed: 06/17/2018 ?Elsevier Patient Education ? 2023 [employer] ? ?

## 2021-11-06 ENCOUNTER — Encounter: Payer: Medicaid Other | Admitting: Orthopedic Surgery

## 2021-11-08 ENCOUNTER — Ambulatory Visit (INDEPENDENT_AMBULATORY_CARE_PROVIDER_SITE_OTHER): Payer: Medicaid Other

## 2021-11-08 ENCOUNTER — Encounter: Payer: Self-pay | Admitting: Orthopedic Surgery

## 2021-11-08 ENCOUNTER — Ambulatory Visit (INDEPENDENT_AMBULATORY_CARE_PROVIDER_SITE_OTHER): Payer: Medicaid Other | Admitting: Orthopedic Surgery

## 2021-11-08 DIAGNOSIS — M25532 Pain in left wrist: Secondary | ICD-10-CM

## 2021-11-08 DIAGNOSIS — Z8781 Personal history of (healed) traumatic fracture: Secondary | ICD-10-CM | POA: Diagnosis not present

## 2021-11-08 DIAGNOSIS — M654 Radial styloid tenosynovitis [de Quervain]: Secondary | ICD-10-CM | POA: Diagnosis not present

## 2021-11-08 MED ORDER — PREDNISONE 10 MG (21) PO TBPK: ORAL_TABLET | ORAL | 0 refills | Status: DC

## 2021-11-08 NOTE — Progress Notes (Signed)
New Patient Visit  Assessment: Marie Brown is a 31 y.o. female with the following: 1. De Quervain's tenosynovitis, left  Plan: AALA MALLIK had a left distal radius fracture, which was treated earlier this year.  Recently, she has developed pain, consistent with de Quervain's tenosynovitis.  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  We discussed multiple treatment options.  She is elected to try some prednisone, as well as a brace.  Once the prednisone is complete, she can try some NSAIDs at home.  She continues to have issues, we can try an injection.  All questions were answered, she is amenable to this plan.  Follow-up: Return if symptoms worsen or fail to improve, for Dr. Romeo Apple or Dr. Dallas Schimke .  Subjective:  Chief Complaint  Patient presents with   Wrist Pain    LT wrist Painful x 1 1/2 weeks Keeps patient up at night    History of Present Illness: Marie Brown is a 31 y.o. female who presents for evaluation of left wrist pain.  She had a distal radius fracture, treated without surgery earlier this year.  She saw my partner, Dr. Romeo Apple.  Over the last couple weeks, she has developed pain on the radial side of the wrist.  Pain gets worse with motion.  She works long hours at [employer].  She states the pain has kept her up at night.  She is not taking any medications.  No recent injuries.  No popping or snapping sensations in the left wrist.   Review of Systems: No fevers or chills\ No numbness or tingling No chest pain No shortness of breath No bowel or bladder dysfunction No GI distress No headaches   Medical History:  Past Medical History:  Diagnosis Date   Bipolar 2 disorder (HCC)    Carpal tunnel syndrome    Kidney stones    Migraine     Past Surgical History:  Procedure Laterality Date   APPENDECTOMY     FOOT SURGERY Bilateral 2005    Family History  Problem Relation Age of Onset   Hypertension Mother    Diabetes Other     Thyroid disease Other    Heart disease Other    Birth defects Other    Mental illness Other    Stroke Other    Breast cancer Other    Graves' disease Maternal Grandmother    Multiple sclerosis Maternal Grandfather    Suicidality Father    Social History   Tobacco Use   Smoking status: Every Day    Packs/day: 0.50    Years: 3.00    Total pack years: 1.50    Types: Cigarettes    Last attempt to quit: 04/18/2016    Years since quitting: 5.5   Smokeless tobacco: Never  Vaping Use   Vaping Use: Never used  Substance Use Topics   Alcohol use: Yes    Comment: occ.   Drug use: No    No Known Allergies  Current Meds  Medication Sig   acetaminophen (TYLENOL) 500 MG tablet Take 2 tablets (1,000 mg total) by mouth every 6 (six) hours as needed. Max 4,000mg  daily   ibuprofen (ADVIL) 800 MG tablet Take 1 tablet (800 mg total) by mouth every 8 (eight) hours as needed.   naproxen (NAPROSYN) 500 MG tablet Take 1 tablet (500 mg total) by mouth 2 (two) times daily with a meal.   ondansetron (ZOFRAN) 4 MG tablet Take 1 tablet (4  mg total) by mouth every 8 (eight) hours as needed for nausea or vomiting.   oxyCODONE (ROXICODONE) 5 MG immediate release tablet Take 1-2 tablets (5-10 mg total) by mouth every 4 (four) hours as needed for severe pain.   predniSONE (STERAPRED UNI-PAK 21 TAB) 10 MG (21) TBPK tablet 10 mg DS 12 as directed   promethazine (PHENERGAN) 25 MG tablet Take 1 tablet (25 mg total) by mouth every 6 (six) hours as needed for nausea or vomiting.   sulfamethoxazole-trimethoprim (BACTRIM DS) 800-160 MG tablet Take 1 tablet by mouth 2 (two) times daily.    Objective: There were no vitals taken for this visit.  Physical Exam:  General: Alert and oriented. and No acute distress. Gait: Normal gait.  Left wrist without deformity.  No redness.  No bruising.  She has comfortable active range of motion of the left wrist.  She can extend the thumb without pain.  EPL is intact.   Fingers warm and well-perfused.  Positive Finkelstein's.  Tenderness to palpation over the first dorsal compartment.  Sensation is intact throughout the left hand.  IMAGING: I personally ordered and reviewed the following images  X-rays of the left wrist were obtained in clinic today.  There is a healed fracture of the distal radius.  Overall alignment remains unchanged.  No acute injuries are noted.  Radial height maintained.  Impression: Healed left distal radius fracture, without acute injury.   New Medications:  Meds ordered this encounter  Medications   predniSONE (STERAPRED UNI-PAK 21 TAB) 10 MG (21) TBPK tablet    Sig: 10 mg DS 12 as directed    Dispense:  48 tablet    Refill:  0      Oliver Barre, MD  11/08/2021 8:26 AM

## 2021-11-11 DIAGNOSIS — R002 Palpitations: Secondary | ICD-10-CM | POA: Diagnosis not present

## 2021-11-11 DIAGNOSIS — R55 Syncope and collapse: Secondary | ICD-10-CM | POA: Diagnosis not present

## 2021-11-11 DIAGNOSIS — D509 Iron deficiency anemia, unspecified: Secondary | ICD-10-CM | POA: Diagnosis not present

## 2021-11-11 MED ORDER — TRAMADOL HCL 50 MG PO TABS
50.0000 mg | ORAL_TABLET | Freq: Four times a day (QID) | ORAL | 0 refills | Status: DC | PRN
Start: 2021-11-11 — End: 2021-12-13

## 2021-11-14 DIAGNOSIS — R55 Syncope and collapse: Secondary | ICD-10-CM | POA: Diagnosis not present

## 2021-11-17 DIAGNOSIS — R519 Headache, unspecified: Secondary | ICD-10-CM | POA: Diagnosis not present

## 2021-11-17 DIAGNOSIS — M542 Cervicalgia: Secondary | ICD-10-CM | POA: Diagnosis not present

## 2021-12-13 ENCOUNTER — Other Ambulatory Visit: Payer: Self-pay | Admitting: Orthopedic Surgery

## 2021-12-16 MED ORDER — TRAMADOL HCL 50 MG PO TABS: 50.0000 mg | ORAL_TABLET | Freq: Four times a day (QID) | ORAL | 0 refills | Status: DC | PRN

## 2022-01-13 DIAGNOSIS — R55 Syncope and collapse: Secondary | ICD-10-CM | POA: Diagnosis not present

## 2022-02-02 ENCOUNTER — Emergency Department (HOSPITAL_BASED_OUTPATIENT_CLINIC_OR_DEPARTMENT_OTHER): Payer: Medicaid Other

## 2022-02-02 ENCOUNTER — Encounter (HOSPITAL_BASED_OUTPATIENT_CLINIC_OR_DEPARTMENT_OTHER): Payer: Self-pay

## 2022-02-02 ENCOUNTER — Emergency Department (HOSPITAL_BASED_OUTPATIENT_CLINIC_OR_DEPARTMENT_OTHER)
Admission: EM | Admit: 2022-02-02 | Discharge: 2022-02-02 | Disposition: A | Discharge status: Home / Self Care | Payer: Medicaid Other | Attending: Emergency Medicine | Admitting: Emergency Medicine

## 2022-02-02 ENCOUNTER — Other Ambulatory Visit: Payer: Self-pay

## 2022-02-02 DIAGNOSIS — N132 Hydronephrosis with renal and ureteral calculous obstruction: Secondary | ICD-10-CM | POA: Insufficient documentation

## 2022-02-02 DIAGNOSIS — N3001 Acute cystitis with hematuria: Secondary | ICD-10-CM | POA: Insufficient documentation

## 2022-02-02 DIAGNOSIS — N200 Calculus of kidney: Secondary | ICD-10-CM

## 2022-02-02 DIAGNOSIS — R112 Nausea with vomiting, unspecified: Secondary | ICD-10-CM | POA: Diagnosis present

## 2022-02-02 LAB — URINALYSIS, ROUTINE W REFLEX MICROSCOPIC
Bilirubin Urine: NEGATIVE
Glucose, UA: NEGATIVE mg/dL
Nitrite: NEGATIVE
RBC / HPF: >50 RBC/hpf — ABNORMAL HIGH (ref 0–5)
Specific Gravity, Urine: 1.021 (ref 1.005–1.030)
pH: 6 (ref 5.0–8.0)

## 2022-02-02 LAB — COMPREHENSIVE METABOLIC PANEL
ALT: 16 U/L (ref 0–44)
AST: 21 U/L (ref 15–41)
Albumin: 5.1 g/dL — ABNORMAL HIGH (ref 3.5–5.0)
Alkaline Phosphatase: 41 U/L (ref 38–126)
Anion gap: 13 (ref 5–15)
BUN: 16 mg/dL (ref 6–20)
CO2: 25 mmol/L (ref 22–32)
Calcium: 10 mg/dL (ref 8.9–10.3)
Chloride: 101 mmol/L (ref 98–111)
Creatinine, Ser: 0.77 mg/dL (ref 0.44–1.00)
GFR, Estimated: >60 mL/min (ref >60)
Glucose, Bld: 121 mg/dL — ABNORMAL HIGH (ref 70–99)
Potassium: 3.8 mmol/L (ref 3.5–5.1)
Sodium: 139 mmol/L (ref 135–145)
Total Bilirubin: 0.7 mg/dL (ref 0.3–1.2)
Total Protein: 8.2 g/dL — ABNORMAL HIGH (ref 6.5–8.1)

## 2022-02-02 LAB — CBC
HCT: 41.4 % (ref 36.0–46.0)
Hemoglobin: 13.8 g/dL (ref 12.0–15.0)
MCH: 32.1 pg (ref 26.0–34.0)
MCHC: 33.3 g/dL (ref 30.0–36.0)
MCV: 96.3 fL (ref 80.0–100.0)
Platelets: 292 10*3/uL (ref 150–400)
RBC: 4.3 MIL/uL (ref 3.87–5.11)
RDW: 13 % (ref 11.5–15.5)
WBC: 7.4 10*3/uL (ref 4.0–10.5)
nRBC: 0 % (ref 0.0–0.2)

## 2022-02-02 LAB — LIPASE, BLOOD: Lipase: <10 U/L — ABNORMAL LOW (ref 11–51)

## 2022-02-02 LAB — PREGNANCY, URINE: Preg Test, Ur: NEGATIVE

## 2022-02-02 MED ORDER — ONDANSETRON HCL 4 MG/2ML IJ SOLN
4.0000 mg | Freq: Once | INTRAMUSCULAR | Status: AC
  Administered 2022-02-02: 4 mg via INTRAVENOUS
  Filled 2022-02-02: qty 2

## 2022-02-02 MED ORDER — CEPHALEXIN 500 MG PO CAPS: 500.0000 mg | ORAL_CAPSULE | Freq: Two times a day (BID) | ORAL | 0 refills | Status: DC

## 2022-02-02 MED ORDER — IBUPROFEN 800 MG PO TABS: 800.0000 mg | ORAL_TABLET | Freq: Three times a day (TID) | ORAL | 0 refills | Status: DC

## 2022-02-02 MED ORDER — KETOROLAC TROMETHAMINE 15 MG/ML IJ SOLN
15.0000 mg | Freq: Once | INTRAMUSCULAR | Status: AC
  Administered 2022-02-02: 15 mg via INTRAVENOUS
  Filled 2022-02-02: qty 1

## 2022-02-02 MED ORDER — ONDANSETRON 4 MG PO TBDP: 4.0000 mg | ORAL_TABLET | Freq: Three times a day (TID) | ORAL | 0 refills | Status: DC | PRN

## 2022-02-02 MED ORDER — CEPHALEXIN 500 MG PO CAPS: 500.0000 mg | ORAL_CAPSULE | Freq: Two times a day (BID) | ORAL | 0 refills | Status: AC

## 2022-02-02 MED ORDER — LACTATED RINGERS IV BOLUS
1000.0000 mL | Freq: Once | INTRAVENOUS | Status: AC
  Administered 2022-02-02: 1000 mL via INTRAVENOUS

## 2022-02-02 MED ORDER — TAMSULOSIN HCL 0.4 MG PO CAPS: 0.4000 mg | ORAL_CAPSULE | Freq: Every day | ORAL | 0 refills | Status: AC

## 2022-02-02 MED ORDER — TAMSULOSIN HCL 0.4 MG PO CAPS: 0.4000 mg | ORAL_CAPSULE | Freq: Every day | ORAL | 0 refills | Status: DC

## 2022-02-02 NOTE — ED Provider Notes (Signed)
[facility] EMERGENCY DEPT Provider Note   CSN: [id number] Arrival date & time: 02/02/22  5625     History  Chief Complaint  Patient presents with   Nausea   Emesis    Marie Brown is a 31 y.o. female.   Emesis    31 year old female with medical history significant for bipolar 2 disorder, nephrolithiasis, migraine headaches who presents to the emergency department with nausea, vomiting, left flank pain since this past Friday.  The patient states that she cannot keep anything down.  She endorses some pink urine when she wipes.  Denies any vaginal bleeding, pelvic pain, vaginal discharge.  She is concerned she could be pregnant.  She denies any chest pain or shortness of breath, denies any fevers or chills.  Denies any burning while urinating.  Endorses persistent nausea with NBNB emesis.  Home Medications Prior to Admission medications   Medication Sig Start Date End Date Taking? Authorizing Provider  cephALEXin (KEFLEX) 500 MG capsule Take 1 capsule (500 mg total) by mouth 2 (two) times daily for 5 days. 02/02/22 02/07/22 Yes Regan Lemming, MD  ibuprofen (ADVIL) 800 MG tablet Take 1 tablet (800 mg total) by mouth 3 (three) times daily. 02/02/22  Yes Regan Lemming, MD  ondansetron (ZOFRAN-ODT) 4 MG disintegrating tablet Take 1 tablet (4 mg total) by mouth every 8 (eight) hours as needed. 02/02/22  Yes Regan Lemming, MD  tamsulosin (FLOMAX) 0.4 MG CAPS capsule Take 1 capsule (0.4 mg total) by mouth daily for 5 days. 02/02/22 02/07/22 Yes Regan Lemming, MD  predniSONE (STERAPRED UNI-PAK 21 TAB) 10 MG (21) TBPK tablet 10 mg DS 12 as directed 11/08/21   Mordecai Rasmussen, MD  traMADol (ULTRAM) 50 MG tablet Take 1 tablet (50 mg total) by mouth every 6 (six) hours as needed. 12/16/21   Mordecai Rasmussen, MD      Allergies    Patient has no known allergies.    Review of Systems   Review of Systems  Gastrointestinal:  Positive for nausea and vomiting.  Genitourinary:  Positive  for flank pain.  All other systems reviewed and are negative.   Physical Exam Updated Vital Signs BP (!) 142/65   Pulse 66   Temp 98.8 F (37.1 C) (Oral)   Resp 15   Ht '5\' 3"'$  (1.6 m)   Wt 71.7 kg   LMP 01/21/2022 (Exact Date)   SpO2 97%   BMI 27.99 kg/m  Physical Exam Vitals and nursing note reviewed.  Constitutional:      General: She is not in acute distress.    Appearance: She is well-developed.     Comments: Uncomfortable appearing, retching  HENT:     Head: Normocephalic and atraumatic.  Eyes:     Conjunctiva/sclera: Conjunctivae normal.  Cardiovascular:     Rate and Rhythm: Normal rate and regular rhythm.     Heart sounds: No murmur heard. Pulmonary:     Effort: Pulmonary effort is normal. No respiratory distress.     Breath sounds: Normal breath sounds.  Abdominal:     Palpations: Abdomen is soft.     Tenderness: There is abdominal tenderness in the left lower quadrant. There is left CVA tenderness. There is no guarding or rebound.  Musculoskeletal:        General: No swelling.     Cervical back: Neck supple.  Skin:    General: Skin is warm and dry.     Capillary Refill: Capillary refill takes less than 2 seconds.  Neurological:     Mental Status: She is alert.  Psychiatric:        Mood and Affect: Mood normal.     ED Results / Procedures / Treatments   Labs (all labs ordered are listed, but only abnormal results are displayed) Labs Reviewed  LIPASE, BLOOD - Abnormal; Notable for the following components:      Result Value   Lipase <10 (*)    All other components within normal limits  COMPREHENSIVE METABOLIC PANEL - Abnormal; Notable for the following components:   Glucose, Bld 121 (*)    Total Protein 8.2 (*)    Albumin 5.1 (*)    All other components within normal limits  URINALYSIS, ROUTINE W REFLEX MICROSCOPIC - Abnormal; Notable for the following components:   Color, Urine ORANGE (*)    APPearance HAZY (*)    Hgb urine dipstick LARGE (*)     Ketones, ur TRACE (*)    Protein, ur TRACE (*)    Leukocytes,Ua MODERATE (*)    RBC / HPF >50 (*)    Bacteria, UA RARE (*)    All other components within normal limits  CBC  PREGNANCY, URINE    EKG None  Radiology CT Renal Stone Study  Result Date: 02/02/2022 CLINICAL DATA:  Flank pain.  Vomiting. EXAM: CT ABDOMEN AND PELVIS WITHOUT CONTRAST TECHNIQUE: Multidetector CT imaging of the abdomen and pelvis was performed following the standard protocol without IV contrast. RADIATION DOSE REDUCTION: This exam was performed according to the departmental dose-optimization program which includes automated exposure control, adjustment of the mA and/or kV according to patient size and/or use of iterative reconstruction technique. COMPARISON:  01/26/2021 FINDINGS: Lower chest: Unremarkable. Hepatobiliary: No suspicious focal abnormality in the liver on this study without intravenous contrast. There is no evidence for gallstones, gallbladder wall thickening, or pericholecystic fluid. No intrahepatic or extrahepatic biliary dilation. Pancreas: No focal mass lesion. No dilatation of the main duct. No intraparenchymal cyst. No peripancreatic edema. Spleen: No splenomegaly. No focal mass lesion. Adrenals/Urinary Tract: No adrenal nodule or mass. Approximately 3 punctate nonobstructing stones are seen in the right kidney. No right ureteral stones. No secondary changes in the right kidney or ureter. 3 mm nonobstructing stone noted lower pole left kidney. Mild left hydroureteronephrosis identified secondary to the presence of a 4 x 5 x 5 mm stone at or just distal to the left UPJ (axial 36/2 and coronal 51/5). No other left ureteral stone. No bladder stone. Stomach/Bowel: Stomach is unremarkable. No gastric wall thickening. No evidence of outlet obstruction. Duodenum is normally positioned as is the ligament of Treitz. No small bowel wall thickening. No small bowel dilatation. The terminal ileum is normal. The  appendix is not well visualized, but there is no edema or inflammation in the region of the cecum. No gross colonic mass. No colonic wall thickening. Vascular/Lymphatic: No abdominal aortic aneurysm. There is no gastrohepatic or hepatoduodenal ligament lymphadenopathy. No retroperitoneal or mesenteric lymphadenopathy. No pelvic sidewall lymphadenopathy. Reproductive: The uterus is unremarkable.  There is no adnexal mass. Other: No intraperitoneal free fluid. Musculoskeletal: No worrisome lytic or sclerotic osseous abnormality. IMPRESSION: 1. 4 x 5 x 5 mm stone at or just distal to the left UPJ causes mild left hydroureteronephrosis. 2. Bilateral nonobstructing nephrolithiasis. Electronically Signed   By: Misty Stanley M.D.   On: 02/02/2022 11:26    Procedures Procedures    Medications Ordered in ED Medications  lactated ringers bolus 1,000 mL (1,000 mLs Intravenous New Bag/Given 02/02/22  1134)  ondansetron (ZOFRAN) injection 4 mg (4 mg Intravenous Given 02/02/22 1134)  ketorolac (TORADOL) 15 MG/ML injection 15 mg (15 mg Intravenous Given 02/02/22 1134)    ED Course/ Medical Decision Making/ A&P Clinical Course as of 02/02/22 1251  Sun Feb 02, 2022  1052 Chalmers GuestMarland Kitchen): MODERATE [JL]  1052 RBC / HPF(!): >50 [JL]  1052 Bacteria, UA(!): RARE [JL]    Clinical Course User Index [JL] Regan Lemming, MD                           Medical Decision Making Amount and/or Complexity of Data Reviewed Labs: ordered. Decision-making details documented in ED Course. Radiology: ordered.  Risk Prescription drug management.    31 year old female with medical history significant for bipolar 2 disorder, nephrolithiasis, migraine headaches who presents to the emergency department with nausea, vomiting, left flank pain since this past Friday.  The patient states that she cannot keep anything down.  She endorses some pink urine when she wipes.  Denies any vaginal bleeding, pelvic pain, vaginal discharge.  She  is concerned she could be pregnant.  She denies any chest pain or shortness of breath, denies any fevers or chills.  Denies any burning while urinating.  Endorses persistent nausea with NBNB emesis. The patient states that her last bowel movement was this morning and was normal.  Urine pregnancy was negative.  On arrival, the patient was vitally stable, afebrile, not tachycardic or tachypneic, BP 138/97, saturating 98% on room air.  Differential diagnosis includes ovarian torsion, more likely nephrolithiasis, considered UTI/pyelonephritis.  Patient is moving her bowels, low concern for SBO.  Urine pregnancy was negative.  Urinalysis positive for large hemoglobin, greater than 50 RBCs, moderate leukocytes, rare bacteria, 11-20 WBCs, CMP without evidence of AKI, normal renal and liver function, no significant electrolyte abnormality, CBC without leukocytosis or anemia, lipase normal.  IV access was obtained and the patient was administered IV Toradol, an LR bolus and IV Zofran.  CT Stone Study: IMPRESSION:  1. 4 x 5 x 5 mm stone at or just distal to the left UPJ causes mild  left hydroureteronephrosis.  2. Bilateral nonobstructing nephrolithiasis.   We will treat the patient with p.o. Keflex, tamsulosin, Motrin for pain control, Zofran for nausea.  Referral for follow-up with urology placed in patient advised to schedule appointment outpatient with urology.  Overall clinically stable, learning oral intake, not septic, no evidence of AKI, well-appearing symptomatically improved, stable for discharge.  Final Clinical Impression(s) / ED Diagnoses Final diagnoses:  Kidney stone  Acute cystitis with hematuria    Rx / DC Orders ED Discharge Orders          Ordered    Ambulatory referral to Urology        02/02/22 1133    ondansetron (ZOFRAN-ODT) 4 MG disintegrating tablet  Every 8 hours PRN        02/02/22 1249    ibuprofen (ADVIL) 800 MG tablet  3 times daily        02/02/22 1249     tamsulosin (FLOMAX) 0.4 MG CAPS capsule  Daily        02/02/22 1249    cephALEXin (KEFLEX) 500 MG capsule  2 times daily        02/02/22 1250              Regan Lemming, MD 02/02/22 1252

## 2022-02-02 NOTE — Discharge Instructions (Addendum)
Please take a 5-day course of antibiotics, Tylenol and ibuprofen for pain control, Zofran for nausea, Flomax should help as well.  Your CT results: IMPRESSION:  1. 4 x 5 x 5 mm stone at or just distal to the left UPJ causes mild  left hydroureteronephrosis.  2. Bilateral nonobstructing nephrolithiasis.

## 2022-02-02 NOTE — ED Triage Notes (Signed)
Pt presents with c/o vomiting since Friday. Unable to keep anything down.  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  Pt believes she may be pregnant

## 2022-02-05 DIAGNOSIS — Z13220 Encounter for screening for lipoid disorders: Secondary | ICD-10-CM | POA: Diagnosis not present

## 2022-02-05 DIAGNOSIS — Z8349 Family history of other endocrine, nutritional and metabolic diseases: Secondary | ICD-10-CM | POA: Diagnosis not present

## 2022-02-05 DIAGNOSIS — Z23 Encounter for immunization: Secondary | ICD-10-CM | POA: Diagnosis not present

## 2022-02-05 DIAGNOSIS — Z1151 Encounter for screening for human papillomavirus (HPV): Secondary | ICD-10-CM | POA: Diagnosis not present

## 2022-02-05 DIAGNOSIS — R002 Palpitations: Secondary | ICD-10-CM | POA: Diagnosis not present

## 2022-02-05 DIAGNOSIS — Z113 Encounter for screening for infections with a predominantly sexual mode of transmission: Secondary | ICD-10-CM | POA: Diagnosis not present

## 2022-02-05 DIAGNOSIS — Z124 Encounter for screening for malignant neoplasm of cervix: Secondary | ICD-10-CM | POA: Diagnosis not present

## 2022-02-05 DIAGNOSIS — N200 Calculus of kidney: Secondary | ICD-10-CM | POA: Diagnosis not present

## 2022-02-05 DIAGNOSIS — Z0000 Encounter for general adult medical examination without abnormal findings: Secondary | ICD-10-CM | POA: Diagnosis not present

## 2022-02-05 DIAGNOSIS — F319 Bipolar disorder, unspecified: Secondary | ICD-10-CM | POA: Diagnosis not present

## 2022-02-05 DIAGNOSIS — G43109 Migraine with aura, not intractable, without status migrainosus: Secondary | ICD-10-CM | POA: Insufficient documentation

## 2022-02-11 DIAGNOSIS — E785 Hyperlipidemia, unspecified: Secondary | ICD-10-CM | POA: Diagnosis not present

## 2022-02-11 DIAGNOSIS — R55 Syncope and collapse: Secondary | ICD-10-CM | POA: Diagnosis not present

## 2022-02-11 DIAGNOSIS — R000 Tachycardia, unspecified: Secondary | ICD-10-CM | POA: Diagnosis not present

## 2022-02-14 ENCOUNTER — Other Ambulatory Visit: Payer: Self-pay | Admitting: Orthopedic Surgery

## 2022-02-19 ENCOUNTER — Ambulatory Visit (HOSPITAL_BASED_OUTPATIENT_CLINIC_OR_DEPARTMENT_OTHER): Payer: Medicaid Other | Admitting: Orthopaedic Surgery

## 2022-03-11 DIAGNOSIS — I4711 Inappropriate sinus tachycardia, so stated: Secondary | ICD-10-CM | POA: Insufficient documentation

## 2022-03-11 DIAGNOSIS — R002 Palpitations: Secondary | ICD-10-CM | POA: Diagnosis not present

## 2022-03-11 DIAGNOSIS — Q283 Other malformations of cerebral vessels: Secondary | ICD-10-CM | POA: Diagnosis not present

## 2022-03-11 DIAGNOSIS — R55 Syncope and collapse: Secondary | ICD-10-CM | POA: Diagnosis not present

## 2022-03-17 ENCOUNTER — Other Ambulatory Visit: Payer: Self-pay

## 2022-03-17 ENCOUNTER — Encounter (HOSPITAL_BASED_OUTPATIENT_CLINIC_OR_DEPARTMENT_OTHER): Payer: Self-pay | Admitting: Emergency Medicine

## 2022-03-17 ENCOUNTER — Emergency Department (HOSPITAL_BASED_OUTPATIENT_CLINIC_OR_DEPARTMENT_OTHER)
Admission: EM | Admit: 2022-03-17 | Discharge: 2022-03-18 | Disposition: A | Discharge status: Home / Self Care | Payer: Medicaid Other | Attending: Emergency Medicine | Admitting: Emergency Medicine

## 2022-03-17 DIAGNOSIS — R200 Anesthesia of skin: Secondary | ICD-10-CM | POA: Insufficient documentation

## 2022-03-17 DIAGNOSIS — R251 Tremor, unspecified: Secondary | ICD-10-CM | POA: Diagnosis not present

## 2022-03-17 DIAGNOSIS — R202 Paresthesia of skin: Secondary | ICD-10-CM | POA: Diagnosis not present

## 2022-03-17 LAB — CBC WITH DIFFERENTIAL/PLATELET
Abs Immature Granulocytes: 0.01 10*3/uL (ref 0.00–0.07)
Basophils Absolute: 0 10*3/uL (ref 0.0–0.1)
Basophils Relative: 1 %
Eosinophils Absolute: 0 10*3/uL (ref 0.0–0.5)
Eosinophils Relative: 0 %
HCT: 38.8 % (ref 36.0–46.0)
Hemoglobin: 12.5 g/dL (ref 12.0–15.0)
Immature Granulocytes: 0 %
Lymphocytes Relative: 35 %
Lymphs Abs: 1.7 10*3/uL (ref 0.7–4.0)
MCH: 31.3 pg (ref 26.0–34.0)
MCHC: 32.2 g/dL (ref 30.0–36.0)
MCV: 97.2 fL (ref 80.0–100.0)
Monocytes Absolute: 0.4 10*3/uL (ref 0.1–1.0)
Monocytes Relative: 9 %
Neutro Abs: 2.7 10*3/uL (ref 1.7–7.7)
Neutrophils Relative %: 55 %
Platelets: 439 10*3/uL — ABNORMAL HIGH (ref 150–400)
RBC: 3.99 MIL/uL (ref 3.87–5.11)
RDW: 12.6 % (ref 11.5–15.5)
WBC: 4.8 10*3/uL (ref 4.0–10.5)
nRBC: 0 % (ref 0.0–0.2)

## 2022-03-17 LAB — BASIC METABOLIC PANEL
Anion gap: 9 (ref 5–15)
BUN: 11 mg/dL (ref 6–20)
CO2: 25 mmol/L (ref 22–32)
Calcium: 9.3 mg/dL (ref 8.9–10.3)
Chloride: 107 mmol/L (ref 98–111)
Creatinine, Ser: 0.7 mg/dL (ref 0.44–1.00)
GFR, Estimated: >60 mL/min (ref >60)
Glucose, Bld: 90 mg/dL (ref 70–99)
Potassium: 3.5 mmol/L (ref 3.5–5.1)
Sodium: 141 mmol/L (ref 135–145)

## 2022-03-17 NOTE — ED Notes (Signed)
Pt called multiple times for MRI and for recheck vitals, no answer

## 2022-03-17 NOTE — ED Notes (Signed)
ED Provider at bedside. 

## 2022-03-17 NOTE — ED Triage Notes (Signed)
Pt arrives to ED with c/o numbness and shakiness. She notes that her face as been numb since 0930 this morning and it is bilateral. She notes that she has full body shakes that is constant. She has had hx of same symptoms. She sees Neurology in November. 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 She notes that her hands are tingling bilaterally.

## 2022-03-17 NOTE — ED Provider Notes (Signed)
[facility] DEPT Provider Note   CSN: [id number] Arrival date & time: 03/17/22  1307     History  Chief Complaint  Patient presents with   Numbness    Marie Brown is a 31 y.o. female.  The history is provided by the patient and medical records. No language interpreter was used.  Neurologic Problem This is a new problem. The current episode started 3 to 5 hours ago. The problem occurs constantly. The problem has not changed since onset.Associated symptoms include headaches (resolved). Pertinent negatives include no chest pain, no abdominal pain and no shortness of breath. Nothing aggravates the symptoms. Nothing relieves the symptoms. She has tried nothing for the symptoms. The treatment provided no relief.       Home Medications Prior to Admission medications   Medication Sig Start Date End Date Taking? Authorizing Provider  ibuprofen (ADVIL) 800 MG tablet Take 1 tablet (800 mg total) by mouth 3 (three) times daily. 02/02/22   Regan Lemming, MD  ondansetron (ZOFRAN-ODT) 4 MG disintegrating tablet Take 1 tablet (4 mg total) by mouth every 8 (eight) hours as needed. 02/02/22   Regan Lemming, MD  predniSONE (STERAPRED UNI-PAK 21 TAB) 10 MG (21) TBPK tablet 10 mg DS 12 as directed 11/08/21   Mordecai Rasmussen, MD  traMADol (ULTRAM) 50 MG tablet Take 1 tablet (50 mg total) by mouth every 6 (six) hours as needed. 12/16/21   Mordecai Rasmussen, MD      Allergies    Patient has no known allergies.    Review of Systems   Review of Systems  Constitutional:  Positive for fatigue. Negative for chills and fever.  HENT:  Negative for congestion.   Eyes:  Negative for visual disturbance.  Respiratory:  Negative for cough, chest tightness, shortness of breath and wheezing.   Cardiovascular:  Negative for chest pain and palpitations.  Gastrointestinal:  Negative for abdominal pain, constipation, diarrhea, nausea and vomiting.  Genitourinary:  Negative for dysuria and  flank pain.  Musculoskeletal:  Negative for back pain, neck pain and neck stiffness.  Skin:  Negative for rash and wound.  Neurological:  Positive for tremors, weakness (generalized per pt), numbness and headaches (resolved). Negative for facial asymmetry, speech difficulty and light-headedness.  Psychiatric/Behavioral:  Negative for agitation and confusion.   All other systems reviewed and are negative.   Physical Exam Updated Vital Signs BP (!) 152/92 (BP Location: Right Arm)   Pulse 75   Temp (!) 97.5 F (36.4 C) (Temporal)   Resp 18   Ht '5\' 3"'$  (1.6 m)   Wt 74.8 kg   SpO2 100%   BMI 29.23 kg/m  Physical Exam Vitals and nursing note reviewed.  Constitutional:      General: She is not in acute distress.    Appearance: She is well-developed. She is not ill-appearing, toxic-appearing or diaphoretic.  HENT:     Head: Normocephalic and atraumatic.     Nose: Nose normal. No congestion or rhinorrhea.     Mouth/Throat:     Mouth: Mucous membranes are moist.     Pharynx: No oropharyngeal exudate or posterior oropharyngeal erythema.  Eyes:     General: No visual field deficit.    Extraocular Movements: Extraocular movements intact.     Conjunctiva/sclera: Conjunctivae normal.     Pupils: Pupils are equal, round, and reactive to light.  Neck:     Vascular: No carotid bruit.  Cardiovascular:     Rate and Rhythm: Normal rate and  regular rhythm.     Heart sounds: No murmur heard. Pulmonary:     Effort: Pulmonary effort is normal. No respiratory distress.     Breath sounds: Normal breath sounds. No wheezing, rhonchi or rales.  Chest:     Chest wall: No tenderness.  Abdominal:     General: Abdomen is flat.     Palpations: Abdomen is soft.     Tenderness: There is no abdominal tenderness. There is no right CVA tenderness, left CVA tenderness, guarding or rebound.  Musculoskeletal:        General: No swelling or tenderness.     Cervical back: Neck supple. No tenderness.     Right  lower leg: No edema.     Left lower leg: No edema.  Skin:    General: Skin is warm and dry.     Capillary Refill: Capillary refill takes less than 2 seconds.     Findings: No erythema or rash.  Neurological:     Mental Status: She is alert.     Cranial Nerves: No dysarthria or facial asymmetry.     Sensory: Sensory deficit present.     Motor: Tremor present. No weakness or abnormal muscle tone.     Coordination: Finger-Nose-Finger Test abnormal (ataxia).     Comments: Subjective numbness to touch on left face, right arm, and left leg.  Intact strength throughout but patient subjectively reports that she feels weaker all over symmetrically.  Mild tremor in extremities when asked to do different evaluation maneuvers.  Psychiatric:        Mood and Affect: Mood normal.     ED Results / Procedures / Treatments   Labs (all labs ordered are listed, but only abnormal results are displayed) Labs Reviewed  CBC WITH DIFFERENTIAL/PLATELET - Abnormal; Notable for the following components:      Result Value   Platelets 439 (*)    All other components within normal limits  BASIC METABOLIC PANEL    EKG None  Radiology No results found.  Procedures Procedures    Medications Ordered in ED Medications - No data to display  ED Course/ Medical Decision Making/ A&P                           Medical Decision Making   Marie Brown is a 31 y.o. female with a past medical history significant for bipolar disorder, kidney stones, and migraines who presents with persistent neurologic complaints.  According to patient, for the last several months she has been having intermittent episodes of numbness in certain areas.  She reports that at 930 this morning she had onset of left face numbness, right arm numbness, and left leg numbness as well as worsened jitteriness, tremors, and generalized weakness.  She reports this is the same presentation she has had about 5 times in the last few months for  which she is getting followed by neurology.  She reports that she has her first neurology appointment next month and has been followed by cardiology as well and is reportedly getting a loop monitor soon.  She said that she had an episode 2 weeks ago that only lasted several minutes but her symptoms have been persistent since 930 this morning.  She denies a family history of MS but does report a strong family history of strokes.  She reports she had a mild headache when symptoms began at 930 this morning but then after medications it resolved.  Her  neurologic symptoms are still present.  She denies any focal weakness and denies any speech or vision changes.  She denies any other complaints on arrival including recent fevers, chills, neck pain, neck stiffness.  Denies any recent trauma.  Denies any other symptoms in her chest or abdomen or pelvis.  On exam, lungs clear and chest nontender.  Abdomen nontender.  Patient had subjective numbness in her left face, right arm, and left leg.  She had symmetric strength throughout.  She did have some ataxia with diffuse shaking with finger-nose-finger testing bilaterally but was not shaking at rest.  Pupils are symmetric and reactive known extract movements.  Intact visual fields.  Symmetric smile.  Clear speech.  Neck nontender.  No carotid bruit.  Exam otherwise unremarkable.  Due to these abnormal exam findings, I spoke with Dr. Leonel Ramsay with neurology who does recommend ED to ED transfer for MRI with and without contrast of her head and neck today.  He did not feel that CT would help today but MRI would be more beneficial for her.  If it is reassuring, she can continue her outpatient neurology follow-up plan however if concerning findings are discovered, would speak to the neurology team at [facility].  Patient will have some screening labs and will be transferred.  Spoke to Dr. Darl Householder in the emergency department who accepted the patient for ED to ED transfer for MRI and  reassessment.         Final Clinical Impression(s) / ED Diagnoses Final diagnoses:  Numbness and tingling of left side of face  Right arm numbness  Left leg numbness     Clinical Impression: 1. Numbness and tingling of left side of face   2. Right arm numbness   3. Left leg numbness     Disposition: ED to ED transfer for MRI evaluation.  If MRIs are reassuring, anticipate discharge to continue outpatient follow-up with neurology.  This note was prepared with assistance of Systems analyst. Occasional wrong-word or sound-a-like substitutions may have occurred due to the inherent limitations of voice recognition software.     Jackee Glasner, Gwenyth Allegra, MD 03/17/22 [phone]

## 2022-03-17 NOTE — ED Notes (Addendum)
Pt states that she has numbness on the entire front side of her face to include her lips. Pt states  that left side of  face felt less to touch than on the right side of the face. Pt felt less of touch on the left legs. Pt able to keep both legs up but initial bilateral drift. Pt also shows tremor in both hands with grip strength deficit in right hand.

## 2022-03-18 NOTE — ED Notes (Signed)
Patient called one final time, no response in waiting room.

## 2022-03-19 ENCOUNTER — Telehealth: Payer: Self-pay | Admitting: *Deleted

## 2022-03-19 NOTE — Patient Outreach (Signed)
Care Coordination  03/19/2022  Marie Brown June 19, 1990 [id number]   Transition Care Management Unsuccessful Follow-up Telephone Call  Date of discharge and from where:  03/17/22 eloped from [facility]  Attempts:  1st Attempt  Reason for unsuccessful TCM follow-up call:  Left voice message   [name] RN, BSN [place] RN Care Coordinator

## 2022-03-27 ENCOUNTER — Other Ambulatory Visit: Payer: Self-pay

## 2022-03-27 ENCOUNTER — Encounter (HOSPITAL_BASED_OUTPATIENT_CLINIC_OR_DEPARTMENT_OTHER): Payer: Self-pay | Admitting: Emergency Medicine

## 2022-03-27 ENCOUNTER — Emergency Department (HOSPITAL_BASED_OUTPATIENT_CLINIC_OR_DEPARTMENT_OTHER): Payer: Medicaid Other

## 2022-03-27 ENCOUNTER — Inpatient Hospital Stay (HOSPITAL_BASED_OUTPATIENT_CLINIC_OR_DEPARTMENT_OTHER)
Admission: EM | Admit: 2022-03-27 | Discharge: 2022-03-30 | DRG: 660 | Disposition: A | Discharge status: Home / Self Care | Payer: Medicaid Other | Attending: Internal Medicine | Admitting: Internal Medicine

## 2022-03-27 DIAGNOSIS — N136 Pyonephrosis: Secondary | ICD-10-CM | POA: Diagnosis not present

## 2022-03-27 DIAGNOSIS — N202 Calculus of kidney with calculus of ureter: Secondary | ICD-10-CM | POA: Diagnosis present

## 2022-03-27 DIAGNOSIS — Z8349 Family history of other endocrine, nutritional and metabolic diseases: Secondary | ICD-10-CM

## 2022-03-27 DIAGNOSIS — Z8489 Family history of other specified conditions: Secondary | ICD-10-CM

## 2022-03-27 DIAGNOSIS — F17210 Nicotine dependence, cigarettes, uncomplicated: Secondary | ICD-10-CM | POA: Diagnosis present

## 2022-03-27 DIAGNOSIS — R569 Unspecified convulsions: Secondary | ICD-10-CM | POA: Diagnosis present

## 2022-03-27 DIAGNOSIS — Z823 Family history of stroke: Secondary | ICD-10-CM

## 2022-03-27 DIAGNOSIS — Z803 Family history of malignant neoplasm of breast: Secondary | ICD-10-CM

## 2022-03-27 DIAGNOSIS — Z818 Family history of other mental and behavioral disorders: Secondary | ICD-10-CM

## 2022-03-27 DIAGNOSIS — Z820 Family history of epilepsy and other diseases of the nervous system: Secondary | ICD-10-CM

## 2022-03-27 DIAGNOSIS — D72829 Elevated white blood cell count, unspecified: Secondary | ICD-10-CM | POA: Diagnosis present

## 2022-03-27 DIAGNOSIS — B9620 Unspecified Escherichia coli [E. coli] as the cause of diseases classified elsewhere: Secondary | ICD-10-CM | POA: Diagnosis present

## 2022-03-27 DIAGNOSIS — Z3202 Encounter for pregnancy test, result negative: Secondary | ICD-10-CM | POA: Diagnosis present

## 2022-03-27 DIAGNOSIS — R9431 Abnormal electrocardiogram [ECG] [EKG]: Secondary | ICD-10-CM | POA: Diagnosis not present

## 2022-03-27 DIAGNOSIS — Z87442 Personal history of urinary calculi: Secondary | ICD-10-CM

## 2022-03-27 DIAGNOSIS — Z79899 Other long term (current) drug therapy: Secondary | ICD-10-CM

## 2022-03-27 DIAGNOSIS — Z833 Family history of diabetes mellitus: Secondary | ICD-10-CM

## 2022-03-27 DIAGNOSIS — G5600 Carpal tunnel syndrome, unspecified upper limb: Secondary | ICD-10-CM | POA: Diagnosis present

## 2022-03-27 DIAGNOSIS — F3181 Bipolar II disorder: Secondary | ICD-10-CM | POA: Diagnosis present

## 2022-03-27 DIAGNOSIS — R1032 Left lower quadrant pain: Secondary | ICD-10-CM | POA: Diagnosis not present

## 2022-03-27 DIAGNOSIS — I4711 Inappropriate sinus tachycardia, so stated: Secondary | ICD-10-CM | POA: Diagnosis present

## 2022-03-27 DIAGNOSIS — E876 Hypokalemia: Secondary | ICD-10-CM | POA: Diagnosis not present

## 2022-03-27 DIAGNOSIS — G43909 Migraine, unspecified, not intractable, without status migrainosus: Secondary | ICD-10-CM | POA: Diagnosis present

## 2022-03-27 DIAGNOSIS — Z8249 Family history of ischemic heart disease and other diseases of the circulatory system: Secondary | ICD-10-CM

## 2022-03-27 LAB — COMPREHENSIVE METABOLIC PANEL
ALT: 13 U/L (ref 0–44)
AST: 16 U/L (ref 15–41)
Albumin: 4.5 g/dL (ref 3.5–5.0)
Alkaline Phosphatase: 41 U/L (ref 38–126)
Anion gap: 11 (ref 5–15)
BUN: 14 mg/dL (ref 6–20)
CO2: 23 mmol/L (ref 22–32)
Calcium: 9.4 mg/dL (ref 8.9–10.3)
Chloride: 105 mmol/L (ref 98–111)
Creatinine, Ser: 0.95 mg/dL (ref 0.44–1.00)
GFR, Estimated: >60 mL/min (ref >60)
Glucose, Bld: 113 mg/dL — ABNORMAL HIGH (ref 70–99)
Potassium: 3.5 mmol/L (ref 3.5–5.1)
Sodium: 139 mmol/L (ref 135–145)
Total Bilirubin: 1.1 mg/dL (ref 0.3–1.2)
Total Protein: 7.7 g/dL (ref 6.5–8.1)

## 2022-03-27 LAB — PREGNANCY, URINE: Preg Test, Ur: NEGATIVE

## 2022-03-27 LAB — CBC WITH DIFFERENTIAL/PLATELET
Abs Immature Granulocytes: 0.06 10*3/uL (ref 0.00–0.07)
Basophils Absolute: 0 10*3/uL (ref 0.0–0.1)
Basophils Relative: 0 %
Eosinophils Absolute: 0 10*3/uL (ref 0.0–0.5)
Eosinophils Relative: 0 %
HCT: 38.7 % (ref 36.0–46.0)
Hemoglobin: 12.7 g/dL (ref 12.0–15.0)
Immature Granulocytes: 0 %
Lymphocytes Relative: 4 %
Lymphs Abs: 0.6 10*3/uL — ABNORMAL LOW (ref 0.7–4.0)
MCH: 31.4 pg (ref 26.0–34.0)
MCHC: 32.8 g/dL (ref 30.0–36.0)
MCV: 95.8 fL (ref 80.0–100.0)
Monocytes Absolute: 0.7 10*3/uL (ref 0.1–1.0)
Monocytes Relative: 5 %
Neutro Abs: 12.7 10*3/uL — ABNORMAL HIGH (ref 1.7–7.7)
Neutrophils Relative %: 91 %
Platelets: 326 10*3/uL (ref 150–400)
RBC: 4.04 MIL/uL (ref 3.87–5.11)
RDW: 12.7 % (ref 11.5–15.5)
WBC: 14 10*3/uL — ABNORMAL HIGH (ref 4.0–10.5)
nRBC: 0 % (ref 0.0–0.2)

## 2022-03-27 LAB — URINALYSIS, ROUTINE W REFLEX MICROSCOPIC
Bilirubin Urine: NEGATIVE
Glucose, UA: NEGATIVE mg/dL
Ketones, ur: >80 mg/dL — AB
Nitrite: POSITIVE — AB
Protein, ur: 30 mg/dL — AB
RBC / HPF: >50 RBC/hpf — ABNORMAL HIGH (ref 0–5)
Specific Gravity, Urine: 1.019 (ref 1.005–1.030)
WBC, UA: >50 WBC/hpf — ABNORMAL HIGH (ref 0–5)
pH: 7.5 (ref 5.0–8.0)

## 2022-03-27 LAB — LIPASE, BLOOD: Lipase: <10 U/L — ABNORMAL LOW (ref 11–51)

## 2022-03-27 MED ORDER — SODIUM CHLORIDE 0.9 % IV SOLN
1.0000 g | Freq: Once | INTRAVENOUS | Status: AC
  Administered 2022-03-28: 1 g via INTRAVENOUS
  Filled 2022-03-27: qty 10

## 2022-03-27 MED ORDER — KETOROLAC TROMETHAMINE 15 MG/ML IJ SOLN
15.0000 mg | Freq: Once | INTRAMUSCULAR | Status: AC
  Administered 2022-03-27: 15 mg via INTRAVENOUS
  Filled 2022-03-27: qty 1

## 2022-03-27 MED ORDER — ONDANSETRON HCL 4 MG/2ML IJ SOLN
4.0000 mg | Freq: Once | INTRAMUSCULAR | Status: AC
  Administered 2022-03-27: 4 mg via INTRAVENOUS
  Filled 2022-03-27: qty 2

## 2022-03-27 MED ORDER — SODIUM CHLORIDE 0.9 % IV BOLUS
1000.0000 mL | Freq: Once | INTRAVENOUS | Status: AC
  Administered 2022-03-27: 1000 mL via INTRAVENOUS

## 2022-03-27 MED ORDER — HYDROMORPHONE HCL 1 MG/ML IJ SOLN
1.0000 mg | Freq: Once | INTRAMUSCULAR | Status: AC
  Administered 2022-03-27: 1 mg via INTRAVENOUS
  Filled 2022-03-27: qty 1

## 2022-03-27 NOTE — ED Provider Notes (Signed)
[facility] EMERGENCY DEPT Provider Note   CSN: [id number] Arrival date & time: 03/27/22  1952     History  Chief Complaint  Patient presents with   Flank Pain    Marie Brown is a 31 y.o. female.  HPI 31 year old female presents with LLQ pain and kidney stone. Pain and vomiting started this morning. She's had similar pain in the past from a kidney stone. Has decreased urine output. No fevers. No back pain. Pain is severe. Hasn't taken anything for the pain.   On her way here, she had a seizure. She reports she was in the car and felt left facial numbness, which is a recurrent issue for her. She then remembers shaking in the back of the car. Numbness is fading away. This is a recurrent episode for her. 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 She is due to see neurology later this month.   Home Medications Prior to Admission medications   Medication Sig Start Date End Date Taking? Authorizing Provider  ibuprofen (ADVIL) 800 MG tablet Take 1 tablet (800 mg total) by mouth 3 (three) times daily. 02/02/22   Regan Lemming, MD  metoprolol succinate (TOPROL-XL) 25 MG 24 hr tablet Take 25 mg by mouth daily. 12/31/21   [provider]  ondansetron (ZOFRAN-ODT) 4 MG disintegrating tablet Take 1 tablet (4 mg total) by mouth every 8 (eight) hours as needed. 02/02/22   Regan Lemming, MD  predniSONE (STERAPRED UNI-PAK 21 TAB) 10 MG (21) TBPK tablet 10 mg DS 12 as directed 11/08/21   Mordecai Rasmussen, MD  tiZANidine (ZANAFLEX) 2 MG tablet Take by mouth. 03/05/22   [provider]  topiramate (TOPAMAX) 25 MG tablet Take 25 mg by mouth daily. 02/05/22   [provider]  traMADol (ULTRAM) 50 MG tablet Take 1 tablet (50 mg total) by mouth every 6 (six) hours as needed. 12/16/21   Mordecai Rasmussen, MD  valACYclovir (VALTREX) 1000 MG tablet Take 1,000 mg by mouth daily. 03/11/22   [provider]      Allergies     Patient has no known allergies.    Review of Systems   Review of Systems  Constitutional:  Negative for fever.  Gastrointestinal:  Positive for abdominal pain, nausea and vomiting.  Genitourinary:  Positive for decreased urine volume. Negative for dysuria.  Musculoskeletal:  Negative for back pain.    Physical Exam Updated Vital Signs BP 119/75   Pulse 83   Temp 97.8 F (36.6 C)   Resp 18   SpO2 97%  Physical Exam Vitals and nursing note reviewed.  Constitutional:      Appearance: She is well-developed. She is not ill-appearing or diaphoretic.  HENT:     Head: Normocephalic and atraumatic.  Cardiovascular:     Rate and Rhythm: Normal rate and regular rhythm.     Heart sounds: Normal heart sounds.  Pulmonary:     Effort: Pulmonary effort is normal.     Breath sounds: Normal breath sounds.  Abdominal:     Palpations: Abdomen is soft.     Tenderness: There is no abdominal tenderness. There is left CVA tenderness.  Skin:    General: Skin is warm and dry.  Neurological:     Mental Status: She is alert.     ED Results / Procedures / Treatments   Labs (all labs ordered are listed, but only abnormal results are displayed) Labs Reviewed  COMPREHENSIVE METABOLIC PANEL - Abnormal; Notable for the following components:      Result Value   Glucose, Bld 113 (*)    All other components within normal limits  CBC WITH DIFFERENTIAL/PLATELET - Abnormal; Notable for the following components:   WBC 14.0 (*)    Neutro Abs 12.7 (*)    Lymphs Abs 0.6 (*)    All other components within normal limits  LIPASE, BLOOD - Abnormal; Notable for the following components:   Lipase <10 (*)    All other components within normal limits  URINALYSIS, ROUTINE W REFLEX MICROSCOPIC - Abnormal; Notable for the following components:   APPearance HAZY (*)    Hgb urine dipstick LARGE (*)    Ketones, ur >80 (*)    Protein, ur 30 (*)    Nitrite POSITIVE (*)    Leukocytes,Ua LARGE (*)    RBC / HPF  >50 (*)    WBC, UA >50 (*)    Bacteria, UA FEW (*)    All other components within normal limits  URINE CULTURE  PREGNANCY, URINE    EKG None  Radiology No results found.  Procedures Procedures    Medications Ordered in ED Medications  cefTRIAXone (ROCEPHIN) 1 g in sodium chloride 0.9 % 100 mL IVPB (has no administration in time range)  sodium chloride 0.9 % bolus 1,000 mL (1,000 mLs Intravenous New Bag/Given 03/27/22 2201)  HYDROmorphone (DILAUDID) injection 1 mg (1 mg Intravenous Given 03/27/22 2204)  ondansetron (ZOFRAN) injection 4 mg (4 mg Intravenous Given 03/27/22 2202)  ketorolac (TORADOL) 15 MG/ML injection 15 mg (15 mg Intravenous Given 03/27/22 2206)    ED Course/ Medical Decision Making/ A&P                          Medical Decision Making Amount and/or Complexity of Data Reviewed Labs: ordered.    Details: Leukocytosis, probably reactive to vomiting.  No AKI or significant electrolyte disturbance. Radiology: ordered.  Risk Prescription drug management.   Labs show a leukocytosis.  Urine is consistent with UTI.  CT is currently pending.  Differential includes stone, UTI/pyelonephritis.  Pain seems improved with IV Dilaudid and Toradol.  No current vomiting after some Zofran.  She was given IV fluids.  I doubt she had a true seizure but either way she is following up with neurology.  The numbness is a recurrent issue and I do not think she needs emergent CNS imaging.  Otherwise, care transferred to Dr. Dayna Barker.        Final Clinical Impression(s) / ED Diagnoses Final diagnoses:  None    Rx / DC Orders ED Discharge Orders     None         Sherwood Gambler, MD 03/27/22 2341

## 2022-03-27 NOTE — ED Triage Notes (Signed)
Patient reports she was en route to the ed for kidney stone and "had a seizure in the car"  Patient is aox4 gcs 15 Reports pain in left side and face numbness,  Vomiting all day Started today, "seizures" started 6 months ago.  Took zofran at home without results  Patient is making self vomit in triage by putting fingers down throat

## 2022-03-28 ENCOUNTER — Inpatient Hospital Stay (HOSPITAL_COMMUNITY): Payer: Medicaid Other | Admitting: Certified Registered Nurse Anesthetist

## 2022-03-28 ENCOUNTER — Encounter (HOSPITAL_COMMUNITY): Payer: Self-pay | Admitting: Internal Medicine

## 2022-03-28 ENCOUNTER — Other Ambulatory Visit: Payer: Self-pay

## 2022-03-28 ENCOUNTER — Encounter (HOSPITAL_COMMUNITY)
Admission: EM | Disposition: A | Discharge status: Home / Self Care | Payer: Self-pay | Source: Home / Self Care | Attending: Internal Medicine

## 2022-03-28 ENCOUNTER — Inpatient Hospital Stay (HOSPITAL_COMMUNITY): Payer: Medicaid Other

## 2022-03-28 ENCOUNTER — Inpatient Hospital Stay: Admit: 2022-03-28 | Payer: Medicaid Other | Admitting: Urology

## 2022-03-28 DIAGNOSIS — Z833 Family history of diabetes mellitus: Secondary | ICD-10-CM | POA: Diagnosis not present

## 2022-03-28 DIAGNOSIS — G5600 Carpal tunnel syndrome, unspecified upper limb: Secondary | ICD-10-CM | POA: Diagnosis present

## 2022-03-28 DIAGNOSIS — N136 Pyonephrosis: Secondary | ICD-10-CM

## 2022-03-28 DIAGNOSIS — Z87442 Personal history of urinary calculi: Secondary | ICD-10-CM | POA: Diagnosis not present

## 2022-03-28 DIAGNOSIS — F3181 Bipolar II disorder: Secondary | ICD-10-CM | POA: Diagnosis present

## 2022-03-28 DIAGNOSIS — E876 Hypokalemia: Secondary | ICD-10-CM | POA: Diagnosis not present

## 2022-03-28 DIAGNOSIS — N201 Calculus of ureter: Secondary | ICD-10-CM | POA: Diagnosis not present

## 2022-03-28 DIAGNOSIS — I4711 Inappropriate sinus tachycardia, so stated: Secondary | ICD-10-CM | POA: Diagnosis present

## 2022-03-28 DIAGNOSIS — N390 Urinary tract infection, site not specified: Secondary | ICD-10-CM | POA: Diagnosis not present

## 2022-03-28 DIAGNOSIS — R1032 Left lower quadrant pain: Secondary | ICD-10-CM | POA: Diagnosis not present

## 2022-03-28 DIAGNOSIS — Z803 Family history of malignant neoplasm of breast: Secondary | ICD-10-CM | POA: Diagnosis not present

## 2022-03-28 DIAGNOSIS — Z8249 Family history of ischemic heart disease and other diseases of the circulatory system: Secondary | ICD-10-CM | POA: Diagnosis not present

## 2022-03-28 DIAGNOSIS — R569 Unspecified convulsions: Secondary | ICD-10-CM

## 2022-03-28 DIAGNOSIS — F319 Bipolar disorder, unspecified: Secondary | ICD-10-CM

## 2022-03-28 DIAGNOSIS — F17210 Nicotine dependence, cigarettes, uncomplicated: Secondary | ICD-10-CM

## 2022-03-28 DIAGNOSIS — Z79899 Other long term (current) drug therapy: Secondary | ICD-10-CM | POA: Diagnosis not present

## 2022-03-28 DIAGNOSIS — Z3202 Encounter for pregnancy test, result negative: Secondary | ICD-10-CM | POA: Diagnosis present

## 2022-03-28 DIAGNOSIS — Z818 Family history of other mental and behavioral disorders: Secondary | ICD-10-CM | POA: Diagnosis not present

## 2022-03-28 DIAGNOSIS — N1330 Unspecified hydronephrosis: Secondary | ICD-10-CM | POA: Diagnosis not present

## 2022-03-28 DIAGNOSIS — N202 Calculus of kidney with calculus of ureter: Secondary | ICD-10-CM | POA: Diagnosis present

## 2022-03-28 DIAGNOSIS — B9620 Unspecified Escherichia coli [E. coli] as the cause of diseases classified elsewhere: Secondary | ICD-10-CM | POA: Diagnosis not present

## 2022-03-28 DIAGNOSIS — Z823 Family history of stroke: Secondary | ICD-10-CM | POA: Diagnosis not present

## 2022-03-28 DIAGNOSIS — Z87448 Personal history of other diseases of urinary system: Secondary | ICD-10-CM

## 2022-03-28 DIAGNOSIS — R9431 Abnormal electrocardiogram [ECG] [EKG]: Secondary | ICD-10-CM | POA: Diagnosis not present

## 2022-03-28 DIAGNOSIS — D72829 Elevated white blood cell count, unspecified: Secondary | ICD-10-CM | POA: Diagnosis present

## 2022-03-28 DIAGNOSIS — Z8489 Family history of other specified conditions: Secondary | ICD-10-CM | POA: Diagnosis not present

## 2022-03-28 DIAGNOSIS — G43909 Migraine, unspecified, not intractable, without status migrainosus: Secondary | ICD-10-CM | POA: Diagnosis present

## 2022-03-28 DIAGNOSIS — Z8349 Family history of other endocrine, nutritional and metabolic diseases: Secondary | ICD-10-CM | POA: Diagnosis not present

## 2022-03-28 DIAGNOSIS — Z820 Family history of epilepsy and other diseases of the nervous system: Secondary | ICD-10-CM | POA: Diagnosis not present

## 2022-03-28 HISTORY — DX: Personal history of other diseases of urinary system: Z87.448

## 2022-03-28 HISTORY — PX: CYSTOSCOPY W/ URETERAL STENT PLACEMENT: SHX1429

## 2022-03-28 LAB — CBC
HCT: 42.5 % (ref 36.0–46.0)
Hemoglobin: 13.3 g/dL (ref 12.0–15.0)
MCH: 32 pg (ref 26.0–34.0)
MCHC: 31.3 g/dL (ref 30.0–36.0)
MCV: 102.4 fL — ABNORMAL HIGH (ref 80.0–100.0)
Platelets: 278 10*3/uL (ref 150–400)
RBC: 4.15 MIL/uL (ref 3.87–5.11)
RDW: 13.1 % (ref 11.5–15.5)
WBC: 17.6 10*3/uL — ABNORMAL HIGH (ref 4.0–10.5)
nRBC: 0 % (ref 0.0–0.2)

## 2022-03-28 LAB — BASIC METABOLIC PANEL
Anion gap: 11 (ref 5–15)
BUN: 15 mg/dL (ref 6–20)
CO2: 20 mmol/L — ABNORMAL LOW (ref 22–32)
Calcium: 8.8 mg/dL — ABNORMAL LOW (ref 8.9–10.3)
Chloride: 108 mmol/L (ref 98–111)
Creatinine, Ser: 0.94 mg/dL (ref 0.44–1.00)
GFR, Estimated: >60 mL/min (ref >60)
Glucose, Bld: 121 mg/dL — ABNORMAL HIGH (ref 70–99)
Potassium: 3.5 mmol/L (ref 3.5–5.1)
Sodium: 139 mmol/L (ref 135–145)

## 2022-03-28 LAB — HIV ANTIBODY (ROUTINE TESTING W REFLEX): HIV Screen 4th Generation wRfx: NONREACTIVE

## 2022-03-28 SURGERY — CYSTOSCOPY, WITH RETROGRADE PYELOGRAM AND URETERAL STENT INSERTION
Anesthesia: General | Laterality: Left

## 2022-03-28 MED ORDER — ONDANSETRON HCL 4 MG PO TABS: 4.0000 mg | ORAL_TABLET | Freq: Four times a day (QID) | ORAL | Status: DC | PRN

## 2022-03-28 MED ORDER — PROPOFOL 10 MG/ML IV BOLUS
INTRAVENOUS | Status: DC | PRN
  Administered 2022-03-28: 200 mg via INTRAVENOUS

## 2022-03-28 MED ORDER — ONDANSETRON HCL 4 MG/2ML IJ SOLN
4.0000 mg | Freq: Once | INTRAMUSCULAR | Status: AC
  Administered 2022-03-28: 4 mg via INTRAVENOUS
  Filled 2022-03-28: qty 2

## 2022-03-28 MED ORDER — AMISULPRIDE (ANTIEMETIC) 5 MG/2ML IV SOLN: 10.0000 mg | Freq: Once | INTRAVENOUS | Status: DC | PRN

## 2022-03-28 MED ORDER — METOPROLOL SUCCINATE ER 25 MG PO TB24
25.0000 mg | ORAL_TABLET | Freq: Every day | ORAL | Status: DC
  Administered 2022-03-28 – 2022-03-30 (×3): 25 mg via ORAL
  Filled 2022-03-28 (×3): qty 1

## 2022-03-28 MED ORDER — OXYCODONE HCL 5 MG PO TABS: 5.0000 mg | ORAL_TABLET | ORAL | Status: DC

## 2022-03-28 MED ORDER — ACETAMINOPHEN 10 MG/ML IV SOLN
INTRAVENOUS | Status: DC | PRN
  Administered 2022-03-28: 1000 mg via INTRAVENOUS

## 2022-03-28 MED ORDER — ONDANSETRON HCL 4 MG/2ML IJ SOLN
INTRAMUSCULAR | Status: AC
  Filled 2022-03-28: qty 2

## 2022-03-28 MED ORDER — MIDAZOLAM HCL 2 MG/2ML IJ SOLN
INTRAMUSCULAR | Status: AC
  Filled 2022-03-28: qty 2

## 2022-03-28 MED ORDER — LACTATED RINGERS IV SOLN: INTRAVENOUS | Status: DC

## 2022-03-28 MED ORDER — SUCCINYLCHOLINE CHLORIDE 200 MG/10ML IV SOSY
PREFILLED_SYRINGE | INTRAVENOUS | Status: AC
  Filled 2022-03-28: qty 10

## 2022-03-28 MED ORDER — FENTANYL CITRATE PF 50 MCG/ML IJ SOSY
PREFILLED_SYRINGE | INTRAMUSCULAR | Status: AC
  Filled 2022-03-28: qty 2

## 2022-03-28 MED ORDER — FENTANYL CITRATE PF 50 MCG/ML IJ SOSY
25.0000 ug | PREFILLED_SYRINGE | INTRAMUSCULAR | Status: DC | PRN
  Administered 2022-03-28 (×2): 50 ug via INTRAVENOUS

## 2022-03-28 MED ORDER — LIDOCAINE 2% (20 MG/ML) 5 ML SYRINGE
INTRAMUSCULAR | Status: DC | PRN
  Administered 2022-03-28: 100 mg via INTRAVENOUS

## 2022-03-28 MED ORDER — ACETAMINOPHEN 325 MG PO TABS: 650.0000 mg | ORAL_TABLET | Freq: Four times a day (QID) | ORAL | Status: DC | PRN

## 2022-03-28 MED ORDER — OXYCODONE HCL 5 MG PO TABS
5.0000 mg | ORAL_TABLET | ORAL | Status: DC | PRN
  Administered 2022-03-28 – 2022-03-29 (×4): 5 mg via ORAL
  Filled 2022-03-28 (×4): qty 1

## 2022-03-28 MED ORDER — OXYBUTYNIN CHLORIDE 5 MG PO TABS
5.0000 mg | ORAL_TABLET | Freq: Three times a day (TID) | ORAL | Status: DC
  Filled 2022-03-28: qty 1

## 2022-03-28 MED ORDER — METOPROLOL TARTRATE 5 MG/5ML IV SOLN
INTRAVENOUS | Status: AC
  Filled 2022-03-28: qty 5

## 2022-03-28 MED ORDER — ACETAMINOPHEN 650 MG RE SUPP: 650.0000 mg | Freq: Four times a day (QID) | RECTAL | Status: DC | PRN

## 2022-03-28 MED ORDER — ORAL CARE MOUTH RINSE: 15.0000 mL | Freq: Once | OROMUCOSAL | Status: AC

## 2022-03-28 MED ORDER — PROMETHAZINE HCL 25 MG/ML IJ SOLN: 6.2500 mg | INTRAMUSCULAR | Status: DC | PRN

## 2022-03-28 MED ORDER — OXYBUTYNIN CHLORIDE 5 MG PO TABS
5.0000 mg | ORAL_TABLET | Freq: Three times a day (TID) | ORAL | Status: DC
  Administered 2022-03-28 – 2022-03-30 (×6): 5 mg via ORAL
  Filled 2022-03-28 (×5): qty 1

## 2022-03-28 MED ORDER — MORPHINE SULFATE (PF) 4 MG/ML IV SOLN
4.0000 mg | Freq: Once | INTRAVENOUS | Status: AC
  Administered 2022-03-28: 4 mg via INTRAVENOUS
  Filled 2022-03-28: qty 1

## 2022-03-28 MED ORDER — FENTANYL CITRATE (PF) 100 MCG/2ML IJ SOLN
INTRAMUSCULAR | Status: AC
  Filled 2022-03-28: qty 2

## 2022-03-28 MED ORDER — TIZANIDINE HCL 4 MG PO TABS: 2.0000 mg | ORAL_TABLET | Freq: Four times a day (QID) | ORAL | Status: DC | PRN

## 2022-03-28 MED ORDER — TOPIRAMATE 25 MG PO TABS
25.0000 mg | ORAL_TABLET | Freq: Every day | ORAL | Status: DC
  Administered 2022-03-29 – 2022-03-30 (×2): 25 mg via ORAL
  Filled 2022-03-28 (×2): qty 1

## 2022-03-28 MED ORDER — ONDANSETRON HCL 4 MG/2ML IJ SOLN
INTRAMUSCULAR | Status: DC | PRN
  Administered 2022-03-28: 4 mg via INTRAVENOUS

## 2022-03-28 MED ORDER — DEXAMETHASONE SODIUM PHOSPHATE 10 MG/ML IJ SOLN
INTRAMUSCULAR | Status: AC
  Filled 2022-03-28: qty 1

## 2022-03-28 MED ORDER — CHLORHEXIDINE GLUCONATE 0.12 % MT SOLN
15.0000 mL | Freq: Once | OROMUCOSAL | Status: AC
  Administered 2022-03-28: 15 mL via OROMUCOSAL
  Filled 2022-03-28: qty 15

## 2022-03-28 MED ORDER — IOHEXOL 300 MG/ML  SOLN
INTRAMUSCULAR | Status: DC | PRN
  Administered 2022-03-28: 10 mL

## 2022-03-28 MED ORDER — ONDANSETRON HCL 4 MG/2ML IJ SOLN
4.0000 mg | Freq: Four times a day (QID) | INTRAMUSCULAR | Status: DC | PRN
  Administered 2022-03-30: 4 mg via INTRAVENOUS
  Filled 2022-03-28: qty 2

## 2022-03-28 MED ORDER — DEXAMETHASONE SODIUM PHOSPHATE 4 MG/ML IJ SOLN
INTRAMUSCULAR | Status: DC | PRN
  Administered 2022-03-28: 10 mg via INTRAVENOUS

## 2022-03-28 MED ORDER — PROPOFOL 10 MG/ML IV BOLUS
INTRAVENOUS | Status: AC
  Filled 2022-03-28: qty 20

## 2022-03-28 MED ORDER — METOPROLOL TARTRATE 5 MG/5ML IV SOLN
INTRAVENOUS | Status: DC | PRN
  Administered 2022-03-28: 2 mg via INTRAVENOUS
  Administered 2022-03-28: 3 mg via INTRAVENOUS

## 2022-03-28 MED ORDER — SODIUM CHLORIDE 0.9 % IV SOLN
1.0000 g | INTRAVENOUS | Status: DC
  Administered 2022-03-28 – 2022-03-29 (×2): 1 g via INTRAVENOUS
  Filled 2022-03-28 (×2): qty 10

## 2022-03-28 MED ORDER — ENOXAPARIN SODIUM 40 MG/0.4ML IJ SOSY
40.0000 mg | PREFILLED_SYRINGE | INTRAMUSCULAR | Status: DC
  Administered 2022-03-28 – 2022-03-29 (×2): 40 mg via SUBCUTANEOUS
  Filled 2022-03-28 (×2): qty 0.4

## 2022-03-28 MED ORDER — MORPHINE SULFATE (PF) 2 MG/ML IV SOLN
1.0000 mg | INTRAVENOUS | Status: DC | PRN
  Administered 2022-03-28: 1 mg via INTRAVENOUS
  Filled 2022-03-28 (×2): qty 1

## 2022-03-28 MED ORDER — FENTANYL CITRATE (PF) 100 MCG/2ML IJ SOLN
INTRAMUSCULAR | Status: DC | PRN
  Administered 2022-03-28 (×2): 50 ug via INTRAVENOUS

## 2022-03-28 MED ORDER — ACETAMINOPHEN 10 MG/ML IV SOLN
INTRAVENOUS | Status: AC
  Filled 2022-03-28: qty 100

## 2022-03-28 MED ORDER — SODIUM CHLORIDE 0.9 % IR SOLN
Status: DC | PRN
  Administered 2022-03-28: 3000 mL via INTRAVESICAL

## 2022-03-28 MED ORDER — LIDOCAINE HCL (PF) 2 % IJ SOLN
INTRAMUSCULAR | Status: AC
  Filled 2022-03-28: qty 5

## 2022-03-28 SURGICAL SUPPLY — 16 items
BAG URO CATCHER STRL LF (MISCELLANEOUS) ×1 IMPLANT
CATH URETL OPEN 5X70 (CATHETERS) IMPLANT
CLOTH BEACON ORANGE TIMEOUT ST (SAFETY) ×1 IMPLANT
GLOVE BIOGEL M 7.0 STRL (GLOVE) ×1 IMPLANT
GOWN STRL REUS W/ TWL LRG LVL3 (GOWN DISPOSABLE) ×1 IMPLANT
GOWN STRL REUS W/TWL LRG LVL3 (GOWN DISPOSABLE) ×1
GUIDEWIRE STR DUAL SENSOR (WIRE) ×1 IMPLANT
GUIDEWIRE ZIPWRE .038 STRAIGHT (WIRE) IMPLANT
MANIFOLD NEPTUNE II (INSTRUMENTS) ×1 IMPLANT
PACK CYSTO (CUSTOM PROCEDURE TRAY) ×1 IMPLANT
STENT URET 6FRX26 CONTOUR (STENTS) IMPLANT
SYR 10ML LL (SYRINGE) ×1 IMPLANT
TRAY FOL W/BAG SLVR 16FR STRL (SET/KITS/TRAYS/PACK) IMPLANT
TRAY FOLEY W/BAG SLVR 16FR LF (SET/KITS/TRAYS/PACK) ×1
TUBING CONNECTING 10 (TUBING) ×1 IMPLANT
TUBING UROLOGY SET (TUBING) IMPLANT

## 2022-03-28 NOTE — Progress Notes (Signed)
PROGRESS NOTE    Marie Brown  GQQ:[id number] DOB: May 07, 1991 DOA: 03/27/2022 PCP: Marijo File, MD    Brief Narrative:   Marie Brown is a 31 y.o. female with past medical history significant for kidney stones, migraine headaches who presented to [place] ED on 11/9 with nausea/vomiting, left-sided flank pain.  Patient reports similar pain in the past from a kidney stone has also noticed decreased urine output.  Denies fever or back pain but pain is severe in nature.    In route to the ED, patient reported possible "seizure" which has been a recurrent issue for her.  She reported some facial numbness and shaking in the back of the car.  This has been going on and off for several months, reportedly this is her sixth "seizure" and is due to see neurology later this month.  In the ED, temperature 97.8 F, HR 101, RR 22, BP 146/129, SPO2 100% on room air.  WBC 14.0, hemoglobin 12.7, platelets 326.  Sodium 139, potassium 3.5, chloride 105, CO2 23, glucose 113, BUN 14, creatinine 0.95.  AST 16, ALT 13, total bilirubin 1.1.  Lipase less than 10.  Urine pregnancy test negative.  Urinalysis with large leukocytes, positive nitrite, few bacteria, greater than 50 WBCs.  CT renal stone study with 2 obstructing 4 and 3 mm stones in the mid left ureter with associated left hydro ureter nephrosis, nonobstructing right nephrolithiasis.  Urine culture obtained.  Urology was consulted.  Hospital service consulted for further evaluation and management of pyohydronephrosis secondary to obstructive nephrolithiasis.  Assessment & Plan:   Pyohydronephrosis secondary to obstructing nephrolithiasis Patient presenting to ED with nausea/vomiting and left-sided flank pain.  Urinalysis with large leukocytes, positive nitrite, few bacteria and greater than 50 WBCs.  CT renal stone study with 2 obstructing stones measuring 4 and 3 mm in the mid left ureter with associated left hydroureteronephrosis.   Urology was consulted and patient underwent cystoscopy, left retrograde pyelogram and left ureteral stent placement by Dr. Abner Greenspan on 03/28/2022. -- Urology following, appreciate assistance -- Urine culture: Pending -- Continue Foley catheter until afebrile x24 hours per urology -- Ceftriaxone 1 g IV every 24 hours -- Oxycodone 5 mg p.o. every 4 hours as needed moderate pain -- Morphine 1 mg IV every 2 hours as needed severe pain -- Zofran as needed nausea/vomiting  Hx migraine headaches Questionable seizure-like activity Patient reports shaking and facial numbness in route to the ED, has been a recurrent issue for several months.  Patient reports this is her sixth "seizure".  Has plan to see neurology later this month.  Suspect her symptoms were likely related to her infection with obstructing nephrolithiasis as above. -- Continue Topamax -- Seizure precautions -- If has recurrent seizure while inpatient, will consult neurology and obtain EEG; otherwise plan outpatient follow-up with neurology  Hx inappropriate sinus tachycardia Has been seen by cardiology outpatient [facility]. --Continue metoprolol succinate 25 mg p.o. daily -- Continue to monitor on telemetry   DVT prophylaxis: enoxaparin (LOVENOX) injection 40 mg Start: 03/28/22 1600    Code Status: Full Code Family Communication:   Disposition Plan:  Level of care: Progressive Status is: Inpatient Remains inpatient appropriate because: IV antibiotics, awaiting urine culture identification/susceptibilities for transition to oral antibiotics per urology, anticipate discharge home in 2-3 days    Consultants:  Allergy, Dr. Abner Greenspan  Procedures:  Cystoscopy with left ureteral stent placement, Dr. Abner Greenspan with 11/10  Antimicrobials:  Ceftriaxone 11/9   Subjective: Patient seen  examined bedside, resting comfortably.  Seen in PACU following cystoscopy with left ureteral stent placement.  Continues with mild nausea and left flank  discomfort.  No other specific complaints or concerns at this time.  Denies headache, no visual changes, no chest pain, no palpitations, no shortness of breath, no current fever/chills/night sweats, no vomiting/diarrhea, no cough/congestion, no fatigue, no focal weakness, no paresthesias.  No acute concerns per PACU nurse this morning.  Objective: Vitals:   03/28/22 0845 03/28/22 1000 03/28/22 1100 03/28/22 1228  BP: 116/89 117/79 112/65 125/74  Pulse: 84 69 73 69  Resp: '18 15 16   '$ Temp: 99 F (37.2 C) 99.3 F (37.4 C) 99.3 F (37.4 C) 98 F (36.7 C)  TempSrc:    Oral  SpO2: 97% 97% 98% 98%  Weight:      Height:        Intake/Output Summary (Last 24 hours) at 03/28/2022 1314 Last data filed at 03/28/2022 1252 Gross per 24 hour  Intake 1400 ml  Output 300 ml  Net 1100 ml   Filed Weights   03/28/22 0634  Weight: 74.8 kg    Examination:  Physical Exam: GEN: NAD, alert and oriented x 3, wd/wn HEENT: NCAT, PERRL, EOMI, sclera clear, MMM PULM: CTAB w/o wheezes/crackles, normal respiratory effort CV: RRR w/o M/G/R GI: abd soft, NTND, NABS, no R/G/M MSK: no peripheral edema, muscle strength globally intact 5/5 bilateral upper/lower extremities NEURO: CN II-XII intact, no focal deficits, sensation to light touch intact PSYCH: normal mood/affect Integumentary: dry/intact, no rashes or wounds    Data Reviewed: I have personally reviewed following labs and imaging studies  CBC: Recent Labs  Lab 03/27/22 2158 03/28/22 0510  WBC 14.0* 17.6*  NEUTROABS 12.7*  --   HGB 12.7 13.3  HCT 38.7 42.5  MCV 95.8 102.4*  PLT 326 696   Basic Metabolic Panel: Recent Labs  Lab 03/27/22 2158 03/28/22 0510  NA 139 139  K 3.5 3.5  CL 105 108  CO2 23 20*  GLUCOSE 113* 121*  BUN 14 15  CREATININE 0.95 0.94  CALCIUM 9.4 8.8*   GFR: Estimated Creatinine Clearance: 83.3 mL/min (by C-G formula based on SCr of 0.94 mg/dL). Liver Function Tests: Recent Labs  Lab 03/27/22 2158   AST 16  ALT 13  ALKPHOS 41  BILITOT 1.1  PROT 7.7  ALBUMIN 4.5   Recent Labs  Lab 03/27/22 2158  LIPASE <10*   No results for input(s): "AMMONIA" in the last 168 hours. Coagulation Profile: No results for input(s): "INR", "PROTIME" in the last 168 hours. Cardiac Enzymes: No results for input(s): "CKTOTAL", "CKMB", "CKMBINDEX", "TROPONINI" in the last 168 hours. BNP (last 3 results) No results for input(s): "PROBNP" in the last 8760 hours. HbA1C: No results for input(s): "HGBA1C" in the last 72 hours. CBG: No results for input(s): "GLUCAP" in the last 168 hours. Lipid Profile: No results for input(s): "CHOL", "HDL", "LDLCALC", "TRIG", "CHOLHDL", "LDLDIRECT" in the last 72 hours. Thyroid Function Tests: No results for input(s): "TSH", "T4TOTAL", "FREET4", "T3FREE", "THYROIDAB" in the last 72 hours. Anemia Panel: No results for input(s): "VITAMINB12", "FOLATE", "FERRITIN", "TIBC", "IRON", "RETICCTPCT" in the last 72 hours. Sepsis Labs: No results for input(s): "PROCALCITON", "LATICACIDVEN" in the last 168 hours.  No results found for this or any previous visit (from the past 240 hour(s)).       Radiology Studies: DG C-Arm 1-60 Min-No Report  Result Date: 03/28/2022 Fluoroscopy was utilized by the requesting physician.  No radiographic interpretation.  CT Renal Stone Study  Result Date: 03/27/2022 CLINICAL DATA:  Kidney stone and seizure in car EXAM: CT ABDOMEN AND PELVIS WITHOUT CONTRAST TECHNIQUE: Multidetector CT imaging of the abdomen and pelvis was performed following the standard protocol without IV contrast. RADIATION DOSE REDUCTION: This exam was performed according to the departmental dose-optimization program which includes automated exposure control, adjustment of the mA and/or kV according to patient size and/or use of iterative reconstruction technique. COMPARISON:  CT 02/02/2022 FINDINGS: Lower chest: No acute abnormality. Hepatobiliary: Sludge in the  gallbladder. No biliary dilation. No focal hepatic lesion. Pancreas: Unremarkable. Spleen: Unremarkable. Adrenals/Urinary Tract: Tiny non-obstructing right renal calculi. No right hydronephrosis. Edematous left kidney with moderate left hydroureteronephrosis. Small amount of perirenal fluid. Obstructing stones in the mid left ureter measuring 4 and 3 mm respectively. Decompressed bladder. Unremarkable adrenal glands. Stomach/Bowel: Unremarkable stomach. Normal caliber large and small bowel. Appendectomy. Vascular/Lymphatic: No significant vascular findings are present. No enlarged abdominal or pelvic lymph nodes. Reproductive: Unremarkable. Other: No free intraperitoneal air. Musculoskeletal: No acute or significant osseous findings. IMPRESSION: Two obstructing 4 and 3 mm stones in the mid left ureter with associated left hydroureteronephrosis. Nonobstructing right nephrolithiasis. Electronically Signed   By: Placido Sou M.D.   On: 03/27/2022 23:40        Scheduled Meds:  enoxaparin (LOVENOX) injection  40 mg Subcutaneous Q24H   topiramate  25 mg Oral Daily   Continuous Infusions:  cefTRIAXone (ROCEPHIN)  IV     lactated ringers 125 mL/hr at 03/28/22 1245     LOS: 0 days    Time spent: 52 minutes spent on chart review, discussion with nursing staff, consultants, updating family and interview/physical exam; more than 50% of that time was spent in counseling and/or coordination of care.    Aeric Burnham J [place] DO [facility] Available via Epic secure chat 7am-7pm After these hours, please refer to coverage provider listed on [email or web address] 03/28/2022, 1:14 PM

## 2022-03-28 NOTE — Transfer of Care (Signed)
Immediate Anesthesia Transfer of Care Note  Patient: Marie Brown  Procedure(s) Performed: CYSTOSCOPY WITH RETROGRADE PYELOGRAM/URETERAL STENT PLACEMENT (Left)  Patient Location: PACU  Anesthesia Type:General  Level of Consciousness: awake and patient cooperative  Airway & Oxygen Therapy: Patient Spontanous Breathing and Patient connected to face mask  Post-op Assessment: Report given to RN and Post -op Vital signs reviewed and stable  Post vital signs: Reviewed and stable  Last Vitals:  Vitals Value Taken Time  BP 122/81 03/28/22 0747  Temp    Pulse 88 03/28/22 0748  Resp 25 03/28/22 0748  SpO2 100 % 03/28/22 0748  Vitals shown include unvalidated device data.  Last Pain:  Vitals:   03/28/22 0634  TempSrc:   PainSc: 10-Worst pain ever      Patients Stated Pain Goal: 3 (15/37/94 3276)  Complications: No notable events documented.

## 2022-03-28 NOTE — Anesthesia Procedure Notes (Signed)
Procedure Name: LMA Insertion Date/Time: 03/28/2022 7:18 AM  Performed by: Claudia Desanctis, CRNAPre-anesthesia Checklist: Emergency Drugs available, Patient identified, Suction available and Patient being monitored Patient Re-evaluated:Patient Re-evaluated prior to induction Oxygen Delivery Method: Circle system utilized Preoxygenation: Pre-oxygenation with 100% oxygen Induction Type: IV induction Ventilation: Mask ventilation without difficulty LMA: LMA inserted LMA Size: 4.0 Number of attempts: 1 Placement Confirmation: positive ETCO2 and breath sounds checked- equal and bilateral Tube secured with: Tape Dental Injury: Teeth and Oropharynx as per pre-operative assessment

## 2022-03-28 NOTE — H&P (Signed)
History and Physical    Patient: Marie Brown AJO:[id number] DOB: May 12, 1991 DOA: 03/27/2022 DOS: the patient was seen and examined on 03/28/2022 PCP: Marijo File, MD  Patient coming from: Home  Chief Complaint:  Chief Complaint  Patient presents with   Flank Pain   HPI: Marie Brown is a 31 y.o. female with medical history significant of kidney stones.  Pt in to ED tonight at med center with c/o L flank pain.  Found to have UTI on UA, WBC 14k, and 2 obstructing L ureteral stones with L pyohydronephrosis.  Pt transferred to [facility] ED for urology consult and likely stenting.  On her way here, she reports she had a "seizure". She reports she was in the car and felt left facial numbness, which is a recurrent issue for her. She then remembers shaking in the back of the car. Numbness is fading away. This is a recurrent episode for her. 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 She is due to see neurology later this month.  On arrival, pt not septic yet (only 1/4 SIRS).  Given rocephin in ED, put on IVF.  Urology seeing in consult and hospitalist asked to admit.   Review of Systems: As mentioned in the history of present illness. All other systems reviewed and are negative. Past Medical History:  Diagnosis Date   Bipolar 2 disorder ([place])    Carpal tunnel syndrome    Kidney stones    Migraine    Past Surgical History:  Procedure Laterality Date   APPENDECTOMY     FOOT SURGERY Bilateral 2005   Social History:  reports that she has been smoking cigarettes. She has a 1.50 pack-year smoking history. She has never used smokeless tobacco. She reports current alcohol use. She reports that she does not use drugs.  No Known Allergies  Family History  Problem Relation Age of Onset   Hypertension Mother    Diabetes Other    Thyroid disease Other    Heart disease Other    Birth defects Other    Mental illness Other    Stroke Other    Breast  cancer Other    Graves' disease Maternal Grandmother    Multiple sclerosis Maternal Grandfather    Suicidality Father     Prior to Admission medications   Medication Sig Start Date End Date Taking? Authorizing Provider  ibuprofen (ADVIL) 800 MG tablet Take 1 tablet (800 mg total) by mouth 3 (three) times daily. 02/02/22   Regan Lemming, MD  metoprolol succinate (TOPROL-XL) 25 MG 24 hr tablet Take 25 mg by mouth daily. 12/31/21   [provider]  ondansetron (ZOFRAN-ODT) 4 MG disintegrating tablet Take 1 tablet (4 mg total) by mouth every 8 (eight) hours as needed. 02/02/22   Regan Lemming, MD  predniSONE (STERAPRED UNI-PAK 21 TAB) 10 MG (21) TBPK tablet 10 mg DS 12 as directed 11/08/21   Mordecai Rasmussen, MD  tiZANidine (ZANAFLEX) 2 MG tablet Take by mouth. 03/05/22   [provider]  topiramate (TOPAMAX) 25 MG tablet Take 25 mg by mouth daily. 02/05/22   [provider]  traMADol (ULTRAM) 50 MG tablet Take 1 tablet (50 mg total) by mouth every 6 (six) hours as needed. 12/16/21   Mordecai Rasmussen, MD  valACYclovir (VALTREX) 1000 MG tablet Take 1,000 mg by mouth daily. 03/11/22   [provider]    Physical Exam: Vitals:   03/28/22 0245 03/28/22  0308 03/28/22 0315 03/28/22 0330  BP: 130/83 125/72 128/78 132/88  Pulse: 86 96 91 95  Resp: 17 18    Temp:  98.6 F (37 C)    TempSrc:  Oral    SpO2: 100% 96% 100% 99%   Constitutional: NAD, calm, comfortable Eyes: PERRL, lids and conjunctivae normal ENMT: Mucous membranes are moist. Posterior pharynx clear of any exudate or lesions.Normal dentition.  Neck: normal, supple, no masses, no thyromegaly Respiratory: clear to auscultation bilaterally, no wheezing, no crackles. Normal respiratory effort. No accessory muscle use.  Cardiovascular: Regular rate and rhythm, no murmurs / rubs / gallops. No extremity edema. 2+ pedal pulses. No carotid bruits.  Abdomen: L CVA tenderness Musculoskeletal: no clubbing /  cyanosis. No joint deformity upper and lower extremities. Good ROM, no contractures. Normal muscle tone.  Skin: no rashes, lesions, ulcers. No induration Neurologic: CN 2-12 grossly intact. Sensation intact, DTR normal. Strength 5/5 in all 4.  Psychiatric: Normal judgment and insight. Alert and oriented x 3. Normal mood.   Data Reviewed:    CBC    Component Value Date/Time   WBC 14.0 (H) 03/27/2022 2158   RBC 4.04 03/27/2022 2158   HGB 12.7 03/27/2022 2158   HCT 38.7 03/27/2022 2158   PLT 326 03/27/2022 2158   MCV 95.8 03/27/2022 2158   MCH 31.4 03/27/2022 2158   MCHC 32.8 03/27/2022 2158   RDW 12.7 03/27/2022 2158   LYMPHSABS 0.6 (L) 03/27/2022 2158   MONOABS 0.7 03/27/2022 2158   EOSABS 0.0 03/27/2022 2158   BASOSABS 0.0 03/27/2022 2158   CMP     Component Value Date/Time   NA 139 03/27/2022 2158   K 3.5 03/27/2022 2158   CL 105 03/27/2022 2158   CO2 23 03/27/2022 2158   GLUCOSE 113 (H) 03/27/2022 2158   BUN 14 03/27/2022 2158   CREATININE 0.95 03/27/2022 2158   CALCIUM 9.4 03/27/2022 2158   PROT 7.7 03/27/2022 2158   ALBUMIN 4.5 03/27/2022 2158   AST 16 03/27/2022 2158   ALT 13 03/27/2022 2158   ALKPHOS 41 03/27/2022 2158   BILITOT 1.1 03/27/2022 2158   GFRNONAA >60 03/27/2022 2158   GFRAA >60 08/26/2019 1425   Urinalysis    Component Value Date/Time   COLORURINE YELLOW 03/27/2022 2314   APPEARANCEUR HAZY (A) 03/27/2022 2314   LABSPEC 1.019 03/27/2022 2314   PHURINE 7.5 03/27/2022 2314   GLUCOSEU NEGATIVE 03/27/2022 2314   HGBUR LARGE (A) 03/27/2022 2314   HGBUR trace-intact 01/17/2009 0812   BILIRUBINUR NEGATIVE 03/27/2022 2314   KETONESUR >80 (A) 03/27/2022 2314   PROTEINUR 30 (A) 03/27/2022 2314   UROBILINOGEN 0.2 03/15/2015 1008   NITRITE POSITIVE (A) 03/27/2022 2314   LEUKOCYTESUR LARGE (A) 03/27/2022 2314    CT AP: IMPRESSION: Two obstructing 4 and 3 mm stones in the mid left ureter with associated left hydroureteronephrosis.    Nonobstructing right nephrolithiasis.  Assessment and Plan: * Pyohydronephrosis UTI + obstructing ureteral stones (2 of them) on L with hydronephrosis. Urology seeing in consult, presumably will need to take pt to OR urgently / emergently for ureteral stent placement. Empiric rocephin UCx pending Tele monitor IVF: LR at 125 Admit to progressive care post op  Seizure-like activity ([place]) Patient's "seizure" as described in HPI above. Certainly not a grand-mal seizure from the sounds of it.  Partial seizure vs PNES? Scheduled to see neurology as outpt later this month. Will hold off on ordering any specific treatment for the moment and just observe. Pyohydronephrosis  seems to be her bigger issue at the moment.      Advance Care Planning:   Code Status: Full Code  Consults: EDP spoke with urology, looks like they are seeing in consult / resident putting in note  Family Communication: Family at bedside  Severity of Illness: The appropriate patient status for this patient is INPATIENT. Inpatient status is judged to be reasonable and necessary in order to provide the required intensity of service to ensure the patient's safety. The patient's presenting symptoms, physical exam findings, and initial radiographic and laboratory data in the context of their chronic comorbidities is felt to place them at high risk for further clinical deterioration. Furthermore, it is not anticipated that the patient will be medically stable for discharge from the hospital within 2 midnights of admission.   * I certify that at the point of admission it is my clinical judgment that the patient will require inpatient hospital care spanning beyond 2 midnights from the point of admission due to high intensity of service, high risk for further deterioration and high frequency of surveillance required.*  Author: Etta Quill., DO 03/28/2022 3:35 AM  For on call review [email or web address].

## 2022-03-28 NOTE — Op Note (Signed)
Operative Note  Preoperative diagnosis:  1.  Left ureteral stone 2. Urinary tract infection  Postoperative diagnosis: 1.  Left ureteral stone 2. Urinary tract infection  Procedure(s): 1.  Cystoscopy 2. Left retrograde pyelogram with interpretation 3. Left ureteral stent placement 4. Fluoroscopy <1 hour with intraoperative interpretation  Surgeon: Rexene Alberts, MD  Assistants:  None  Anesthesia:  General  Complications:  None  EBL:  Minimal  Specimens: 1.  ID Type Source Tests Collected by Time Destination  A : left renal pelvis urine culture Urine Urine, Cystoscope ANAEROBIC CULTURE W Leana Gamer Janith Lima, MD 03/28/2022 [phone]     Drains/Catheters: 1.  Left 6Fr x 26cm ureteral stent 2. 16 Fr foley catheter  Intraoperative findings:   Cystoscopy demonstrated no suspicious lesions, masses, stones or other pathology. Left retrograde pyelogram demonstrated moderate hydronephrosis, hydronephrotic drip with cloudy urine. Successful left ureteral stent placement with curl in the renal pelvis and bladder respectively.  Indication:  Marie Brown is a 31 y.o. female with obstructing left ureteral stones here. After reviewing the management options for treatment, she elected to proceed with the above surgical procedure(s). We have discussed the potential benefits and risks of the procedure, side effects of the proposed treatment, the likelihood of the patient achieving the goals of the procedure, and any potential problems that might occur during the procedure or recuperation. Informed consent has been obtained.  Description of procedure: The patient was taken to the operating room and general anesthesia was induced.  The patient was placed in the dorsal lithotomy position, prepped and draped in the usual sterile fashion, and preoperative antibiotics were administered. A preoperative time-out was performed.   Cystourethroscopy was performed.  The patient's urethra was  examined and was normal. The bladder was then systematically examined in its entirety. There was no evidence for any bladder tumors, stones, or other mucosal pathology.    Attention then turned to the left ureteral orifice. A 0.038 zip wire was passed through the left  orifice and over the wire a 5 Fr open ended catheter was inserted and passed up to the level of the renal pelvis. Aspirate was obtained and sent off as left renal pelvis urine for culture. There was a hydronephrotic drip of cloudy urine. Omnipaque contrast was injected through the ureteral catheter and a retrograde pyelogram was performed with findings as dictated above. The wire was then replaced and the open ended catheter was removed.   A 6Fr x 26cm ureteral stent was advance over the wire. The stent was positioned appropriately under fluoroscopic and cystoscopic guidance.  The wire was then removed with an adequate stent curl noted in the renal pelvis as well as in the bladder.  The bladder was then emptied and the procedure ended.  The patient appeared to tolerate the procedure well and without complications.  She did develop a fever of 38C. A foley catheter was placed. The patient was able to be awakened and transferred to the recovery unit in satisfactory condition.   Plan:  Leave foley until afebrile for 24 hours. F/u renal pelvis urine cultures. Will require culture specific antibiotics at discharge. Arranged followup in the office.  Matt R. [facility]  Pager: [phone]

## 2022-03-28 NOTE — Anesthesia Preprocedure Evaluation (Addendum)
Anesthesia Evaluation  Patient identified by MRN, date of birth, ID band Patient awake    Reviewed: Allergy & Precautions, NPO status , Patient's Chart, lab work & pertinent test results  History of Anesthesia Complications Negative for: history of anesthetic complications  Airway Mallampati: II  TM Distance: >3 FB Neck ROM: Full    Dental no notable dental hx. (+) Dental Advisory Given   Pulmonary Current Smoker   Pulmonary exam normal        Cardiovascular negative cardio ROS Normal cardiovascular exam  Echocardiogram showed normal LVEF, borderline mitral valve prolapse with trace mitral regurgitation. Her Holter monitor was benign with low PACs and PVCs burden and evidence of inappropriate sinus tachycardia.   Neuro/Psych  PSYCHIATRIC DISORDERS   Bipolar Disorder   Cavernoma negative neurological ROS     GI/Hepatic negative GI ROS, Neg liver ROS,,,  Endo/Other  negative endocrine ROS    Renal/GU Renal disease     Musculoskeletal negative musculoskeletal ROS (+)    Abdominal   Peds  Hematology negative hematology ROS (+)   Anesthesia Other Findings   Reproductive/Obstetrics                             Anesthesia Physical Anesthesia Plan  ASA: 3  Anesthesia Plan: General   Post-op Pain Management: Tylenol PO (pre-op)*   Induction: Intravenous  PONV Risk Score and Plan: 2 and Ondansetron and Dexamethasone  Airway Management Planned: LMA  Additional Equipment:   Intra-op Plan:   Post-operative Plan: Extubation in OR  Informed Consent: I have reviewed the patients History and Physical, chart, labs and discussed the procedure including the risks, benefits and alternatives for the proposed anesthesia with the patient or authorized representative who has indicated his/her understanding and acceptance.     Dental advisory given  Plan Discussed with: Anesthesiologist and  CRNA  Anesthesia Plan Comments:        Anesthesia Quick Evaluation

## 2022-03-28 NOTE — ED Provider Notes (Signed)
12:36 AM Assumed care from Dr. Regenia Skeeter, please see their note for full history, physical and decision making until this point. In brief this is a 31 y.o. year old female who presented to the ED tonight with Flank Pain     Has UTI. Pain improved. Symptoms improved. Pending CT to eval for pyelo vs stone (h/o same) vs infected stone. Not septic.   CT with e/o two obstructing zone. Has leukocytosis. On reeval symptoms still well controlled but with UTI and obstructed stone, will d/w Uro for management options.   Discussed with Dr. Abner Greenspan with urology who recommends transfer to [facility] for urology evaluation and likely stenting.   Discussed with Dr. Matilde Sprang who accepts to ED for urology consultation. Patient updated and agrees with the plan.  Labs, studies and imaging reviewed by myself and considered in medical decision making if ordered. Imaging interpreted by radiology.  Labs Reviewed  COMPREHENSIVE METABOLIC PANEL - Abnormal; Notable for the following components:      Result Value   Glucose, Bld 113 (*)    All other components within normal limits  CBC WITH DIFFERENTIAL/PLATELET - Abnormal; Notable for the following components:   WBC 14.0 (*)    Neutro Abs 12.7 (*)    Lymphs Abs 0.6 (*)    All other components within normal limits  LIPASE, BLOOD - Abnormal; Notable for the following components:   Lipase <10 (*)    All other components within normal limits  URINALYSIS, ROUTINE W REFLEX MICROSCOPIC - Abnormal; Notable for the following components:   APPearance HAZY (*)    Hgb urine dipstick LARGE (*)    Ketones, ur >80 (*)    Protein, ur 30 (*)    Nitrite POSITIVE (*)    Leukocytes,Ua LARGE (*)    RBC / HPF >50 (*)    WBC, UA >50 (*)    Bacteria, UA FEW (*)    All other components within normal limits  URINE CULTURE  PREGNANCY, URINE    CT Renal Stone Study  Final Result      No follow-ups on file.    Marie Brown, Corene Cornea, MD 03/28/22 [phone]

## 2022-03-28 NOTE — Assessment & Plan Note (Addendum)
UTI + obstructing ureteral stones (2 of them) on L with hydronephrosis. Urology seeing in consult, presumably will need to take pt to OR urgently / emergently for ureteral stent placement. Empiric rocephin UCx pending Tele monitor IVF: LR at 125 Admit to progressive care post op

## 2022-03-28 NOTE — Assessment & Plan Note (Signed)
Patient's "seizure" as described in HPI above. Certainly not a grand-mal seizure from the sounds of it.  Partial seizure vs PNES? Scheduled to see neurology as outpt later this month. Will hold off on ordering any specific treatment for the moment and just observe. Pyohydronephrosis seems to be her bigger issue at the moment.

## 2022-03-28 NOTE — Anesthesia Postprocedure Evaluation (Signed)
Anesthesia Post Note  Patient: Marie Brown  Procedure(s) Performed: CYSTOSCOPY WITH RETROGRADE PYELOGRAM/URETERAL STENT PLACEMENT (Left)     Patient location during evaluation: PACU Anesthesia Type: General Level of consciousness: sedated Pain management: pain level controlled Vital Signs Assessment: post-procedure vital signs reviewed and stable Respiratory status: spontaneous breathing and respiratory function stable Cardiovascular status: stable Postop Assessment: no apparent nausea or vomiting Anesthetic complications: no  No notable events documented.  Last Vitals:  Vitals:   03/28/22 0830 03/28/22 0845  BP: 110/63 116/89  Pulse: 86 84  Resp: 14 18  Temp:  37.2 C  SpO2: 95% 97%    Last Pain:  Vitals:   03/28/22 0845  TempSrc:   PainSc: 4                  Sheray Grist DANIEL

## 2022-03-28 NOTE — ED Provider Notes (Signed)
  Physical Exam  BP 125/74   Pulse 69   Temp 98 F (36.7 C) (Oral)   Resp 16   Ht '5\' 3"'$  (1.6 m)   Wt 74.8 kg   SpO2 98%   BMI 29.23 kg/m   Physical Exam Vitals and nursing note reviewed.  Constitutional:      General: She is not in acute distress.    Appearance: She is well-developed.  HENT:     Head: Normocephalic and atraumatic.  Eyes:     Conjunctiva/sclera: Conjunctivae normal.  Cardiovascular:     Rate and Rhythm: Normal rate and regular rhythm.     Heart sounds: No murmur heard. Pulmonary:     Effort: Pulmonary effort is normal. No respiratory distress.  Abdominal:     Tenderness: There is left CVA tenderness.  Musculoskeletal:        General: No swelling.     Cervical back: Neck supple.  Skin:    General: Skin is warm and dry.     Capillary Refill: Capillary refill takes less than 2 seconds.  Neurological:     Mental Status: She is alert.  Psychiatric:        Mood and Affect: Mood normal.     Procedures  Procedures  ED Course / MDM   Clinical Course as of 03/28/22 1657  Thu Mar 27, 2022  2333 CT Renal Laren Everts [JM]    Clinical Course User Index [JM] Mesner, Corene Cornea, MD   Medical Decision Making Amount and/or Complexity of Data Reviewed Labs: ordered. Radiology: ordered. Decision-making details documented in ED Course.  Risk Prescription drug management. Decision regarding hospitalization.   Patient received in handoff.  Septic stone requiring stenting transferred from outside emergency department.  Spoke with Dr. Abner Greenspan and the urology resident on-call.  Requesting medical admission and patient will go to the operating room at 7:30 AM.  Patient admitted       Teressa Lower, MD 03/28/22 [phone]

## 2022-03-28 NOTE — Consult Note (Addendum)
Urology Consult   Physician requesting consult: Jennette Kettle, DO  Reason for consult: ureterolithiasis  History of Present Illness: Marie Brown is a 31 y.o. female with a PMH of bipolar 2 disorder, migraines, and nephrolithiasis who presented to the ED with LLQ and vomiting since yesterday morning. Work-up in the ED notable for obstructing left ureterolithiasis and non-obstructing right nephrolithiasis. Patient reports having seizure in the car on the way to the hospital, noting she has had multiple seizures over the last 6 months.   Patient denies fever, chills. Does report feeling warm but no fevers. She denies dysuria, hematuria, urgency, frequency. She reports she is voiding less frequently.  Patient has a history of stones, last in September 2023, which she thinks she passed spontaneously.  Past Medical History:  Diagnosis Date   Bipolar 2 disorder ([place])    Carpal tunnel syndrome    Kidney stones    Migraine     Past Surgical History:  Procedure Laterality Date   APPENDECTOMY     FOOT SURGERY Bilateral 2005     Current Hospital Medications:  Home meds:  No current facility-administered medications on file prior to encounter.   Current Outpatient Medications on File Prior to Encounter  Medication Sig Dispense Refill   ibuprofen (ADVIL) 800 MG tablet Take 1 tablet (800 mg total) by mouth 3 (three) times daily. 21 tablet 0   metoprolol succinate (TOPROL-XL) 25 MG 24 hr tablet Take 25 mg by mouth daily.     ondansetron (ZOFRAN-ODT) 4 MG disintegrating tablet Take 1 tablet (4 mg total) by mouth every 8 (eight) hours as needed. 20 tablet 0   predniSONE (STERAPRED UNI-PAK 21 TAB) 10 MG (21) TBPK tablet 10 mg DS 12 as directed 48 tablet 0   tiZANidine (ZANAFLEX) 2 MG tablet Take by mouth.     topiramate (TOPAMAX) 25 MG tablet Take 25 mg by mouth daily.     traMADol (ULTRAM) 50 MG tablet Take 1 tablet (50 mg total) by mouth every 6 (six) hours as needed. 20 tablet 0    valACYclovir (VALTREX) 1000 MG tablet Take 1,000 mg by mouth daily.       Scheduled Meds:  enoxaparin (LOVENOX) injection  40 mg Subcutaneous Q24H   Continuous Infusions:  cefTRIAXone (ROCEPHIN)  IV     lactated ringers 125 mL/hr at 03/28/22 0321   PRN Meds:.acetaminophen **OR** acetaminophen, ondansetron **OR** ondansetron (ZOFRAN) IV  Allergies: No Known Allergies  Family History  Problem Relation Age of Onset   Hypertension Mother    Diabetes Other    Thyroid disease Other    Heart disease Other    Birth defects Other    Mental illness Other    Stroke Other    Breast cancer Other    Graves' disease Maternal Grandmother    Multiple sclerosis Maternal Grandfather    Suicidality Father     Social History:  reports that she has been smoking cigarettes. She has a 1.50 pack-year smoking history. She has never used smokeless tobacco. She reports current alcohol use. She reports that she does not use drugs.  ROS: A complete review of systems was performed.  All systems are negative except for pertinent findings as noted.  Physical Exam:  Vital signs in last 24 hours: Temp:  [97.8 F (36.6 C)-98.6 F (37 C)] 98.6 F (37 C) (11/10 0308) Pulse Rate:  [70-101] 95 (11/10 0330) Resp:  [15-22] 18 (11/10 0308) BP: (106-146)/(64-129) 132/88 (11/10 0330) SpO2:  [95 %-100 %] 99 % (  11/10 0330) Constitutional:  Alert and oriented, No acute distress Cardiovascular: Regular rate and rhythm, No JVD Respiratory: Normal respiratory effort, Lungs clear bilaterally GI: Abdomen is soft, nontender, nondistended, no abdominal masses GU: No CVA tenderness Lymphatic: No lymphadenopathy Neurologic: Grossly intact, no focal deficits Psychiatric: Normal mood and affect  Laboratory Data:  Recent Labs    03/27/22 2158  WBC 14.0*  HGB 12.7  HCT 38.7  PLT 326    Recent Labs    03/27/22 2158  NA 139  K 3.5  CL 105  GLUCOSE 113*  BUN 14  CALCIUM 9.4  CREATININE 0.95     Results  for orders placed or performed during the hospital encounter of 03/27/22 (from the past 24 hour(s))  Comprehensive metabolic panel     Status: Abnormal   Collection Time: 03/27/22  9:58 PM  Result Value Ref Range   Sodium 139 135 - 145 mmol/L   Potassium 3.5 3.5 - 5.1 mmol/L   Chloride 105 98 - 111 mmol/L   CO2 23 22 - 32 mmol/L   Glucose, Bld 113 (H) 70 - 99 mg/dL   BUN 14 6 - 20 mg/dL   Creatinine, Ser 0.95 0.44 - 1.00 mg/dL   Calcium 9.4 8.9 - 10.3 mg/dL   Total Protein 7.7 6.5 - 8.1 g/dL   Albumin 4.5 3.5 - 5.0 g/dL   AST 16 15 - 41 U/L   ALT 13 0 - 44 U/L   Alkaline Phosphatase 41 38 - 126 U/L   Total Bilirubin 1.1 0.3 - 1.2 mg/dL   GFR, Estimated >60 >60 mL/min   Anion gap 11 5 - 15  CBC with Differential     Status: Abnormal   Collection Time: 03/27/22  9:58 PM  Result Value Ref Range   WBC 14.0 (H) 4.0 - 10.5 K/uL   RBC 4.04 3.87 - 5.11 MIL/uL   Hemoglobin 12.7 12.0 - 15.0 g/dL   HCT 38.7 36.0 - 46.0 %   MCV 95.8 80.0 - 100.0 fL   MCH 31.4 26.0 - 34.0 pg   MCHC 32.8 30.0 - 36.0 g/dL   RDW 12.7 11.5 - 15.5 %   Platelets 326 150 - 400 K/uL   nRBC 0.0 0.0 - 0.2 %   Neutrophils Relative % 91 %   Neutro Abs 12.7 (H) 1.7 - 7.7 K/uL   Lymphocytes Relative 4 %   Lymphs Abs 0.6 (L) 0.7 - 4.0 K/uL   Monocytes Relative 5 %   Monocytes Absolute 0.7 0.1 - 1.0 K/uL   Eosinophils Relative 0 %   Eosinophils Absolute 0.0 0.0 - 0.5 K/uL   Basophils Relative 0 %   Basophils Absolute 0.0 0.0 - 0.1 K/uL   Immature Granulocytes 0 %   Abs Immature Granulocytes 0.06 0.00 - 0.07 K/uL  Lipase, blood     Status: Abnormal   Collection Time: 03/27/22  9:58 PM  Result Value Ref Range   Lipase <10 (L) 11 - 51 U/L  Urinalysis, Routine w reflex microscopic     Status: Abnormal   Collection Time: 03/27/22 11:14 PM  Result Value Ref Range   Color, Urine YELLOW YELLOW   APPearance HAZY (A) CLEAR   Specific Gravity, Urine 1.019 1.005 - 1.030   pH 7.5 5.0 - 8.0   Glucose, UA NEGATIVE  NEGATIVE mg/dL   Hgb urine dipstick LARGE (A) NEGATIVE   Bilirubin Urine NEGATIVE NEGATIVE   Ketones, ur >80 (A) NEGATIVE mg/dL   Protein, ur 30 (A)  NEGATIVE mg/dL   Nitrite POSITIVE (A) NEGATIVE   Leukocytes,Ua LARGE (A) NEGATIVE   RBC / HPF >50 (H) 0 - 5 RBC/hpf   WBC, UA >50 (H) 0 - 5 WBC/hpf   Bacteria, UA FEW (A) NONE SEEN   Squamous Epithelial / LPF 0-5 0 - 5   WBC Clumps PRESENT    Mucus PRESENT   Pregnancy, urine     Status: None   Collection Time: 03/27/22 11:14 PM  Result Value Ref Range   Preg Test, Ur NEGATIVE NEGATIVE   No results found for this or any previous visit (from the past 240 hour(s)).  Renal Function: Recent Labs    03/27/22 2158  CREATININE 0.95   Estimated Creatinine Clearance: 82.4 mL/min (by C-G formula based on SCr of 0.95 mg/dL).  Radiologic Imaging: CT Renal Stone Study  Result Date: 03/27/2022 CLINICAL DATA:  Kidney stone and seizure in car EXAM: CT ABDOMEN AND PELVIS WITHOUT CONTRAST TECHNIQUE: Multidetector CT imaging of the abdomen and pelvis was performed following the standard protocol without IV contrast. RADIATION DOSE REDUCTION: This exam was performed according to the departmental dose-optimization program which includes automated exposure control, adjustment of the mA and/or kV according to patient size and/or use of iterative reconstruction technique. COMPARISON:  CT 02/02/2022 FINDINGS: Lower chest: No acute abnormality. Hepatobiliary: Sludge in the gallbladder. No biliary dilation. No focal hepatic lesion. Pancreas: Unremarkable. Spleen: Unremarkable. Adrenals/Urinary Tract: Tiny non-obstructing right renal calculi. No right hydronephrosis. Edematous left kidney with moderate left hydroureteronephrosis. Small amount of perirenal fluid. Obstructing stones in the mid left ureter measuring 4 and 3 mm respectively. Decompressed bladder. Unremarkable adrenal glands. Stomach/Bowel: Unremarkable stomach. Normal caliber large and small bowel.  Appendectomy. Vascular/Lymphatic: No significant vascular findings are present. No enlarged abdominal or pelvic lymph nodes. Reproductive: Unremarkable. Other: No free intraperitoneal air. Musculoskeletal: No acute or significant osseous findings. IMPRESSION: Two obstructing 4 and 3 mm stones in the mid left ureter with associated left hydroureteronephrosis. Nonobstructing right nephrolithiasis. Electronically Signed   By: Placido Sou M.D.   On: 03/27/2022 23:40    I independently reviewed the above imaging studies.  Impression/Recommendation: Left ureterolithiasis  - C/f UTI on UA - WBC 14.0 - IV CTX, follow up cultures and cater accordingly - PRN analgesia - Flomax 0.'4mg'$  qHS  - IVF - Trend WBC, Cr - Plan for cystoscopy, left retrograde pyelogram, left ureteral stent placement  Lamar Laundry 03/28/2022, 3:38 AM   I have seen and examined the patient and agree with the above assessment and plan.  46m and 338mobstructing left ureteral stones with signs of infection. Afebrile, WBC of 14, nitrite positive. Will plan for left ureteral stent placement and will treat with culture specific antibiotics. Discussed will require definitive treatment of stone outpatient and will arrange.  -The risks, benefits and alternatives of cystoscopy with left JJ stent placement was discussed with the patient.  Risks include, but are not limited to: bleeding, urinary tract infection, ureteral injury, ureteral stricture disease, chronic pain, urinary symptoms, bladder injury, stent migration, the need for nephrostomy tube placement, MI, CVA, DVT, PE and the inherent risks with general anesthesia.  The patient voices understanding and wishes to proceed.   Matt R. [facility]  Pager: [phone]

## 2022-03-29 ENCOUNTER — Encounter (HOSPITAL_COMMUNITY): Payer: Self-pay | Admitting: Urology

## 2022-03-29 DIAGNOSIS — N136 Pyonephrosis: Secondary | ICD-10-CM | POA: Diagnosis not present

## 2022-03-29 LAB — CBC
HCT: 33.1 % — ABNORMAL LOW (ref 36.0–46.0)
Hemoglobin: 10.4 g/dL — ABNORMAL LOW (ref 12.0–15.0)
MCH: 31.4 pg (ref 26.0–34.0)
MCHC: 31.4 g/dL (ref 30.0–36.0)
MCV: 100 fL (ref 80.0–100.0)
Platelets: 218 10*3/uL (ref 150–400)
RBC: 3.31 MIL/uL — ABNORMAL LOW (ref 3.87–5.11)
RDW: 13.2 % (ref 11.5–15.5)
WBC: 13.3 10*3/uL — ABNORMAL HIGH (ref 4.0–10.5)
nRBC: 0 % (ref 0.0–0.2)

## 2022-03-29 LAB — BASIC METABOLIC PANEL
Anion gap: 6 (ref 5–15)
BUN: 11 mg/dL (ref 6–20)
CO2: 24 mmol/L (ref 22–32)
Calcium: 8.5 mg/dL — ABNORMAL LOW (ref 8.9–10.3)
Chloride: 105 mmol/L (ref 98–111)
Creatinine, Ser: 0.74 mg/dL (ref 0.44–1.00)
GFR, Estimated: >60 mL/min (ref >60)
Glucose, Bld: 109 mg/dL — ABNORMAL HIGH (ref 70–99)
Potassium: 3.3 mmol/L — ABNORMAL LOW (ref 3.5–5.1)
Sodium: 135 mmol/L (ref 135–145)

## 2022-03-29 LAB — MAGNESIUM: Magnesium: 1.9 mg/dL (ref 1.7–2.4)

## 2022-03-29 MED ORDER — SENNOSIDES-DOCUSATE SODIUM 8.6-50 MG PO TABS
1.0000 | ORAL_TABLET | Freq: Two times a day (BID) | ORAL | Status: DC
  Administered 2022-03-29 – 2022-03-30 (×3): 1 via ORAL
  Filled 2022-03-29 (×3): qty 1

## 2022-03-29 MED ORDER — POTASSIUM CHLORIDE CRYS ER 20 MEQ PO TBCR
30.0000 meq | EXTENDED_RELEASE_TABLET | ORAL | Status: AC
  Administered 2022-03-29 (×2): 30 meq via ORAL
  Filled 2022-03-29 (×2): qty 1

## 2022-03-29 MED ORDER — POLYETHYLENE GLYCOL 3350 17 G PO PACK
17.0000 g | PACK | Freq: Every day | ORAL | Status: DC | PRN
  Administered 2022-03-29: 17 g via ORAL
  Filled 2022-03-29: qty 1

## 2022-03-29 MED ORDER — HYDROCODONE-ACETAMINOPHEN 7.5-325 MG PO TABS
1.0000 | ORAL_TABLET | Freq: Four times a day (QID) | ORAL | Status: DC | PRN
  Administered 2022-03-29 – 2022-03-30 (×5): 1 via ORAL
  Filled 2022-03-29 (×5): qty 1

## 2022-03-29 NOTE — Progress Notes (Signed)
Patient voided 300 mL clear pink urine.  Angie Fava, RN

## 2022-03-29 NOTE — Progress Notes (Signed)
Patient voided an unmeasured amount of clear yellow urine in toilet.  Angie Fava, RN

## 2022-03-29 NOTE — Progress Notes (Signed)
Urology Inpatient Progress Report  Pyohydronephrosis [N13.6]  Procedure(s): CYSTOSCOPY WITH RETROGRADE PYELOGRAM/URETERAL STENT PLACEMENT  1 Day Post-Op   Intv/Subj: No acute events overnight. Patient is without complaint.  Leukocytosis improving.  Creatinine normal.  Having some left-sided discomfort consistent with renal colic from the stent.  Has had good urine output.  Still has Foley.  Principal Problem:   Pyohydronephrosis Active Problems:   Seizure-like activity (HCC)  Current Facility-Administered Medications  Medication Dose Route Frequency Provider Last Rate Last Admin   acetaminophen (TYLENOL) tablet 650 mg  650 mg Oral Q6H PRN Etta Quill, DO       Or   acetaminophen (TYLENOL) suppository 650 mg  650 mg Rectal Q6H PRN Etta Quill, DO       cefTRIAXone (ROCEPHIN) 1 g in sodium chloride 0.9 % 100 mL IVPB  1 g Intravenous Q24H Jennette Kettle M, DO 200 mL/hr at 03/28/22 2213 1 g at 03/28/22 2213   enoxaparin (LOVENOX) injection 40 mg  40 mg Subcutaneous Q24H Jennette Kettle M, DO   40 mg at 03/28/22 1708   HYDROcodone-acetaminophen (NORCO) 7.5-325 MG per tablet 1 tablet  1 tablet Oral Q6H PRN [place] Eric J, DO   1 tablet at 03/29/22 3299   lactated ringers infusion   Intravenous Continuous [place] Donnamarie Poag, DO   Paused at 03/28/22 2214   metoprolol succinate (TOPROL-XL) 24 hr tablet 25 mg  25 mg Oral Daily [place] Donnamarie Poag, DO   25 mg at 03/29/22 1027   morphine (PF) 2 MG/ML injection 1 mg  1 mg Intravenous Q2H PRN [place] Donnamarie Poag, DO   1 mg at 03/28/22 1246   ondansetron (ZOFRAN) tablet 4 mg  4 mg Oral Q6H PRN Etta Quill, DO       Or   ondansetron [facility]) injection 4 mg  4 mg Intravenous Q6H PRN Etta Quill, DO       oxybutynin (DITROPAN) tablet 5 mg  5 mg Oral TID [place] Eric J, DO   5 mg at 03/29/22 1027   polyethylene glycol (MIRALAX / GLYCOLAX) packet 17 g  17 g Oral Daily PRN [place] Eric J, DO   17 g at 03/29/22 1028   potassium chloride  (KLOR-CON M) CR tablet 30 mEq  30 mEq Oral Q3H [place] Eric J, DO   30 mEq at 03/29/22 1308   senna-docusate (Senokot-S) tablet 1 tablet  1 tablet Oral BID [place] Eric J, DO   1 tablet at 03/29/22 1027   tiZANidine (ZANAFLEX) tablet 2 mg  2 mg Oral Q6H PRN [place] Donnamarie Poag, DO       topiramate (TOPAMAX) tablet 25 mg  25 mg Oral Daily Jennette Kettle M, DO   25 mg at 03/29/22 1027     Objective: Vital: Vitals:   03/28/22 2101 03/29/22 0019 03/29/22 0414 03/29/22 1027  BP: 135/83 (!) 144/71 119/77 132/79  Pulse: 60 (!) 59 (!) 54 (!) 53  Resp: '16 18 18   '$ Temp: 98 F (36.7 C) 98.4 F (36.9 C) 98.3 F (36.8 C)   TempSrc: Oral Oral Oral   SpO2: 100% 100% 100%   Weight:      Height:       I/Os: I/O last 3 completed shifts: In: 3798.6 [P.O.:240; I.V.:1958.6; Other:400; IV Piggyback:1200] Out: 2426 [Urine:1650]  Physical Exam:  General: Patient is in no apparent distress Lungs: Normal respiratory effort, chest expands symmetrically. GI: The abdomen is soft and nontender without mass. Foley: Draining clear yellow  urine Ext: lower extremities symmetric  Lab Results: Recent Labs    03/27/22 2158 03/28/22 0510 03/29/22 0917  WBC 14.0* 17.6* 13.3*  HGB 12.7 13.3 10.4*  HCT 38.7 42.5 33.1*   Recent Labs    03/27/22 2158 03/28/22 0510 03/29/22 0917  NA 139 139 135  K 3.5 3.5 3.3*  CL 105 108 105  CO2 23 20* 24  GLUCOSE 113* 121* 109*  BUN '14 15 11  '$ CREATININE 0.95 0.94 0.74  CALCIUM 9.4 8.8* 8.5*   No results for input(s): "LABPT", "INR" in the last 72 hours. No results for input(s): "LABURIN" in the last 72 hours. Results for orders placed or performed during the hospital encounter of 03/27/22  Urine Culture     Status: Abnormal (Preliminary result)   Collection Time: 03/27/22 11:14 PM   Specimen: Urine, Clean Catch  Result Value Ref Range Status   Specimen Description   Final    URINE, CLEAN CATCH Performed at [facility], [address] [place], [place] [address]    Special Requests   Final    NONE Performed at [facility], [address] [place], [place] [address]    Culture >=100,000 COLONIES/mL ESCHERICHIA COLI (A)  Final   Report Status PENDING  Incomplete  Urine Culture     Status: Abnormal (Preliminary result)   Collection Time: 03/28/22  7:33 AM   Specimen: Kidney  Result Value Ref Range Status   Specimen Description   Final    KIDNEY LT RENAL PELVIC CYTOSCOPE URINE Performed at [facility] [address], [place], [place] [address]    Special Requests   Final    NONE Performed at [facility], [place] [address], [place], [place] [address]    Culture 10,000 COLONIES/mL ESCHERICHIA COLI (A)  Final   Report Status PENDING  Incomplete    Studies/Results: DG C-Arm 1-60 Min-No Report  Result Date: 03/28/2022 Fluoroscopy was utilized by the requesting physician.  No radiographic interpretation.   CT Renal Stone Study  Result Date: 03/27/2022 CLINICAL DATA:  Kidney stone and seizure in car EXAM: CT ABDOMEN AND PELVIS WITHOUT CONTRAST TECHNIQUE: Multidetector CT imaging of the abdomen and pelvis was performed following the standard protocol without IV contrast. RADIATION DOSE REDUCTION: This exam was performed according to the departmental dose-optimization program which includes automated exposure control, adjustment of the mA and/or kV according to patient size and/or use of iterative reconstruction technique. COMPARISON:  CT 02/02/2022 FINDINGS: Lower chest: No acute abnormality. Hepatobiliary: Sludge in the gallbladder. No biliary dilation. No focal hepatic lesion. Pancreas: Unremarkable. Spleen: Unremarkable. Adrenals/Urinary Tract: Tiny non-obstructing right renal calculi. No right hydronephrosis. Edematous left kidney with moderate left hydroureteronephrosis. Small amount of perirenal fluid. Obstructing stones in the mid left ureter measuring 4 and 3  mm respectively. Decompressed bladder. Unremarkable adrenal glands. Stomach/Bowel: Unremarkable stomach. Normal caliber large and small bowel. Appendectomy. Vascular/Lymphatic: No significant vascular findings are present. No enlarged abdominal or pelvic lymph nodes. Reproductive: Unremarkable. Other: No free intraperitoneal air. Musculoskeletal: No acute or significant osseous findings. IMPRESSION: Two obstructing 4 and 3 mm stones in the mid left ureter with associated left hydroureteronephrosis. Nonobstructing right nephrolithiasis. Electronically Signed   By: Placido Sou M.D.   On: 03/27/2022 23:40    Assessment: Left ureteral calculus Left ureteral obstruction secondary to calculus Urinary tract infection  Procedure(s): CYSTOSCOPY WITH RETROGRADE PYELOGRAM/URETERAL STENT PLACEMENT, 1 Day Post-Op  doing well.  Plan: Continue ceftriaxone.  Urine culture with  E. coli, sensitivities pending.  Once those come back, she can likely be discharged on p.o. antibiotic.  Complete a total 14-day course of antibiotics.  Foley catheter removal today.   Link Snuffer, MD Urology 03/29/2022, 1:09 PM

## 2022-03-29 NOTE — Progress Notes (Addendum)
PROGRESS NOTE    Marie Brown  ZYS:[id number] DOB: 19-May-1991 DOA: 03/27/2022 PCP: Marijo File, MD    Brief Narrative:   Marie Brown is a 31 y.o. female with past medical history significant for kidney stones, migraine headaches who presented to [place] ED on 11/9 with nausea/vomiting, left-sided flank pain.  Patient reports similar pain in the past from a kidney stone has also noticed decreased urine output.  Denies fever or back pain but pain is severe in nature.    In route to the ED, patient reported possible "seizure" which has been a recurrent issue for her.  She reported some facial numbness and shaking in the back of the car.  This has been going on and off for several months, reportedly this is her sixth "seizure" and is due to see neurology later this month.  In the ED, temperature 97.8 F, HR 101, RR 22, BP 146/129, SPO2 100% on room air.  WBC 14.0, hemoglobin 12.7, platelets 326.  Sodium 139, potassium 3.5, chloride 105, CO2 23, glucose 113, BUN 14, creatinine 0.95.  AST 16, ALT 13, total bilirubin 1.1.  Lipase less than 10.  Urine pregnancy test negative.  Urinalysis with large leukocytes, positive nitrite, few bacteria, greater than 50 WBCs.  CT renal stone study with 2 obstructing 4 and 3 mm stones in the mid left ureter with associated left hydro ureter nephrosis, nonobstructing right nephrolithiasis.  Urine culture obtained.  Urology was consulted.  Hospital service consulted for further evaluation and management of pyohydronephrosis secondary to obstructive nephrolithiasis.  Assessment & Plan:   Pyohydronephrosis secondary to obstructing nephrolithiasis Patient presenting to ED with nausea/vomiting and left-sided flank pain.  Urinalysis with large leukocytes, positive nitrite, few bacteria and greater than 50 WBCs.  CT renal stone study with 2 obstructing stones measuring 4 and 3 mm in the mid left ureter with associated left hydroureteronephrosis.   Urology was consulted and patient underwent cystoscopy, left retrograde pyelogram and left ureteral stent placement by Dr. Abner Greenspan on 03/28/2022. -- Urology following, appreciate assistance -- Urine culture: >100K E. coli, susceptibilities -- Continue Foley catheter until afebrile x24 hours per urology -- Ceftriaxone 1 g IV every 24 hours -- Norco 7.5-325 mg p.o. every 6 hours as needed moderate pain -- Morphine 1 mg IV every 2 hours as needed severe pain -- Zofran as needed nausea/vomiting  Hypokalemia Potassium 3.3, will replete. -- Repeat electrolytes in a.m.  Hx migraine headaches Questionable seizure-like activity Patient reports shaking and facial numbness in route to the ED, has been a recurrent issue for several months.  Patient reports this is her sixth "seizure".  Has plan to see neurology later this month.  Suspect her symptoms were likely related to her infection with obstructing nephrolithiasis as above. -- Continue Topamax -- Seizure precautions -- If has recurrent seizure while inpatient, will consult neurology and obtain EEG; otherwise plan outpatient follow-up with neurology  Hx inappropriate sinus tachycardia Has been seen by cardiology outpatient [facility].  Plan for loop recorder placement December 2023. --Continue metoprolol succinate 25 mg p.o. daily -- Continue to monitor on telemetry   DVT prophylaxis: enoxaparin (LOVENOX) injection 40 mg Start: 03/28/22 1600    Code Status: Full Code Family Communication:   Disposition Plan:  Level of care: Progressive Status is: Inpatient Remains inpatient appropriate because: IV antibiotics, awaiting urine culture identification/susceptibilities for transition to oral antibiotics per urology, anticipate discharge home in 1-2 days    Consultants:  Allergy, Dr. Abner Greenspan  Procedures:  Cystoscopy with left ureteral stent placement, Dr. Abner Greenspan with 11/10  Antimicrobials:  Ceftriaxone 11/9   Subjective: Patient seen  examined bedside, resting comfortably.  Lying in bed.  Continues with mild headache, requesting change of pain medication.  Discussed will need culture sensitivities back before ready for discharge home.  Hopeful for discharge possibly tomorrow.  No other questions or concerns at this time.  Denies visual changes, no chest pain, no palpitations, no shortness of breath, no current fever/chills/night sweats, no vomiting/diarrhea, no cough/congestion, no fatigue, no focal weakness, no paresthesias.  No acute concerns overnight per nursing staff.  Objective: Vitals:   03/28/22 2101 03/29/22 0019 03/29/22 0414 03/29/22 1027  BP: 135/83 (!) 144/71 119/77 132/79  Pulse: 60 (!) 59 (!) 54 (!) 53  Resp: '16 18 18   '$ Temp: 98 F (36.7 C) 98.4 F (36.9 C) 98.3 F (36.8 C)   TempSrc: Oral Oral Oral   SpO2: 100% 100% 100%   Weight:      Height:        Intake/Output Summary (Last 24 hours) at 03/29/2022 1254 Last data filed at 03/29/2022 1100 Gross per 24 hour  Intake 2398.58 ml  Output 2400 ml  Net -1.42 ml   Filed Weights   03/28/22 0634  Weight: 74.8 kg    Examination:  Physical Exam: GEN: NAD, alert and oriented x 3, wd/wn HEENT: NCAT, PERRL, EOMI, sclera clear, MMM PULM: CTAB w/o wheezes/crackles, normal respiratory effort CV: RRR w/o M/G/R GI: abd soft, NTND, NABS, no R/G/M MSK: no peripheral edema, muscle strength globally intact 5/5 bilateral upper/lower extremities NEURO: CN II-XII intact, no focal deficits, sensation to light touch intact PSYCH: normal mood/affect Integumentary: dry/intact, no rashes or wounds    Data Reviewed: I have personally reviewed following labs and imaging studies  CBC: Recent Labs  Lab 03/27/22 2158 03/28/22 0510 03/29/22 0917  WBC 14.0* 17.6* 13.3*  NEUTROABS 12.7*  --   --   HGB 12.7 13.3 10.4*  HCT 38.7 42.5 33.1*  MCV 95.8 102.4* 100.0  PLT 326 278 160   Basic Metabolic Panel: Recent Labs  Lab 03/27/22 2158 03/28/22 0510  03/29/22 0917  NA 139 139 135  K 3.5 3.5 3.3*  CL 105 108 105  CO2 23 20* 24  GLUCOSE 113* 121* 109*  BUN '14 15 11  '$ CREATININE 0.95 0.94 0.74  CALCIUM 9.4 8.8* 8.5*  MG  --   --  1.9   GFR: Estimated Creatinine Clearance: 97.9 mL/min (by C-G formula based on SCr of 0.74 mg/dL). Liver Function Tests: Recent Labs  Lab 03/27/22 2158  AST 16  ALT 13  ALKPHOS 41  BILITOT 1.1  PROT 7.7  ALBUMIN 4.5   Recent Labs  Lab 03/27/22 2158  LIPASE <10*   No results for input(s): "AMMONIA" in the last 168 hours. Coagulation Profile: No results for input(s): "INR", "PROTIME" in the last 168 hours. Cardiac Enzymes: No results for input(s): "CKTOTAL", "CKMB", "CKMBINDEX", "TROPONINI" in the last 168 hours. BNP (last 3 results) No results for input(s): "PROBNP" in the last 8760 hours. HbA1C: No results for input(s): "HGBA1C" in the last 72 hours. CBG: No results for input(s): "GLUCAP" in the last 168 hours. Lipid Profile: No results for input(s): "CHOL", "HDL", "LDLCALC", "TRIG", "CHOLHDL", "LDLDIRECT" in the last 72 hours. Thyroid Function Tests: No results for input(s): "TSH", "T4TOTAL", "FREET4", "T3FREE", "THYROIDAB" in the last 72 hours. Anemia Panel: No results for input(s): "VITAMINB12", "FOLATE", "FERRITIN", "TIBC", "IRON", "RETICCTPCT" in the  last 72 hours. Sepsis Labs: No results for input(s): "PROCALCITON", "LATICACIDVEN" in the last 168 hours.  Recent Results (from the past 240 hour(s))  Urine Culture     Status: Abnormal (Preliminary result)   Collection Time: 03/27/22 11:14 PM   Specimen: Urine, Clean Catch  Result Value Ref Range Status   Specimen Description   Final    URINE, CLEAN CATCH Performed at [facility], [address] [place], [place] [address]    Special Requests   Final    NONE Performed at [facility], [address] [place], [place] [address]    Culture >=100,000 COLONIES/mL ESCHERICHIA COLI (A)   Final   Report Status PENDING  Incomplete  Urine Culture     Status: Abnormal (Preliminary result)   Collection Time: 03/28/22  7:33 AM   Specimen: Kidney  Result Value Ref Range Status   Specimen Description   Final    KIDNEY LT RENAL PELVIC CYTOSCOPE URINE Performed at [facility], [place] [address] [place], [place] [address]    Special Requests   Final    NONE Performed at [facility], [place] [address], [place], [place] [address]    Culture 10,000 COLONIES/mL ESCHERICHIA COLI (A)  Final   Report Status PENDING  Incomplete         Radiology Studies: DG C-Arm 1-60 Min-No Report  Result Date: 03/28/2022 Fluoroscopy was utilized by the requesting physician.  No radiographic interpretation.   CT Renal Stone Study  Result Date: 03/27/2022 CLINICAL DATA:  Kidney stone and seizure in car EXAM: CT ABDOMEN AND PELVIS WITHOUT CONTRAST TECHNIQUE: Multidetector CT imaging of the abdomen and pelvis was performed following the standard protocol without IV contrast. RADIATION DOSE REDUCTION: This exam was performed according to the departmental dose-optimization program which includes automated exposure control, adjustment of the mA and/or kV according to patient size and/or use of iterative reconstruction technique. COMPARISON:  CT 02/02/2022 FINDINGS: Lower chest: No acute abnormality. Hepatobiliary: Sludge in the gallbladder. No biliary dilation. No focal hepatic lesion. Pancreas: Unremarkable. Spleen: Unremarkable. Adrenals/Urinary Tract: Tiny non-obstructing right renal calculi. No right hydronephrosis. Edematous left kidney with moderate left hydroureteronephrosis. Small amount of perirenal fluid. Obstructing stones in the mid left ureter measuring 4 and 3 mm respectively. Decompressed bladder. Unremarkable adrenal glands. Stomach/Bowel: Unremarkable stomach. Normal caliber large and small bowel. Appendectomy. Vascular/Lymphatic: No significant vascular  findings are present. No enlarged abdominal or pelvic lymph nodes. Reproductive: Unremarkable. Other: No free intraperitoneal air. Musculoskeletal: No acute or significant osseous findings. IMPRESSION: Two obstructing 4 and 3 mm stones in the mid left ureter with associated left hydroureteronephrosis. Nonobstructing right nephrolithiasis. Electronically Signed   By: Placido Sou M.D.   On: 03/27/2022 23:40        Scheduled Meds:  enoxaparin (LOVENOX) injection  40 mg Subcutaneous Q24H   metoprolol succinate  25 mg Oral Daily   oxybutynin  5 mg Oral TID   potassium chloride  30 mEq Oral Q3H   senna-docusate  1 tablet Oral BID   topiramate  25 mg Oral Daily   Continuous Infusions:  cefTRIAXone (ROCEPHIN)  IV 1 g (03/28/22 2213)   lactated ringers Stopped (03/28/22 2214)     LOS: 1 day    Time spent: 52 minutes spent on chart review, discussion with nursing staff, consultants, updating family and interview/physical exam; more than 50% of that time was spent in counseling and/or coordination of care.    Kleo Dungee J [place] DO [facility] Available via Epic secure chat 7am-7pm After these hours, please refer to coverage provider listed on [email or web address] 03/29/2022, 12:54 PM

## 2022-03-29 NOTE — Progress Notes (Signed)
Foley catheter removed per MD order.  Due to void by 2120.  Patient instructed to call for assistance prior to getting out of bed to use bathroom.  Angie Fava, RN

## 2022-03-30 DIAGNOSIS — N136 Pyonephrosis: Secondary | ICD-10-CM | POA: Diagnosis not present

## 2022-03-30 LAB — CBC
HCT: 30.8 % — ABNORMAL LOW (ref 36.0–46.0)
Hemoglobin: 9.9 g/dL — ABNORMAL LOW (ref 12.0–15.0)
MCH: 31.8 pg (ref 26.0–34.0)
MCHC: 32.1 g/dL (ref 30.0–36.0)
MCV: 99 fL (ref 80.0–100.0)
Platelets: 204 10*3/uL (ref 150–400)
RBC: 3.11 MIL/uL — ABNORMAL LOW (ref 3.87–5.11)
RDW: 13.1 % (ref 11.5–15.5)
WBC: 7 10*3/uL (ref 4.0–10.5)
nRBC: 0 % (ref 0.0–0.2)

## 2022-03-30 LAB — BASIC METABOLIC PANEL
Anion gap: 8 (ref 5–15)
BUN: 9 mg/dL (ref 6–20)
CO2: 23 mmol/L (ref 22–32)
Calcium: 8.4 mg/dL — ABNORMAL LOW (ref 8.9–10.3)
Chloride: 107 mmol/L (ref 98–111)
Creatinine, Ser: 0.7 mg/dL (ref 0.44–1.00)
GFR, Estimated: >60 mL/min (ref >60)
Glucose, Bld: 96 mg/dL (ref 70–99)
Potassium: 3.5 mmol/L (ref 3.5–5.1)
Sodium: 138 mmol/L (ref 135–145)

## 2022-03-30 LAB — MAGNESIUM: Magnesium: 1.5 mg/dL — ABNORMAL LOW (ref 1.7–2.4)

## 2022-03-30 LAB — URINE CULTURE
Culture: >=100000 — AB
Culture: 10000 — AB

## 2022-03-30 MED ORDER — MAGNESIUM SULFATE 2 GM/50ML IV SOLN
2.0000 g | Freq: Once | INTRAVENOUS | Status: AC
  Administered 2022-03-30: 2 g via INTRAVENOUS
  Filled 2022-03-30: qty 50

## 2022-03-30 MED ORDER — HYDROCODONE-ACETAMINOPHEN 5-325 MG PO TABS: 1.0000 | ORAL_TABLET | Freq: Four times a day (QID) | ORAL | 0 refills | Status: AC | PRN

## 2022-03-30 MED ORDER — CEPHALEXIN 500 MG PO CAPS: 500.0000 mg | ORAL_CAPSULE | Freq: Four times a day (QID) | ORAL | 0 refills | Status: AC

## 2022-03-30 MED ORDER — POTASSIUM CHLORIDE CRYS ER 20 MEQ PO TBCR
40.0000 meq | EXTENDED_RELEASE_TABLET | Freq: Once | ORAL | Status: AC
  Administered 2022-03-30: 40 meq via ORAL
  Filled 2022-03-30: qty 2

## 2022-03-30 NOTE — Plan of Care (Signed)
  Problem: Education: Goal: Knowledge of General Education information will improve Description Including pain rating scale, medication(s)/side effects and non-pharmacologic comfort measures Outcome: Progressing   Problem: Health Behavior/Discharge Planning: Goal: Ability to manage health-related needs will improve Outcome: Progressing   

## 2022-03-30 NOTE — Plan of Care (Signed)
  Problem: Education: Goal: Knowledge of General Education information will improve Description: Including pain rating scale, medication(s)/side effects and non-pharmacologic comfort measures 03/30/2022 1415 by Emmaline Life, RN Outcome: Adequate for Discharge 03/30/2022 1101 by Emmaline Life, RN Outcome: Progressing   Problem: Health Behavior/Discharge Planning: Goal: Ability to manage health-related needs will improve 03/30/2022 1415 by Emmaline Life, RN Outcome: Adequate for Discharge 03/30/2022 1101 by Emmaline Life, RN Outcome: Progressing   Problem: Clinical Measurements: Goal: Ability to maintain clinical measurements within normal limits will improve Outcome: Adequate for Discharge Goal: Will remain free from infection Outcome: Adequate for Discharge Goal: Diagnostic test results will improve Outcome: Adequate for Discharge Goal: Respiratory complications will improve Outcome: Adequate for Discharge Goal: Cardiovascular complication will be avoided Outcome: Adequate for Discharge   Problem: Activity: Goal: Risk for activity intolerance will decrease Outcome: Adequate for Discharge   Problem: Nutrition: Goal: Adequate nutrition will be maintained Outcome: Adequate for Discharge   Problem: Coping: Goal: Level of anxiety will decrease Outcome: Adequate for Discharge   Problem: Elimination: Goal: Will not experience complications related to bowel motility Outcome: Adequate for Discharge Goal: Will not experience complications related to urinary retention Outcome: Adequate for Discharge   Problem: Pain Managment: Goal: General experience of comfort will improve Outcome: Adequate for Discharge   Problem: Safety: Goal: Ability to remain free from injury will improve Outcome: Adequate for Discharge   Problem: Skin Integrity: Goal: Risk for impaired skin integrity will decrease Outcome: Adequate for Discharge

## 2022-03-30 NOTE — Progress Notes (Signed)
Urology Inpatient Progress Report  Pyohydronephrosis [N13.6]  Procedure(s): CYSTOSCOPY WITH RETROGRADE PYELOGRAM/URETERAL STENT PLACEMENT  2 Days Post-Op   Intv/Subj: No acute events overnight. Patient is without complaint except for some mild stent related discomfort to be expected.  Voiding well.  Leukocytosis resolved.  Culture sensitivity still pending.  Principal Problem:   Pyohydronephrosis Active Problems:   Seizure-like activity (HCC)  Current Facility-Administered Medications  Medication Dose Route Frequency Provider Last Rate Last Admin   acetaminophen (TYLENOL) tablet 650 mg  650 mg Oral Q6H PRN Etta Quill, DO       Or   acetaminophen (TYLENOL) suppository 650 mg  650 mg Rectal Q6H PRN Etta Quill, DO       cefTRIAXone (ROCEPHIN) 1 g in sodium chloride 0.9 % 100 mL IVPB  1 g Intravenous Q24H Jennette Kettle M, DO 200 mL/hr at 03/29/22 2232 1 g at 03/29/22 2232   enoxaparin (LOVENOX) injection 40 mg  40 mg Subcutaneous Q24H Jennette Kettle M, DO   40 mg at 03/29/22 1632   HYDROcodone-acetaminophen (NORCO) 7.5-325 MG per tablet 1 tablet  1 tablet Oral Q6H PRN [place] Eric J, DO   1 tablet at 03/30/22 6010   lactated ringers infusion   Intravenous Continuous [place] Eric J, DO 75 mL/hr at 03/29/22 1315 New Bag at 03/29/22 1315   metoprolol succinate (TOPROL-XL) 24 hr tablet 25 mg  25 mg Oral Daily [place] Eric J, DO   25 mg at 03/30/22 9323   morphine (PF) 2 MG/ML injection 1 mg  1 mg Intravenous Q2H PRN [place] Donnamarie Poag, DO   1 mg at 03/28/22 1246   ondansetron (ZOFRAN) tablet 4 mg  4 mg Oral Q6H PRN Etta Quill, DO       Or   ondansetron [facility]) injection 4 mg  4 mg Intravenous Q6H PRN Etta Quill, DO   4 mg at 03/30/22 0906   oxybutynin (DITROPAN) tablet 5 mg  5 mg Oral TID [place] Eric J, DO   5 mg at 03/30/22 0906   polyethylene glycol (MIRALAX / GLYCOLAX) packet 17 g  17 g Oral Daily PRN [place] Eric J, DO   17 g at 03/29/22 1028    senna-docusate (Senokot-S) tablet 1 tablet  1 tablet Oral BID [place] Eric J, DO   1 tablet at 03/30/22 5573   tiZANidine (ZANAFLEX) tablet 2 mg  2 mg Oral Q6H PRN [place] Donnamarie Poag, DO       topiramate (TOPAMAX) tablet 25 mg  25 mg Oral Daily Jennette Kettle M, DO   25 mg at 03/30/22 2202     Objective: Vital: Vitals:   03/29/22 1027 03/29/22 1425 03/29/22 2016 03/30/22 0630  BP: 132/79 (!) 126/91 111/60 (!) 115/59  Pulse: (!) 53 64    Resp:  '16 11 17  '$ Temp:  97.8 F (36.6 C) 98.1 F (36.7 C) 98.2 F (36.8 C)  TempSrc:  Oral Oral Oral  SpO2:  100% 100% 100%  Weight:      Height:       I/Os: I/O last 3 completed shifts: In: 3913.5 [P.O.:240; I.V.:3073.5; Other:400; IV [id number]] Out: 3151 [VOHYW:[id number]]  Physical Exam:  General: Patient is in no apparent distress  Lab Results: Recent Labs    03/28/22 0510 03/29/22 0917 03/30/22 0425  WBC 17.6* 13.3* 7.0  HGB 13.3 10.4* 9.9*  HCT 42.5 33.1* 30.8*   Recent Labs    03/28/22 0510 03/29/22 0917 03/30/22 0425  NA 139 135 138  K 3.5 3.3* 3.5  CL 108 105 107  CO2 20* 24 23  GLUCOSE 121* 109* 96  BUN '15 11 9  '$ CREATININE 0.94 0.74 0.70  CALCIUM 8.8* 8.5* 8.4*   No results for input(s): "LABPT", "INR" in the last 72 hours. No results for input(s): "LABURIN" in the last 72 hours. Results for orders placed or performed during the hospital encounter of 03/27/22  Urine Culture     Status: Abnormal (Preliminary result)   Collection Time: 03/27/22 11:14 PM   Specimen: Urine, Clean Catch  Result Value Ref Range Status   Specimen Description   Final    URINE, CLEAN CATCH Performed at [facility], [address], [place], [place] [address]    Special Requests   Final    NONE Performed at [facility], [address] [place], [place] [address]    Culture >=100,000 COLONIES/mL ESCHERICHIA COLI (A)  Final   Report Status PENDING  Incomplete  Urine Culture     Status: Abnormal  (Preliminary result)   Collection Time: 03/28/22  7:33 AM   Specimen: Kidney  Result Value Ref Range Status   Specimen Description   Final    KIDNEY LT RENAL PELVIC CYTOSCOPE URINE Performed at [facility] [address] [place], [place] [address]    Special Requests   Final    NONE Performed at [facility] [place] [address], [place], [place] [address]    Culture 10,000 COLONIES/mL ESCHERICHIA COLI (A)  Final   Report Status PENDING  Incomplete    Studies/Results: No results found.  Assessment: Left ureteral calculus Left ureteral obstruction secondary to calculus UTI  Procedure(s): CYSTOSCOPY WITH RETROGRADE PYELOGRAM/URETERAL STENT PLACEMENT, 2 Days Post-Op  doing well.  Plan: Transition to p.o. antibiotics once sensitivities return and she can go home today if that occurs.  Follow-up outpatient for scheduling of ureteroscopy.   Link Snuffer, MD Urology 03/30/2022, 10:15 AM

## 2022-03-30 NOTE — TOC Progression Note (Signed)
Transition of Care [facility]) - Progression Note    Patient Details  Name: Marie Brown MRN: [id number] Date of Birth: 06-28-1990  Transition of Care [facility]) CM/SW Contact  Henrietta Dine, RN Phone Number: 03/30/2022, 12:55 PM  Clinical Narrative:      Transition of Care [facility]) Screening Note   Patient Details  Name: Marie Brown Date of Birth: August 09, 1990   Transition of Care [facility]) CM/SW Contact:    Henrietta Dine, RN Phone Number: 03/30/2022, 12:55 PM    Transition of Care Department [facility]) has reviewed patient and no TOC needs have been identified at this time. We will continue to monitor patient advancement through interdisciplinary progression rounds. If new patient transition needs arise, please place a TOC consult.         Expected Discharge Plan and Services           Expected Discharge Date: 03/30/22                                     Social Determinants of Health (SDOH) Interventions    Readmission Risk Interventions     No data to display

## 2022-03-30 NOTE — Progress Notes (Signed)
Discharge instructions, RX's and follow up appts explained and provided to patient verbalized understanding. Patient left floor via wheel chair accompanied by staff.   Usha Slager, Tivis Ringer, RN

## 2022-03-30 NOTE — Discharge Summary (Signed)
Physician Discharge Summary  Marie Brown HYW:[id number] DOB: Oct 11, 1990 DOA: 03/27/2022  PCP: Marijo File, MD  Admit date: 03/27/2022 Discharge date: 03/30/2022  Admitted From: Home Disposition: Home  Recommendations for Outpatient Follow-up:  Follow up with PCP in 1-2 weeks Follow-up with urology, Dr. Abner Greenspan for repeat ureteroscopy Continue Keflex on discharge complete total course of 14 days for E. coli UTI/pyohydronephrosis  Home Health: No Equipment/Devices: None  Discharge Condition: Stable CODE STATUS: Full code Diet recommendation: Regular diet  History of present illness:  Marie Brown is a 31 y.o. female with past medical history significant for kidney stones, migraine headaches who presented to [place] ED on 11/9 with nausea/vomiting, left-sided flank pain.  Patient reports similar pain in the past from a kidney stone has also noticed decreased urine output.  Denies fever or back pain but pain is severe in nature.     In route to the ED, patient reported possible "seizure" which has been a recurrent issue for her.  She reported some facial numbness and shaking in the back of the car.  This has been going on and off for several months, reportedly this is her sixth "seizure" and is due to see neurology later this month.   In the ED, temperature 97.8 F, HR 101, RR 22, BP 146/129, SPO2 100% on room air.  WBC 14.0, hemoglobin 12.7, platelets 326.  Sodium 139, potassium 3.5, chloride 105, CO2 23, glucose 113, BUN 14, creatinine 0.95.  AST 16, ALT 13, total bilirubin 1.1.  Lipase less than 10.  Urine pregnancy test negative.  Urinalysis with large leukocytes, positive nitrite, few bacteria, greater than 50 WBCs.  CT renal stone study with 2 obstructing 4 and 3 mm stones in the mid left ureter with associated left hydro ureter nephrosis, nonobstructing right nephrolithiasis.  Urine culture obtained.  Urology was consulted.  Hospital service consulted for further  evaluation and management of pyohydronephrosis secondary to obstructive nephrolithiasis.  Hospital course:  Pyohydronephrosis secondary to obstructing nephrolithiasis Patient presenting to ED with nausea/vomiting and left-sided flank pain.  Urinalysis with large leukocytes, positive nitrite, few bacteria and greater than 50 WBCs.  CT renal stone study with 2 obstructing stones measuring 4 and 3 mm in the mid left ureter with associated left hydroureteronephrosis.  Urology was consulted and patient underwent cystoscopy, left retrograde pyelogram and left ureteral stent placement by Dr. Abner Greenspan on 03/28/2022.  Urine cultures positive for E. coli that were pan susceptible.  Patient was started on ceftriaxone while inpatient and will continue Keflex on discharge complete 14-day total course per urology.  Will need close outpatient follow-up with urology.   Hypokalemia Repleted during hospitalization  Hypomagnesemia Repleted during hospitalization   Hx migraine headaches Questionable seizure-like activity Patient reports shaking and facial numbness in route to the ED, has been a recurrent issue for several months.  Patient reports this is her sixth "seizure".  Has plan to see neurology later this month.  Suspect her symptoms were likely related to her infection with obstructing nephrolithiasis as above.  Continue Topamax.  No further seizure type episodes noted during hospitalization.  Outpatient follow-up with neurology.   Hx inappropriate sinus tachycardia Has been seen by cardiology outpatient [facility].  Plan for loop recorder placement December 2023. Continue metoprolol succinate 25 mg p.o. daily   Discharge Diagnoses:  Principal Problem:   Pyohydronephrosis Active Problems:   Seizure-like activity [facility])    Discharge Instructions  Discharge Instructions     Call MD for:  difficulty breathing, headache or visual disturbances   Complete by: As directed    Call MD for:  extreme  fatigue   Complete by: As directed    Call MD for:  persistant dizziness or light-headedness   Complete by: As directed    Call MD for:  persistant nausea and vomiting   Complete by: As directed    Call MD for:  severe uncontrolled pain   Complete by: As directed    Call MD for:  temperature >100.4   Complete by: As directed    Diet - low sodium heart healthy   Complete by: As directed    Increase activity slowly   Complete by: As directed       Allergies as of 03/30/2022   No Known Allergies      Medication List     STOP taking these medications    ibuprofen 800 MG tablet Commonly known as: ADVIL   predniSONE 10 MG (21) Tbpk tablet Commonly known as: STERAPRED UNI-PAK 21 TAB   traMADol 50 MG tablet Commonly known as: ULTRAM       TAKE these medications    cephALEXin 500 MG capsule Commonly known as: KEFLEX Take 1 capsule (500 mg total) by mouth 4 (four) times daily for 11 days.   HYDROcodone-acetaminophen 5-325 MG tablet Commonly known as: NORCO/VICODIN Take 1 tablet by mouth every 6 (six) hours as needed for up to 5 days for moderate pain.   metoprolol succinate 25 MG 24 hr tablet Commonly known as: TOPROL-XL Take 25 mg by mouth daily.   ondansetron 4 MG disintegrating tablet Commonly known as: ZOFRAN-ODT Take 1 tablet (4 mg total) by mouth every 8 (eight) hours as needed.   tiZANidine 2 MG tablet Commonly known as: ZANAFLEX Take 2 mg by mouth every 6 (six) hours as needed for muscle spasms.   topiramate 25 MG tablet Commonly known as: TOPAMAX Take 25 mg by mouth daily.   valACYclovir 1000 MG tablet Commonly known as: VALTREX Take 1,000 mg by mouth daily.        Follow-up Information     Janith Lima, MD. Schedule an appointment as soon as possible for a visit.   Specialty: Urology Contact information: [place] [address] [phone]         Marijo File, MD. Schedule an appointment as soon as possible for a  visit in 1 week(s).   Specialty: Family Medicine Contact information: [address] [place] [address] [phone]                No Known Allergies  Consultations: Urology   Procedures/Studies: DG C-Arm 1-60 Min-No Report  Result Date: 03/28/2022 Fluoroscopy was utilized by the requesting physician.  No radiographic interpretation.   CT Renal Stone Study  Result Date: 03/27/2022 CLINICAL DATA:  Kidney stone and seizure in car EXAM: CT ABDOMEN AND PELVIS WITHOUT CONTRAST TECHNIQUE: Multidetector CT imaging of the abdomen and pelvis was performed following the standard protocol without IV contrast. RADIATION DOSE REDUCTION: This exam was performed according to the departmental dose-optimization program which includes automated exposure control, adjustment of the mA and/or kV according to patient size and/or use of iterative reconstruction technique. COMPARISON:  CT 02/02/2022 FINDINGS: Lower chest: No acute abnormality. Hepatobiliary: Sludge in the gallbladder. No biliary dilation. No focal hepatic lesion. Pancreas: Unremarkable. Spleen: Unremarkable. Adrenals/Urinary Tract: Tiny non-obstructing right renal calculi. No right hydronephrosis. Edematous left kidney with moderate left hydroureteronephrosis. Small amount of perirenal  fluid. Obstructing stones in the mid left ureter measuring 4 and 3 mm respectively. Decompressed bladder. Unremarkable adrenal glands. Stomach/Bowel: Unremarkable stomach. Normal caliber large and small bowel. Appendectomy. Vascular/Lymphatic: No significant vascular findings are present. No enlarged abdominal or pelvic lymph nodes. Reproductive: Unremarkable. Other: No free intraperitoneal air. Musculoskeletal: No acute or significant osseous findings. IMPRESSION: Two obstructing 4 and 3 mm stones in the mid left ureter with associated left hydroureteronephrosis. Nonobstructing right nephrolithiasis. Electronically Signed   By: Placido Sou M.D.    On: 03/27/2022 23:40     Subjective: Patient seen examined bedside, resting comfortably.  Sleeping but easy arousable.  Urine culture susceptibilities now returned and stable for discharge home per urology.  Will need close outpatient follow-up for repeat ureteroscopy.  No other questions or concerns at this time.  Denies headache, no dizziness, no fever/chills/night sweats, no nausea/vomiting/diarrhea, no chest pain, no palpitations, no shortness of breath, no abdominal pain, no focal weakness, no fatigue, no paresthesias.  No acute events overnight per nursing staff.  Discharge Exam: Vitals:   03/30/22 0630 03/30/22 1027  BP: (!) 115/59 112/65  Pulse:  66  Resp: 17 18  Temp: 98.2 F (36.8 C) 98.3 F (36.8 C)  SpO2: 100% 97%   Vitals:   03/29/22 1425 03/29/22 2016 03/30/22 0630 03/30/22 1027  BP: (!) 126/91 111/60 (!) 115/59 112/65  Pulse: 64   66  Resp: '16 11 17 18  '$ Temp: 97.8 F (36.6 C) 98.1 F (36.7 C) 98.2 F (36.8 C) 98.3 F (36.8 C)  TempSrc: Oral Oral Oral Oral  SpO2: 100% 100% 100% 97%  Weight:      Height:        Physical Exam: GEN: NAD, alert and oriented x 3, wd/wn HEENT: NCAT, PERRL, EOMI, sclera clear, MMM PULM: CTAB w/o wheezes/crackles, normal respiratory effort, on room air CV: RRR w/o M/G/R GI: abd soft, NTND, NABS, no R/G/M MSK: no peripheral edema, muscle strength globally intact 5/5 bilateral upper/lower extremities NEURO: CN II-XII intact, no focal deficits, sensation to light touch intact PSYCH: normal mood/affect Integumentary: dry/intact, no rashes or wounds    The results of significant diagnostics from this hospitalization (including imaging, microbiology, ancillary and laboratory) are listed below for reference.     Microbiology: Recent Results (from the past 240 hour(s))  Urine Culture     Status: Abnormal   Collection Time: 03/27/22 11:14 PM   Specimen: Urine, Clean Catch  Result Value Ref Range Status   Specimen Description    Final    URINE, CLEAN CATCH Performed at [facility], [address] [place], [place] [address]    Special Requests   Final    NONE Performed at [facility], [address] [place], [place] [address]    Culture >=100,000 COLONIES/mL ESCHERICHIA COLI (A)  Final   Report Status 03/30/2022 FINAL  Final   Organism ID, Bacteria ESCHERICHIA COLI (A)  Final      Susceptibility   Escherichia coli - MIC*    AMPICILLIN <=2 SENSITIVE Sensitive     CEFAZOLIN <=4 SENSITIVE Sensitive     CEFEPIME <=0.12 SENSITIVE Sensitive     CEFTRIAXONE <=0.25 SENSITIVE Sensitive     CIPROFLOXACIN <=0.25 SENSITIVE Sensitive     GENTAMICIN <=1 SENSITIVE Sensitive     IMIPENEM <=0.25 SENSITIVE Sensitive     NITROFURANTOIN <=16 SENSITIVE Sensitive     TRIMETH/SULFA <=20 SENSITIVE Sensitive     AMPICILLIN/SULBACTAM <=2 SENSITIVE Sensitive     PIP/TAZO <=4  SENSITIVE Sensitive     * >=100,000 COLONIES/mL ESCHERICHIA COLI  Urine Culture     Status: Abnormal   Collection Time: 03/28/22  7:33 AM   Specimen: Kidney  Result Value Ref Range Status   Specimen Description   Final    KIDNEY LT RENAL PELVIC CYTOSCOPE URINE Performed at [facility] [address] [place], [place] [address]    Special Requests   Final    NONE Performed at [facility], [place] [address] [place], [place] [address]    Culture 10,000 COLONIES/mL ESCHERICHIA COLI (A)  Final   Report Status 03/30/2022 FINAL  Final   Organism ID, Bacteria ESCHERICHIA COLI (A)  Final      Susceptibility   Escherichia coli - MIC*    AMPICILLIN 8 SENSITIVE Sensitive     CEFAZOLIN <=4 SENSITIVE Sensitive     CEFEPIME <=0.12 SENSITIVE Sensitive     CEFTAZIDIME <=1 SENSITIVE Sensitive     CEFTRIAXONE <=0.25 SENSITIVE Sensitive     CIPROFLOXACIN <=0.25 SENSITIVE Sensitive     GENTAMICIN <=1 SENSITIVE Sensitive     IMIPENEM <=0.25 SENSITIVE Sensitive     TRIMETH/SULFA <=20  SENSITIVE Sensitive     AMPICILLIN/SULBACTAM <=2 SENSITIVE Sensitive     PIP/TAZO <=4 SENSITIVE Sensitive     * 10,000 COLONIES/mL ESCHERICHIA COLI     Labs: BNP (last 3 results) No results for input(s): "BNP" in the last 8760 hours. Basic Metabolic Panel: Recent Labs  Lab 03/27/22 2158 03/28/22 0510 03/29/22 0917 03/30/22 0425  NA 139 139 135 138  K 3.5 3.5 3.3* 3.5  CL 105 108 105 107  CO2 23 20* 24 23  GLUCOSE 113* 121* 109* 96  BUN '14 15 11 9  '$ CREATININE 0.95 0.94 0.74 0.70  CALCIUM 9.4 8.8* 8.5* 8.4*  MG  --   --  1.9 1.5*   Liver Function Tests: Recent Labs  Lab 03/27/22 2158  AST 16  ALT 13  ALKPHOS 41  BILITOT 1.1  PROT 7.7  ALBUMIN 4.5   Recent Labs  Lab 03/27/22 2158  LIPASE <10*   No results for input(s): "AMMONIA" in the last 168 hours. CBC: Recent Labs  Lab 03/27/22 2158 03/28/22 0510 03/29/22 0917 03/30/22 0425  WBC 14.0* 17.6* 13.3* 7.0  NEUTROABS 12.7*  --   --   --   HGB 12.7 13.3 10.4* 9.9*  HCT 38.7 42.5 33.1* 30.8*  MCV 95.8 102.4* 100.0 99.0  PLT 326 278 218 204   Cardiac Enzymes: No results for input(s): "CKTOTAL", "CKMB", "CKMBINDEX", "TROPONINI" in the last 168 hours. BNP: Invalid input(s): "POCBNP" CBG: No results for input(s): "GLUCAP" in the last 168 hours. D-Dimer No results for input(s): "DDIMER" in the last 72 hours. Hgb A1c No results for input(s): "HGBA1C" in the last 72 hours. Lipid Profile No results for input(s): "CHOL", "HDL", "LDLCALC", "TRIG", "CHOLHDL", "LDLDIRECT" in the last 72 hours. Thyroid function studies No results for input(s): "TSH", "T4TOTAL", "T3FREE", "THYROIDAB" in the last 72 hours.  Invalid input(s): "FREET3" Anemia work up No results for input(s): "VITAMINB12", "FOLATE", "FERRITIN", "TIBC", "IRON", "RETICCTPCT" in the last 72 hours. Urinalysis    Component Value Date/Time   COLORURINE YELLOW 03/27/2022 2314   APPEARANCEUR HAZY (A) 03/27/2022 2314   LABSPEC 1.019 03/27/2022 2314    PHURINE 7.5 03/27/2022 2314   GLUCOSEU NEGATIVE 03/27/2022 2314   HGBUR LARGE (A) 03/27/2022 2314   HGBUR trace-intact 01/17/2009 0812   BILIRUBINUR NEGATIVE 03/27/2022 2314   KETONESUR >  80 (A) 03/27/2022 2314   PROTEINUR 30 (A) 03/27/2022 2314   UROBILINOGEN 0.2 03/15/2015 1008   NITRITE POSITIVE (A) 03/27/2022 2314   LEUKOCYTESUR LARGE (A) 03/27/2022 2314   Sepsis Labs Recent Labs  Lab 03/27/22 2158 03/28/22 0510 03/29/22 0917 03/30/22 0425  WBC 14.0* 17.6* 13.3* 7.0   Microbiology Recent Results (from the past 240 hour(s))  Urine Culture     Status: Abnormal   Collection Time: 03/27/22 11:14 PM   Specimen: Urine, Clean Catch  Result Value Ref Range Status   Specimen Description   Final    URINE, CLEAN CATCH Performed at [facility], [address] [place], [place] [address]    Special Requests   Final    NONE Performed at [facility], [address], [place], [place] [address]    Culture >=100,000 COLONIES/mL ESCHERICHIA COLI (A)  Final   Report Status 03/30/2022 FINAL  Final   Organism ID, Bacteria ESCHERICHIA COLI (A)  Final      Susceptibility   Escherichia coli - MIC*    AMPICILLIN <=2 SENSITIVE Sensitive     CEFAZOLIN <=4 SENSITIVE Sensitive     CEFEPIME <=0.12 SENSITIVE Sensitive     CEFTRIAXONE <=0.25 SENSITIVE Sensitive     CIPROFLOXACIN <=0.25 SENSITIVE Sensitive     GENTAMICIN <=1 SENSITIVE Sensitive     IMIPENEM <=0.25 SENSITIVE Sensitive     NITROFURANTOIN <=16 SENSITIVE Sensitive     TRIMETH/SULFA <=20 SENSITIVE Sensitive     AMPICILLIN/SULBACTAM <=2 SENSITIVE Sensitive     PIP/TAZO <=4 SENSITIVE Sensitive     * >=100,000 COLONIES/mL ESCHERICHIA COLI  Urine Culture     Status: Abnormal   Collection Time: 03/28/22  7:33 AM   Specimen: Kidney  Result Value Ref Range Status   Specimen Description   Final    KIDNEY LT RENAL PELVIC CYTOSCOPE URINE Performed at [facility]  [address] [place], [place] [address]    Special Requests   Final    NONE Performed at [facility], [place] [address] [place], [place] [address]    Culture 10,000 COLONIES/mL ESCHERICHIA COLI (A)  Final   Report Status 03/30/2022 FINAL  Final   Organism ID, Bacteria ESCHERICHIA COLI (A)  Final      Susceptibility   Escherichia coli - MIC*    AMPICILLIN 8 SENSITIVE Sensitive     CEFAZOLIN <=4 SENSITIVE Sensitive     CEFEPIME <=0.12 SENSITIVE Sensitive     CEFTAZIDIME <=1 SENSITIVE Sensitive     CEFTRIAXONE <=0.25 SENSITIVE Sensitive     CIPROFLOXACIN <=0.25 SENSITIVE Sensitive     GENTAMICIN <=1 SENSITIVE Sensitive     IMIPENEM <=0.25 SENSITIVE Sensitive     TRIMETH/SULFA <=20 SENSITIVE Sensitive     AMPICILLIN/SULBACTAM <=2 SENSITIVE Sensitive     PIP/TAZO <=4 SENSITIVE Sensitive     * 10,000 COLONIES/mL ESCHERICHIA COLI     Time coordinating discharge: Over 30 minutes  SIGNED:   Annye Forrey J [place] DO  [facility] 03/30/2022, 12:44 PM

## 2022-04-01 ENCOUNTER — Telehealth: Payer: Self-pay | Admitting: Licensed Clinical Social Worker

## 2022-04-01 ENCOUNTER — Other Ambulatory Visit: Payer: Self-pay | Admitting: Urology

## 2022-04-01 NOTE — Patient Outreach (Addendum)
Transition Care Management Unsuccessful Follow-up Telephone Call  Date of discharge and from where:  03/30/22 from [facility]  Attempts:  1st Attempt  Reason for unsuccessful TCM follow-up call:  Left voice message  Eula Fried, BSW, MSW, [employer] [email or web address] Phone: [phone]

## 2022-04-07 DIAGNOSIS — Q283 Other malformations of cerebral vessels: Secondary | ICD-10-CM | POA: Diagnosis not present

## 2022-04-07 DIAGNOSIS — R569 Unspecified convulsions: Secondary | ICD-10-CM | POA: Diagnosis not present

## 2022-04-16 DIAGNOSIS — R569 Unspecified convulsions: Secondary | ICD-10-CM | POA: Diagnosis not present

## 2022-04-17 DIAGNOSIS — N201 Calculus of ureter: Secondary | ICD-10-CM | POA: Diagnosis not present

## 2022-04-17 DIAGNOSIS — R3915 Urgency of urination: Secondary | ICD-10-CM | POA: Diagnosis not present

## 2022-04-28 DIAGNOSIS — N201 Calculus of ureter: Secondary | ICD-10-CM | POA: Diagnosis not present

## 2022-04-30 ENCOUNTER — Encounter (HOSPITAL_BASED_OUTPATIENT_CLINIC_OR_DEPARTMENT_OTHER): Payer: Self-pay | Admitting: Urology

## 2022-04-30 NOTE — Progress Notes (Signed)
Spoke w/ via phone for pre-op interview--- pt Lab needs dos----  urine preg             Lab results------ current EKG in epic / chart COVID test -----patient states asymptomatic no test needed Arrive at ------- 0700 on 05-02-2022 NPO after MN NO Solid Food.  Clear liquids from MN until--- 0600 Med rec completed Medications to take morning of surgery ----- toprol, topamax, valtrex Diabetic medication ----- n/a Patient instructed no nail polish to be worn day of surgery Patient instructed to bring photo id and insurance card day of surgery Patient aware to have Driver (ride ) / caregiver    for 24 hours after surgery -- friend, Seward Carol Patient Special Instructions ----- n/a Pre-Op special Istructions ----- n/a Patient verbalized understanding of instructions that were given at this phone interview. Patient denies shortness of breath, chest pain, fever, cough at this phone interview.

## 2022-05-01 NOTE — Anesthesia Preprocedure Evaluation (Addendum)
Anesthesia Evaluation  Patient identified by MRN, date of birth, ID band Patient awake    Reviewed: Allergy & Precautions, NPO status , Patient's Chart, lab work & pertinent test results, reviewed documented beta blocker date and time   Airway Mallampati: I  TM Distance: >3 FB Neck ROM: Full    Dental  (+) Teeth Intact, Dental Advisory Given   Pulmonary neg pulmonary ROS, Patient abstained from smoking.   Pulmonary exam normal breath sounds clear to auscultation       Cardiovascular Normal cardiovascular exam+ dysrhythmias (palpitations, scheduled for loop insertion later this month)  Rhythm:Regular Rate:Normal  03-27-2022 yncope w/ collapse with recurrent near syncope/ syncope ;  cardiologsit --- dr Alison Murray note 11-11-2021 work-up results in care everywhere event monitor showed benign w/ low burden PACs/ PVCs & evidence inappropriate ST; normal ETT and echo   Neuro/Psych  Headaches, Seizures -, Well Controlled,  PSYCHIATRIC DISORDERS   Bipolar Disorder   04-30-2022  pt stated last syncope/ seizure like activity 03-27-2022 in setting kidney stone pain)   neurologist-- dr Chales Abrahams;   started 04/ 2023 with syncope w/ collapse had seizure like acitivity;   MRI  left frontal cerebral cavenoma in 2011 and last MRI in care everywhere 08-25-2021 same;   EEG 04-16-2022 care everywhere normal without seizure acitivy   Per pt sort of taking topiramate for headaches as well as seizures, took this AM    GI/Hepatic negative GI ROS, Neg liver ROS,,,  Endo/Other  negative endocrine ROS    Renal/GU Renal disease (L ureteral stone)  negative genitourinary   Musculoskeletal negative musculoskeletal ROS (+)    Abdominal   Peds  Hematology negative hematology ROS (+)   Anesthesia Other Findings   Reproductive/Obstetrics                             Anesthesia Physical Anesthesia Plan  ASA: 3  Anesthesia Plan:  General   Post-op Pain Management: Tylenol PO (pre-op)*, Toradol IV (intra-op)* and Precedex   Induction: Intravenous  PONV Risk Score and Plan: 4 or greater and Ondansetron, Dexamethasone, Midazolam and Treatment may vary due to age or medical condition  Airway Management Planned: LMA  Additional Equipment: None  Intra-op Plan:   Post-operative Plan: Extubation in OR  Informed Consent: I have reviewed the patients History and Physical, chart, labs and discussed the procedure including the risks, benefits and alternatives for the proposed anesthesia with the patient or authorized representative who has indicated his/her understanding and acceptance.     Dental advisory given  Plan Discussed with: CRNA  Anesthesia Plan Comments:        Anesthesia Quick Evaluation

## 2022-05-02 ENCOUNTER — Ambulatory Visit (HOSPITAL_BASED_OUTPATIENT_CLINIC_OR_DEPARTMENT_OTHER)
Admission: RE | Admit: 2022-05-02 | Discharge: 2022-05-02 | Disposition: A | Discharge status: Home / Self Care | Payer: Medicaid Other | Attending: Urology | Admitting: Urology

## 2022-05-02 ENCOUNTER — Ambulatory Visit (HOSPITAL_BASED_OUTPATIENT_CLINIC_OR_DEPARTMENT_OTHER): Payer: Medicaid Other | Admitting: Anesthesiology

## 2022-05-02 ENCOUNTER — Encounter (HOSPITAL_BASED_OUTPATIENT_CLINIC_OR_DEPARTMENT_OTHER)
Admission: RE | Disposition: A | Discharge status: Home / Self Care | Payer: Self-pay | Source: Home / Self Care | Attending: Urology

## 2022-05-02 ENCOUNTER — Other Ambulatory Visit: Payer: Self-pay

## 2022-05-02 ENCOUNTER — Encounter (HOSPITAL_BASED_OUTPATIENT_CLINIC_OR_DEPARTMENT_OTHER): Payer: Self-pay | Admitting: Urology

## 2022-05-02 DIAGNOSIS — N201 Calculus of ureter: Secondary | ICD-10-CM | POA: Diagnosis not present

## 2022-05-02 DIAGNOSIS — I499 Cardiac arrhythmia, unspecified: Secondary | ICD-10-CM | POA: Diagnosis not present

## 2022-05-02 DIAGNOSIS — N200 Calculus of kidney: Secondary | ICD-10-CM | POA: Insufficient documentation

## 2022-05-02 DIAGNOSIS — F319 Bipolar disorder, unspecified: Secondary | ICD-10-CM

## 2022-05-02 DIAGNOSIS — R002 Palpitations: Secondary | ICD-10-CM | POA: Insufficient documentation

## 2022-05-02 DIAGNOSIS — R569 Unspecified convulsions: Secondary | ICD-10-CM | POA: Diagnosis not present

## 2022-05-02 DIAGNOSIS — Z01818 Encounter for other preprocedural examination: Secondary | ICD-10-CM

## 2022-05-02 HISTORY — DX: Iron deficiency anemia, unspecified: D50.9

## 2022-05-02 HISTORY — DX: Personal history of urinary calculi: Z87.442

## 2022-05-02 HISTORY — PX: CYSTOSCOPY/URETEROSCOPY/HOLMIUM LASER/STENT PLACEMENT: SHX6546

## 2022-05-02 HISTORY — DX: Calculus of ureter: N20.1

## 2022-05-02 HISTORY — DX: Palpitations: R00.2

## 2022-05-02 HISTORY — DX: Urgency of urination: R39.15

## 2022-05-02 HISTORY — DX: Unspecified convulsions: R56.9

## 2022-05-02 HISTORY — DX: Presence of spectacles and contact lenses: Z97.3

## 2022-05-02 HISTORY — DX: Carpal tunnel syndrome, bilateral upper limbs: G56.03

## 2022-05-02 HISTORY — DX: Personal history of other specified conditions: Z87.898

## 2022-05-02 LAB — POCT PREGNANCY, URINE: Preg Test, Ur: NEGATIVE

## 2022-05-02 SURGERY — CYSTOSCOPY/URETEROSCOPY/HOLMIUM LASER/STENT PLACEMENT
Anesthesia: General | Site: Ureter | Laterality: Left

## 2022-05-02 MED ORDER — SODIUM CHLORIDE 0.9 % IR SOLN
Status: DC | PRN
  Administered 2022-05-02: 3000 mL

## 2022-05-02 MED ORDER — HYDROMORPHONE HCL 1 MG/ML IJ SOLN: 0.2500 mg | INTRAMUSCULAR | Status: DC | PRN

## 2022-05-02 MED ORDER — LACTATED RINGERS IV SOLN: INTRAVENOUS | Status: DC

## 2022-05-02 MED ORDER — CEFAZOLIN SODIUM-DEXTROSE 2-4 GM/100ML-% IV SOLN
INTRAVENOUS | Status: AC
  Filled 2022-05-02: qty 100

## 2022-05-02 MED ORDER — CEPHALEXIN 500 MG PO CAPS: 500.0000 mg | ORAL_CAPSULE | Freq: Two times a day (BID) | ORAL | 0 refills | Status: AC

## 2022-05-02 MED ORDER — MIDAZOLAM HCL 2 MG/2ML IJ SOLN
INTRAMUSCULAR | Status: AC
  Filled 2022-05-02: qty 2

## 2022-05-02 MED ORDER — ONDANSETRON HCL 4 MG/2ML IJ SOLN
INTRAMUSCULAR | Status: AC
  Filled 2022-05-02: qty 2

## 2022-05-02 MED ORDER — DEXAMETHASONE SODIUM PHOSPHATE 10 MG/ML IJ SOLN
INTRAMUSCULAR | Status: AC
  Filled 2022-05-02: qty 1

## 2022-05-02 MED ORDER — KETOROLAC TROMETHAMINE 30 MG/ML IJ SOLN: 30.0000 mg | Freq: Once | INTRAMUSCULAR | Status: DC | PRN

## 2022-05-02 MED ORDER — DEXAMETHASONE SODIUM PHOSPHATE 4 MG/ML IJ SOLN
INTRAMUSCULAR | Status: DC | PRN
  Administered 2022-05-02: 8 mg via INTRAVENOUS

## 2022-05-02 MED ORDER — OXYCODONE-ACETAMINOPHEN 5-325 MG PO TABS: 1.0000 | ORAL_TABLET | ORAL | 0 refills | Status: DC | PRN

## 2022-05-02 MED ORDER — ACETAMINOPHEN 500 MG PO TABS
ORAL_TABLET | ORAL | Status: AC
  Filled 2022-05-02: qty 2

## 2022-05-02 MED ORDER — FENTANYL CITRATE (PF) 100 MCG/2ML IJ SOLN
INTRAMUSCULAR | Status: AC
  Filled 2022-05-02: qty 2

## 2022-05-02 MED ORDER — KETOROLAC TROMETHAMINE 30 MG/ML IJ SOLN
INTRAMUSCULAR | Status: AC
  Filled 2022-05-02: qty 1

## 2022-05-02 MED ORDER — LIDOCAINE HCL (CARDIAC) PF 100 MG/5ML IV SOSY
PREFILLED_SYRINGE | INTRAVENOUS | Status: DC | PRN
  Administered 2022-05-02: 60 mg via INTRAVENOUS

## 2022-05-02 MED ORDER — AMISULPRIDE (ANTIEMETIC) 5 MG/2ML IV SOLN: 10.0000 mg | Freq: Once | INTRAVENOUS | Status: DC | PRN

## 2022-05-02 MED ORDER — SUCCINYLCHOLINE CHLORIDE 200 MG/10ML IV SOSY
PREFILLED_SYRINGE | INTRAVENOUS | Status: AC
  Filled 2022-05-02: qty 10

## 2022-05-02 MED ORDER — ONDANSETRON HCL 4 MG/2ML IJ SOLN: 4.0000 mg | Freq: Once | INTRAMUSCULAR | Status: DC | PRN

## 2022-05-02 MED ORDER — ONDANSETRON HCL 4 MG/2ML IJ SOLN
INTRAMUSCULAR | Status: DC | PRN
  Administered 2022-05-02: 4 mg via INTRAVENOUS

## 2022-05-02 MED ORDER — MIDAZOLAM HCL 2 MG/2ML IJ SOLN
INTRAMUSCULAR | Status: DC | PRN
  Administered 2022-05-02: 2 mg via INTRAVENOUS

## 2022-05-02 MED ORDER — CEFAZOLIN SODIUM-DEXTROSE 2-4 GM/100ML-% IV SOLN
2.0000 g | INTRAVENOUS | Status: AC
  Administered 2022-05-02: 2 g via INTRAVENOUS

## 2022-05-02 MED ORDER — SUCCINYLCHOLINE CHLORIDE 200 MG/10ML IV SOSY
PREFILLED_SYRINGE | INTRAVENOUS | Status: DC | PRN
  Administered 2022-05-02: 40 mg via INTRAVENOUS

## 2022-05-02 MED ORDER — FENTANYL CITRATE (PF) 100 MCG/2ML IJ SOLN
INTRAMUSCULAR | Status: DC | PRN
  Administered 2022-05-02: 50 ug via INTRAVENOUS

## 2022-05-02 MED ORDER — OXYCODONE HCL 5 MG PO TABS
ORAL_TABLET | ORAL | Status: AC
  Filled 2022-05-02: qty 1

## 2022-05-02 MED ORDER — DOCUSATE SODIUM 100 MG PO CAPS: 100.0000 mg | ORAL_CAPSULE | Freq: Every day | ORAL | 0 refills | Status: AC | PRN

## 2022-05-02 MED ORDER — PROPOFOL 10 MG/ML IV BOLUS
INTRAVENOUS | Status: DC | PRN
  Administered 2022-05-02: 150 mg via INTRAVENOUS

## 2022-05-02 MED ORDER — OXYCODONE HCL 5 MG PO TABS
5.0000 mg | ORAL_TABLET | Freq: Once | ORAL | Status: AC | PRN
  Administered 2022-05-02: 5 mg via ORAL

## 2022-05-02 MED ORDER — ACETAMINOPHEN 500 MG PO TABS
1000.0000 mg | ORAL_TABLET | Freq: Once | ORAL | Status: AC
  Administered 2022-05-02: 1000 mg via ORAL

## 2022-05-02 MED ORDER — MEPERIDINE HCL 25 MG/ML IJ SOLN: 6.2500 mg | INTRAMUSCULAR | Status: DC | PRN

## 2022-05-02 MED ORDER — IOHEXOL 300 MG/ML  SOLN
INTRAMUSCULAR | Status: DC | PRN
  Administered 2022-05-02: 6 mL via URETHRAL

## 2022-05-02 MED ORDER — OXYCODONE HCL 5 MG/5ML PO SOLN: 5.0000 mg | Freq: Once | ORAL | Status: AC | PRN

## 2022-05-02 SURGICAL SUPPLY — 31 items
APL SKNCLS STERI-STRIP NONHPOA (GAUZE/BANDAGES/DRESSINGS) ×2
BAG DRAIN URO-CYSTO SKYTR STRL (DRAIN) ×1 IMPLANT
BAG DRN UROCATH (DRAIN) ×1
BASKET ZERO TIP NITINOL 2.4FR (BASKET) IMPLANT
BENZOIN TINCTURE PRP APPL 2/3 (GAUZE/BANDAGES/DRESSINGS) IMPLANT
BSKT STON RTRVL ZERO TP 2.4FR (BASKET) ×1
CATH SET URETHRAL DILATOR (CATHETERS) IMPLANT
CATH URETL OPEN 5X70 (CATHETERS) ×1 IMPLANT
CLOTH BEACON ORANGE TIMEOUT ST (SAFETY) ×1 IMPLANT
DRSG TEGADERM 2-3/8X2-3/4 SM (GAUZE/BANDAGES/DRESSINGS) IMPLANT
FIBER LASER FLEXIVA 365 (UROLOGICAL SUPPLIES) IMPLANT
GLOVE BIO SURGEON STRL SZ7 (GLOVE) ×1 IMPLANT
GLOVE BIOGEL PI IND STRL 6 (GLOVE) IMPLANT
GLOVE ECLIPSE 6.0 STRL STRAW (GLOVE) IMPLANT
GOWN STRL REUS W/TWL LRG LVL3 (GOWN DISPOSABLE) ×1 IMPLANT
GUIDEWIRE STR DUAL SENSOR (WIRE) ×1 IMPLANT
GUIDEWIRE ZIPWRE .038 STRAIGHT (WIRE) IMPLANT
IV NS 1000ML (IV SOLUTION) ×1
IV NS 1000ML BAXH (IV SOLUTION) ×1 IMPLANT
IV NS IRRIG 3000ML ARTHROMATIC (IV SOLUTION) ×1 IMPLANT
KIT TURNOVER CYSTO (KITS) ×1 IMPLANT
MANIFOLD NEPTUNE II (INSTRUMENTS) ×1 IMPLANT
NS IRRIG 500ML POUR BTL (IV SOLUTION) ×1 IMPLANT
PACK CYSTO (CUSTOM PROCEDURE TRAY) ×1 IMPLANT
STENT URET 6FRX26 CONTOUR (STENTS) IMPLANT
SYR 10ML LL (SYRINGE) ×1 IMPLANT
TRACTIP FLEXIVA PULS ID 200XHI (Laser) IMPLANT
TRACTIP FLEXIVA PULSE ID 200 (Laser) ×1
TUBE CONNECTING 12X1/4 (SUCTIONS) ×1 IMPLANT
TUBE FEEDING 8FR 16IN STR KANG (MISCELLANEOUS) IMPLANT
TUBING UROLOGY SET (TUBING) ×1 IMPLANT

## 2022-05-02 NOTE — Op Note (Signed)
Operative Note  Preoperative diagnosis:  1.  Left ureteral stones  Postoperative diagnosis: 1.  Same  Procedure(s): 1.  Cystoscopy 2. Left ureteroscopy with laser lithotripsy and basket extraction of stones 3. Left retrograde pyelogram 4. Left ureteral stent exchange 5. Fluoroscopy with intraoperative interpretation  Surgeon: Rexene Alberts, MD  Assistants:  None  Anesthesia:  General  Complications:  None  EBL:  Minimal  Specimens: 1. Stones for stone analysis (to be done at [facility])  Drains/Catheters: 1.  6Fr x 26cm ureteral stent with a tether string  Intraoperative findings:   Cystoscopy demonstrated no suspicious bladder lesions. Left ureteroscopy demonstrated 2 separate impacted left ureteral stones in the left mid ureter each fragmented and basket extracted. Separate punctate stone in the left renal pelvis, basket extracted. Left retrograde pyelogram with no hydronephrosis. Successful left ureteral stent exchange.  Indication:  Marie Brown is a 31 y.o. female with a history of obstructing left ureteral stones with infection.  She underwent left ureteral stent placement on 03/28/2022.  Urine culture resulted E. coli.  She had persistent infection with E. coli.  She completed a preoperative course of antibiotics and urine culture 04/28/2022 resulted no growth.  She presents today for definitive treatment of her stones.  Description of procedure: After informed consent was obtained from the patient, the patient was identified and taken to the operating room and placed in the supine position.  General anesthesia was administered as well as perioperative IV antibiotics.  At the beginning of the case, a time-out was performed to properly identify the patient, the surgery to be performed, and the surgical site.  Sequential compression devices were applied to the lower extremities at the beginning of the case for DVT prophylaxis.  The patient was then placed in the  dorsal lithotomy supine position, prepped and draped in sterile fashion.  We then passed the 21-French rigid cystoscope through the urethra and into the bladder under vision without any difficulty, noting a normal urethra. A systematic evaluation of the bladder revealed no evidence of any suspicious bladder lesions.  Ureteral orifices were in normal position.    The distal aspect of the ureteral stent was seen protruding from the left ureteral orifice.  We then used the alligator-tooth forceps and grasped the distal end of the ureteral stent and brought it out the urethral meatus while watching the proximal coil straighten out nicely on fluoroscopy. Through the ureteral stent, we then passed a 0.038 sensor wire up to the level of the renal pelvis.  The ureteral stent was then removed, leaving the sensor wire up the left ureter.    Under cystoscopic and flouroscopic guidance, we cannulated the left ureteral orifice with a 5-French open-ended ureteral catheter and a gentle retrograde pyelogram was performed, revealing a normal caliber ureter without any filling defects. There was no hydronephrosis of the collecting system. A 0.038 sensor wire was then passed up to the level of the renal pelvis and secured to the drape as a safety wire. The ureteral catheter and cystoscope were removed, leaving the safety wire in place.   A semi-rigid ureteroscope was passed alongside the wire up the distal ureter which appeared normal.  I encounter to separate approximately 5 mm stones in her left mid ureter that were impacted.  Using 200 m laser fiber, the stones were fragmented and then basket extracted with a 0 tip basket.  These were removed without trauma.  I then passed a separate 0.038 sensor wire to the level renal pelvis.  Over this wire, passed a dual-lumen flexible ureteroscope.  I surveyed the kidney.  There is a punctate stone that was removed with a 0 tip basket.  I then performed retrograde pyelogram  demonstrating no further stone fragments after surgery and each calyx x 2.  We then withdrew the ureteroscope back down the ureter noting no evidence of any stones along the course of the ureter.  Prior to removing the ureteroscope, we did pass the Glidewire back up to the ureter to the renal pelvis.  Once the ureteroscope was removed, we then used the Glidewire under fluoroscopic guidance and passed up a 6-French x 26 cm double-pigtail ureteral stent up the ureter, making sure that the proximal and distal ends coiled within the kidney and bladder respectively.  Note that we left a long tether string attached to the distal end of the ureteral stent and it exited the urethral meatus and was secured to the inner thigh with a tegaderm adhesive.  The cystoscope was then advanced back into the bladder under vision.  We were able to see the distal stent coiling nicely within the bladder.  The bladder was then emptied with irrigation solution.  The cystoscope was then removed.    The patient tolerated the procedure well and there was no complication. Patient was awoken from anesthesia and taken to the recovery room in stable condition. I was present and scrubbed for the entirety of the case.  Plan:  Patient will be discharged home.  She will remove her ureteral stent on Monday morning.  She will follow-up in the office in 1 month with renal ultrasound.   Matt R. [facility]  Pager: [phone]

## 2022-05-02 NOTE — H&P (Signed)
Office Visit Report     04/17/2022    CC/HPI: Marie Brown is a 31 year old female who is seen in consultation today for history of ureteral stones.   1. Left ureteral stones: She presented to ED on 03/28/2022 with complaints of left lower quad abdominal pain, vomiting. CT A/P demonstrated 2 separate obstructing 4 and 3 mm stone at the mid left ureter with associated left hydroureteronephrosis. She was also found to have evidence of infection with leukocytosis of 14. She underwent left ureteral stent placement on 03/28/2022. Urine culture resulted 10K E. coli. She completed course of antibiotics. She denies abdominal pain, flank pain, fevers, chills.   #2. Urinary urgency: She states that since stent placement, she has had urinary urgency as well as intermittent left-sided stent discomfort. She denies fevers or chills.   Patient currently denies fever, chills, sweats, nausea, vomiting, abdominal or flank pain, gross hematuria or dysuria.     ALLERGIES: No Allergies    MEDICATIONS: None   GU PSH: No GU PSH    NON-GU PSH: No Non-GU PSH    GU PMH: No GU PMH    NON-GU PMH: No Non-GU PMH    FAMILY HISTORY: No Family History    SOCIAL HISTORY: No Social History    REVIEW OF SYSTEMS:    GU Review Female:   Patient denies frequent urination, hard to postpone urination, burning /pain with urination, get up at night to urinate, leakage of urine, stream starts and stops, trouble starting your stream, have to strain to urinate, and being pregnant.  Gastrointestinal (Upper):   Patient denies nausea, vomiting, and indigestion/ heartburn.  Gastrointestinal (Lower):   Patient denies diarrhea and constipation.  Constitutional:   Patient denies fever, night sweats, weight loss, and fatigue.  Skin:   Patient denies skin rash/ lesion and itching.  Eyes:   Patient denies blurred vision and double vision.  Ears/ Nose/ Throat:   Patient denies sore throat and sinus problems.  Hematologic/Lymphatic:    Patient denies swollen glands and easy bruising.  Cardiovascular:   Patient denies leg swelling and chest pains.  Respiratory:   Patient denies cough and shortness of breath.  Endocrine:   Patient denies excessive thirst.  Musculoskeletal:   Patient denies back pain and joint pain.  Neurological:   Patient denies headaches and dizziness.  Psychologic:   Patient denies depression and anxiety.   VITAL SIGNS:      04/17/2022 12:47 PM  Weight 162 lb / 73.48 kg  Height 63 in / 160.02 cm  BP 123/82 mmHg  Pulse 65 /min  Temperature 97.3 F / 36.2 C  BMI 28.7 kg/m   MULTI-SYSTEM PHYSICAL EXAMINATION:    Constitutional: Well-nourished. No physical deformities. Normally developed. Good grooming.  Respiratory: No labored breathing, no use of accessory muscles.   Cardiovascular: Normal temperature, normal extremity pulses, no swelling, no varicosities.  Gastrointestinal: No mass, no tenderness, no rigidity, non obese abdomen.     Complexity of Data:  Source Of History:  Patient, Medical Record Summary  Records Review:   Previous Doctor Records  Urine Test Review:   Urinalysis  X-Ray Review: C.T. Abdomen/Pelvis: Reviewed Films. Reviewed Report. Discussed With Patient.     PROCEDURES:          Urinalysis w/Scope Dipstick Dipstick Cont'd Micro  Color: Brown Bilirubin: Neg mg/dL WBC/hpf: 10 - 20/hpf  Appearance: Cloudy Ketones: Neg mg/dL RBC/hpf: 40 - 60/hpf  Specific Gravity: 1.025 Blood: 3+ ery/uL Bacteria: Mod (26-50/hpf)  pH: 6.5 Protein: 1+  mg/dL Cystals: NS (Not Seen)  Glucose: Neg mg/dL Urobilinogen: 0.2 mg/dL Casts: NS (Not Seen)    Nitrites: Neg Trichomonas: Not Present    Leukocyte Esterase: 3+ leu/uL Mucous: Present      Epithelial Cells: 0 - 5/hpf      Yeast: NS (Not Seen)      Sperm: Not Present    Notes: Unspun micro due to increased RBC's.    ASSESSMENT:      ICD-10 Details  1 GU:   Ureteral calculus - N20.1   2   Urinary Urgency - R39.15    PLAN:             Medications New Meds: Oxybutynin Chloride Er 5 mg tablet, extended release 24 hr 1 tablet PO Daily   #30  1 Refill(s)  Pharmacy Name:  [id number]  Address:  [address]   [place], [place] [id number]  Phone:  [phone]  Fax:  [phone]            Orders Labs CULTURE, URINE          Document Letter(s):  Created for Patient: Clinical Summary         Notes:    1. Left ureteral stones:  -CT A/P 03/28/2022 with 2 separate 4 and 3 mm left mid ureteral stones. S/p left ureteral stent on 03/28/2022.  -Will send urine for culture today.  -She is scheduled for left ureteroscopy with laser lithotripsy and basket traction of stone on 05/02/2022. Discussed procedure and expected course.  -Will plan for postop renal ultrasound 4 to 6 weeks after.   We discussed the options for management of kidney stones, including observation, ESWL, ureteroscopy with laser lithotripsy, and PCNL. The risks and benefits of each option were discussed.  For observation I described the risks which include but are not limited to silent renal damage, life-threatening infection, need for emergent surgery, failure to pass stone, and pain.   ESWL: risks and benefits of ESWL were outlined including infection, bleeding, pain, steinstrasse, kidney injury, need for ancillary treatments, and global anesthesia risks including but not limited to CVA, MI, DVT, PE, pneumonia, and death.   Ureteroscopy: risks and benefits of ureteroscopy were outlined, including infection, bleeding, pain, temporary ureteral stent and associated stent bother, ureteral injury, ureteral stricture, need for ancillary treatments, and global anesthesia risks including but not limited to CVA, MI, DVT, PE, pneumonia, and death.   #2. urinary urgency: Will treat with oxybutynin.    Urology Preoperative H&P   Chief Complaint: Left ureteral stones  History of Present Illness: Marie Brown is a 31 y.o. female with 2  separate left mid ureteral stones here for cystoscopy, left retrograde pyelogram, left ureteroscopy with laser lithotripsy and basket extraction of stones, left ureteral stent exchange. She denies fevers, chills, dysuria. She completed preoperative abx and Ucx 04/28/2022 resulted no growth.      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 ;   last MRI in care everywhere 08-25-2021   History of acute pyelonephritis 03/28/2022   admission in epic w/ UTI due to obstructive uropathy due to kidney stone   History of kidney stones    History of syncope    (04-30-2022  pt stated last episode 03-27-2022  in setting kidney stone pain )   syncope w/ collapse with recurrent near  syncope/ syncope ;   cardiologsit --- dr Alison Murray note 11-11-2021 work-up results in care everywhere event monitor showed benign w/ low burden PACs/ PVCs & evidence inappropriate ST;  normal ETT and echo   IDA (iron deficiency anemia)    Left ureteral calculus    Migraine    Palpitations    (04-30-2022  pt stated taking toprol daily has helped)   pt is scheduled for loop recorder insertion 05-08-2022 @ [facility]   Seizure-like activity ([place])    (04-30-2022  pt stated last syncope/ seizure like activity 03-27-2022 in setting kidney stone pain)   neurologist-- dr Johnette Abraham. Mike[facility] Hawthorne;   started 04/ 2023 with syncope w/ collapse had seizure like acitivity;   MRI  left frontal cerebral cavenoma in 2011 and last MRI in care everywhere 08-25-2021 same;   EEG 04-16-2022 care everywhere normal without seizure acitivy   Urgency of urination    Wears glasses     Past Surgical History:  Procedure Laterality Date   CYSTOSCOPY W/ URETERAL STENT PLACEMENT Left 03/28/2022   Procedure: CYSTOSCOPY WITH RETROGRADE PYELOGRAM/URETERAL STENT PLACEMENT;  Surgeon: Janith Lima, MD;  Location: [facility];  Service: Urology;  Laterality: Left;   FOOT SURGERY  Bilateral 2005   bunion   LAPAROSCOPIC APPENDECTOMY  12/20/2009   '@[facility]'$  by dr b. zeigler    Allergies: No Known Allergies  Family History  Problem Relation Age of Onset   Hypertension Mother    Diabetes Other    Thyroid disease Other    Heart disease Other    Birth defects Other    Mental illness Other    Stroke Other    Breast cancer Other    Graves' disease Maternal Grandmother    Multiple sclerosis Maternal Grandfather    Suicidality Father     Social History:  reports that she has never smoked. She has never used smokeless tobacco. She reports that she does not currently use alcohol. She reports that she does not use drugs.  ROS: A complete review of systems was performed.  All systems are negative except for pertinent findings as noted.  Physical Exam:  Vital signs in last 24 hours:   Constitutional:  Alert and oriented, No acute distress Cardiovascular: Regular rate and rhythm Respiratory: Normal respiratory effort, Lungs clear bilaterally GI: Abdomen is soft, nontender, nondistended, no abdominal masses GU: No CVA tenderness Lymphatic: No lymphadenopathy Neurologic: Grossly intact, no focal deficits Psychiatric: Normal mood and affect  Laboratory Data:  No results for input(s): "WBC", "HGB", "HCT", "PLT" in the last 72 hours.  No results for input(s): "NA", "K", "CL", "GLUCOSE", "BUN", "CALCIUM", "CREATININE" in the last 72 hours.  Invalid input(s): "CO3"   Results for orders placed or performed during the hospital encounter of 05/02/22 (from the past 24 hour(s))  Pregnancy, urine POC     Status: None   Collection Time: 05/02/22  7:17 AM  Result Value Ref Range   Preg Test, Ur NEGATIVE NEGATIVE   No results found for this or any previous visit (from the past 240 hour(s)).  Renal Function: No results for input(s): "CREATININE" in the last 168 hours. CrCl cannot be calculated (Patient's most recent lab result is older than the maximum 21 days  allowed.).  Radiologic Imaging: No results found.  I independently reviewed the above imaging studies.  Assessment and Plan Marie Brown is a 31 y.o. female with 2 separate left mid ureteral stones here for cystoscopy, left retrograde pyelogram, left ureteroscopy with laser lithotripsy and  basket extraction of stones, left ureteral stent exchange.   -The risks, benefits and alternatives of cystoscopy, left retrograde pyelogram, left ureteroscopy with laser lithotripsy and basket extraction of stones, left ureteral stent exchange was discussed with the patient.  Risks include, but are not limited to: bleeding, urinary tract infection, ureteral injury, ureteral stricture disease, chronic pain, urinary symptoms, bladder injury, stent migration, the need for nephrostomy tube placement, MI, CVA, DVT, PE and the inherent risks with general anesthesia.  The patient voices understanding and wishes to proceed.   Matt R. Draken Farrior MD 05/02/2022, 7:25 AM  [facility] Pager: [phone]): [phone]

## 2022-05-02 NOTE — Discharge Instructions (Addendum)
[facility] [phone] Post Ureteroscopy With or Without Stent Instructions  Definitions:  Ureter: The duct that transports urine from the kidney to the bladder. Stent:   A plastic hollow tube that is placed into the ureter, from the kidney to the bladder to prevent the ureter from swelling shut.  GENERAL INSTRUCTIONS:  Despite the fact that no skin incisions were used, the area around the ureter and bladder is raw and irritated. The stent is a foreign body which will further irritate the bladder wall. This irritation is manifested by increased frequency of urination, both day and night, and by an increase in the urge to urinate. In some, the urge to urinate is present almost always. Sometimes the urge is strong enough that you may not be able to stop yourself from urinating. The only real cure is to remove the stent and then give time for the bladder wall to heal which can't be done until the danger of the ureter swelling shut has passed, which varies.  You may see some blood in your urine while the stent is in place and a few days afterwards. Do not be alarmed, even if the urine was clear for a while. Get off your feet and drink lots of fluids until clearing occurs. If you start to pass clots or don't improve, call us.  DIET: You may return to your normal diet immediately. Because of the raw surface of your bladder, alcohol, spicy foods, acid type foods and drinks with caffeine may cause irritation or frequency and should be used in moderation. To keep your urine flowing freely and to avoid constipation, drink plenty of fluids during the day ( 8-10 glasses ). Tip: Avoid cranberry juice because it is very acidic.  ACTIVITY: Your physical activity doesn't need to be restricted. However, if you are very active, you may see some blood in your urine. We suggest that you reduce your activity under these circumstances until the bleeding has stopped.  BOWELS: It is important to  keep your bowels regular during the postoperative period. Straining with bowel movements can cause bleeding. A bowel movement every other day is reasonable. Use a mild laxative if needed, such as Milk of Magnesia 2-3 tablespoons, or 2 Dulcolax tablets. Call if you continue to have problems. If you have been taking narcotics for pain, before, during or after your surgery, you may be constipated. Take a laxative if necessary.   MEDICATION: You should resume your pre-surgery medications unless told not to. In addition you will often be given an antibiotic to prevent infection. These should be taken as prescribed until the bottles are finished unless you are having an unusual reaction to one of the drugs.  PROBLEMS YOU SHOULD REPORT TO [place]: Fevers over 100.5 Fahrenheit. Heavy bleeding, or clots ( See above notes about blood in urine ). Inability to urinate. Drug reactions ( hives, rash, nausea, vomiting, diarrhea ). Severe burning or pain with urination that is not improving.  FOLLOW-UP: You will need a follow-up appointment to monitor your progress. Call for this appointment at the number listed above. Usually the first appointment will be about three to fourteen days after your surgery.  You have a stent draining your kidney and this may be removed on Monday morning by pulling on the attached string.         No acetaminophen/Tylenol until after 1:36 pm today if needed.  Oxycodone '5mg'$  was given at 9:54am. Next dose if needed can be taken at 1:54pm today.  Post Anesthesia Home Care Instructions  Activity: Get plenty of rest for the remainder of the day. A responsible individual must stay with you for 24 hours following the procedure.  For the next 24 hours, DO NOT: -Drive a car -Paediatric nurse -Drink alcoholic beverages -Take any medication unless instructed by your physician -Make any legal decisions or sign important papers.  Meals: Start with liquid  foods such as gelatin or soup. Progress to regular foods as tolerated. Avoid greasy, spicy, heavy foods. If nausea and/or vomiting occur, drink only clear liquids until the nausea and/or vomiting subsides. Call your physician if vomiting continues.  Special Instructions/Symptoms: Your throat may feel dry or sore from the anesthesia or the breathing tube placed in your throat during surgery. If this causes discomfort, gargle with warm salt water. The discomfort should disappear within 24 hours.

## 2022-05-02 NOTE — Transfer of Care (Signed)
Immediate Anesthesia Transfer of Care Note  Patient: Marie Brown  Procedure(s) Performed: CYSTOSCOPY/LEFT URETEROSCOPY/HOLMIUM LASER/LEFT RETROGRADE PYELOGRAM/LEFT STENT PLACEMENT (Left: Ureter)  Patient Location: PACU  Anesthesia Type:General  Level of Consciousness: awake, alert , oriented, and patient cooperative  Airway & Oxygen Therapy: Patient Spontanous Breathing and Patient connected to nasal cannula oxygen  Post-op Assessment: Report given to RN and Post -op Vital signs reviewed and stable  Post vital signs: Reviewed and stable  Last Vitals:  Vitals Value Taken Time  BP    Temp    Pulse 71 05/02/22 0942  Resp 15 05/02/22 0942  SpO2 100 % 05/02/22 0942  Vitals shown include unvalidated device data.  Last Pain:  Vitals:   05/02/22 0732  TempSrc: Oral  PainSc: 0-No pain      Patients Stated Pain Goal: 7 (82/42/35 3614)  Complications: No notable events documented.

## 2022-05-02 NOTE — Anesthesia Postprocedure Evaluation (Addendum)
Anesthesia Post Note  Patient: Marie Brown  Procedure(s) Performed: CYSTOSCOPY/LEFT URETEROSCOPY/HOLMIUM LASER/LEFT RETROGRADE PYELOGRAM/LEFT STENT PLACEMENT (Left: Ureter)     Patient location during evaluation: PACU Anesthesia Type: General Level of consciousness: awake and alert, oriented and patient cooperative Pain management: pain level controlled Vital Signs Assessment: post-procedure vital signs reviewed and stable Respiratory status: spontaneous breathing, nonlabored ventilation and respiratory function stable Cardiovascular status: blood pressure returned to baseline and stable Postop Assessment: no apparent nausea or vomiting Anesthetic complications: yes Comments: Laryngospasm on emergence treating w/ succinylcholine    No notable events documented.  Last Vitals:  Vitals:   05/02/22 0945 05/02/22 1000  BP: (!) 134/90 126/67  Pulse: 60 (!) 53  Resp: 14 14  Temp:    SpO2: 100% 100%    Last Pain:  Vitals:   05/02/22 0941  TempSrc:   PainSc: 0-No pain                 Pervis Hocking

## 2022-05-02 NOTE — Addendum Note (Signed)
Addendum  created 05/02/22 1026 by Pervis Hocking, DO   Clinical Note Signed

## 2022-05-02 NOTE — Anesthesia Procedure Notes (Signed)
Procedure Name: LMA Insertion Date/Time: 05/02/2022 8:53 AM  Performed by: Georgeanne Nim, CRNAPre-anesthesia Checklist: Patient identified, Emergency Drugs available, Suction available, Patient being monitored and Timeout performed Patient Re-evaluated:Patient Re-evaluated prior to induction Oxygen Delivery Method: Circle system utilized Preoxygenation: Pre-oxygenation with 100% oxygen Induction Type: IV induction Ventilation: Mask ventilation without difficulty LMA: LMA inserted LMA Size: 4.0 Placement Confirmation: breath sounds checked- equal and bilateral and positive ETCO2

## 2022-05-06 ENCOUNTER — Encounter (HOSPITAL_BASED_OUTPATIENT_CLINIC_OR_DEPARTMENT_OTHER): Payer: Self-pay | Admitting: Urology

## 2022-05-08 DIAGNOSIS — R55 Syncope and collapse: Secondary | ICD-10-CM | POA: Diagnosis not present

## 2022-05-08 DIAGNOSIS — R002 Palpitations: Secondary | ICD-10-CM | POA: Diagnosis not present

## 2022-05-08 DIAGNOSIS — Z95818 Presence of other cardiac implants and grafts: Secondary | ICD-10-CM | POA: Insufficient documentation

## 2022-06-02 DIAGNOSIS — N201 Calculus of ureter: Secondary | ICD-10-CM | POA: Diagnosis not present

## 2022-06-02 DIAGNOSIS — R8271 Bacteriuria: Secondary | ICD-10-CM | POA: Diagnosis not present

## 2022-07-03 DIAGNOSIS — G43109 Migraine with aura, not intractable, without status migrainosus: Secondary | ICD-10-CM | POA: Diagnosis not present

## 2022-07-17 ENCOUNTER — Encounter: Payer: Self-pay | Admitting: Radiology

## 2022-07-21 ENCOUNTER — Other Ambulatory Visit (HOSPITAL_BASED_OUTPATIENT_CLINIC_OR_DEPARTMENT_OTHER): Payer: Self-pay

## 2022-07-21 MED ORDER — ONDANSETRON 8 MG PO TBDP
8.0000 mg | ORAL_TABLET | Freq: Three times a day (TID) | ORAL | 0 refills | Status: DC
  Filled 2022-07-21: qty 20, 7d supply, fill #0

## 2022-07-23 DIAGNOSIS — N200 Calculus of kidney: Secondary | ICD-10-CM | POA: Diagnosis not present

## 2022-07-23 DIAGNOSIS — N23 Unspecified renal colic: Secondary | ICD-10-CM | POA: Diagnosis not present

## 2022-07-25 DIAGNOSIS — N200 Calculus of kidney: Secondary | ICD-10-CM | POA: Diagnosis not present

## 2022-08-08 DIAGNOSIS — N200 Calculus of kidney: Secondary | ICD-10-CM | POA: Diagnosis not present

## 2022-08-08 DIAGNOSIS — R1084 Generalized abdominal pain: Secondary | ICD-10-CM | POA: Diagnosis not present

## 2022-09-02 ENCOUNTER — Emergency Department (HOSPITAL_COMMUNITY): Payer: Medicaid Other

## 2022-09-02 ENCOUNTER — Encounter (HOSPITAL_COMMUNITY): Payer: Self-pay

## 2022-09-02 ENCOUNTER — Emergency Department (HOSPITAL_COMMUNITY)
Admission: EM | Admit: 2022-09-02 | Discharge: 2022-09-02 | Disposition: A | Discharge status: Home / Self Care | Payer: Medicaid Other | Attending: Emergency Medicine | Admitting: Emergency Medicine

## 2022-09-02 DIAGNOSIS — R0789 Other chest pain: Secondary | ICD-10-CM | POA: Diagnosis not present

## 2022-09-02 DIAGNOSIS — Y92410 Unspecified street and highway as the place of occurrence of the external cause: Secondary | ICD-10-CM | POA: Diagnosis not present

## 2022-09-02 DIAGNOSIS — S40021A Contusion of right upper arm, initial encounter: Secondary | ICD-10-CM | POA: Insufficient documentation

## 2022-09-02 DIAGNOSIS — R569 Unspecified convulsions: Secondary | ICD-10-CM | POA: Insufficient documentation

## 2022-09-02 DIAGNOSIS — R410 Disorientation, unspecified: Secondary | ICD-10-CM | POA: Diagnosis not present

## 2022-09-02 DIAGNOSIS — R1031 Right lower quadrant pain: Secondary | ICD-10-CM | POA: Diagnosis present

## 2022-09-02 DIAGNOSIS — R609 Edema, unspecified: Secondary | ICD-10-CM | POA: Diagnosis not present

## 2022-09-02 DIAGNOSIS — R918 Other nonspecific abnormal finding of lung field: Secondary | ICD-10-CM | POA: Diagnosis not present

## 2022-09-02 DIAGNOSIS — R519 Headache, unspecified: Secondary | ICD-10-CM | POA: Diagnosis not present

## 2022-09-02 DIAGNOSIS — M542 Cervicalgia: Secondary | ICD-10-CM | POA: Diagnosis not present

## 2022-09-02 DIAGNOSIS — I10 Essential (primary) hypertension: Secondary | ICD-10-CM | POA: Diagnosis not present

## 2022-09-02 DIAGNOSIS — S8012XA Contusion of left lower leg, initial encounter: Secondary | ICD-10-CM | POA: Insufficient documentation

## 2022-09-02 DIAGNOSIS — S8011XA Contusion of right lower leg, initial encounter: Secondary | ICD-10-CM | POA: Insufficient documentation

## 2022-09-02 DIAGNOSIS — S301XXA Contusion of abdominal wall, initial encounter: Secondary | ICD-10-CM | POA: Diagnosis not present

## 2022-09-02 DIAGNOSIS — M79603 Pain in arm, unspecified: Secondary | ICD-10-CM | POA: Diagnosis not present

## 2022-09-02 DIAGNOSIS — S40022A Contusion of left upper arm, initial encounter: Secondary | ICD-10-CM | POA: Insufficient documentation

## 2022-09-02 DIAGNOSIS — J9811 Atelectasis: Secondary | ICD-10-CM | POA: Diagnosis not present

## 2022-09-02 DIAGNOSIS — R079 Chest pain, unspecified: Secondary | ICD-10-CM | POA: Diagnosis not present

## 2022-09-02 LAB — COMPREHENSIVE METABOLIC PANEL
ALT: 14 U/L (ref 0–44)
AST: 19 U/L (ref 15–41)
Albumin: 3.8 g/dL (ref 3.5–5.0)
Alkaline Phosphatase: 34 U/L — ABNORMAL LOW (ref 38–126)
Anion gap: 12 (ref 5–15)
BUN: 12 mg/dL (ref 6–20)
CO2: 21 mmol/L — ABNORMAL LOW (ref 22–32)
Calcium: 8.6 mg/dL — ABNORMAL LOW (ref 8.9–10.3)
Chloride: 102 mmol/L (ref 98–111)
Creatinine, Ser: 0.78 mg/dL (ref 0.44–1.00)
GFR, Estimated: >60 mL/min (ref >60)
Glucose, Bld: 105 mg/dL — ABNORMAL HIGH (ref 70–99)
Potassium: 3.6 mmol/L (ref 3.5–5.1)
Sodium: 135 mmol/L (ref 135–145)
Total Bilirubin: 0.9 mg/dL (ref 0.3–1.2)
Total Protein: 6.8 g/dL (ref 6.5–8.1)

## 2022-09-02 LAB — CBC WITH DIFFERENTIAL/PLATELET
Abs Immature Granulocytes: 0.03 10*3/uL (ref 0.00–0.07)
Basophils Absolute: 0 10*3/uL (ref 0.0–0.1)
Basophils Relative: 0 %
Eosinophils Absolute: 0 10*3/uL (ref 0.0–0.5)
Eosinophils Relative: 1 %
HCT: 37.1 % (ref 36.0–46.0)
Hemoglobin: 11.7 g/dL — ABNORMAL LOW (ref 12.0–15.0)
Immature Granulocytes: 0 %
Lymphocytes Relative: 35 %
Lymphs Abs: 2.7 10*3/uL (ref 0.7–4.0)
MCH: 31.5 pg (ref 26.0–34.0)
MCHC: 31.5 g/dL (ref 30.0–36.0)
MCV: 100 fL (ref 80.0–100.0)
Monocytes Absolute: 0.5 10*3/uL (ref 0.1–1.0)
Monocytes Relative: 7 %
Neutro Abs: 4.4 10*3/uL (ref 1.7–7.7)
Neutrophils Relative %: 57 %
Platelets: 264 10*3/uL (ref 150–400)
RBC: 3.71 MIL/uL — ABNORMAL LOW (ref 3.87–5.11)
RDW: 12.8 % (ref 11.5–15.5)
WBC: 7.7 10*3/uL (ref 4.0–10.5)
nRBC: 0 % (ref 0.0–0.2)

## 2022-09-02 LAB — I-STAT CHEM 8, ED
BUN: 14 mg/dL (ref 6–20)
Calcium, Ion: 1.09 mmol/L — ABNORMAL LOW (ref 1.15–1.40)
Chloride: 105 mmol/L (ref 98–111)
Creatinine, Ser: 0.7 mg/dL (ref 0.44–1.00)
Glucose, Bld: 100 mg/dL — ABNORMAL HIGH (ref 70–99)
HCT: 37 % (ref 36.0–46.0)
Hemoglobin: 12.6 g/dL (ref 12.0–15.0)
Potassium: 3.6 mmol/L (ref 3.5–5.1)
Sodium: 137 mmol/L (ref 135–145)
TCO2: 22 mmol/L (ref 22–32)

## 2022-09-02 LAB — I-STAT BETA HCG BLOOD, ED (MC, WL, AP ONLY): I-stat hCG, quantitative: <5 m[IU]/mL (ref <5)

## 2022-09-02 MED ORDER — METHOCARBAMOL 500 MG PO TABS: 500.0000 mg | ORAL_TABLET | Freq: Four times a day (QID) | ORAL | 0 refills | Status: DC | PRN

## 2022-09-02 MED ORDER — IOHEXOL 350 MG/ML SOLN
75.0000 mL | Freq: Once | INTRAVENOUS | Status: AC | PRN
  Administered 2022-09-02: 75 mL via INTRAVENOUS

## 2022-09-02 MED ORDER — PROMETHAZINE HCL 25 MG PO TABS
25.0000 mg | ORAL_TABLET | Freq: Once | ORAL | Status: AC
  Administered 2022-09-02: 25 mg via ORAL
  Filled 2022-09-02: qty 1

## 2022-09-02 MED ORDER — METHOCARBAMOL 500 MG PO TABS
1000.0000 mg | ORAL_TABLET | Freq: Once | ORAL | Status: AC
  Administered 2022-09-02: 1000 mg via ORAL
  Filled 2022-09-02: qty 2

## 2022-09-02 MED ORDER — ACETAMINOPHEN 500 MG PO TABS
1000.0000 mg | ORAL_TABLET | Freq: Once | ORAL | Status: AC
  Administered 2022-09-02: 1000 mg via ORAL
  Filled 2022-09-02: qty 2

## 2022-09-02 MED ORDER — KETOROLAC TROMETHAMINE 15 MG/ML IJ SOLN
15.0000 mg | Freq: Once | INTRAMUSCULAR | Status: AC
  Administered 2022-09-02: 15 mg via INTRAVENOUS
  Filled 2022-09-02: qty 1

## 2022-09-02 MED ORDER — ONDANSETRON HCL 4 MG/2ML IJ SOLN
4.0000 mg | Freq: Once | INTRAMUSCULAR | Status: AC
  Administered 2022-09-02: 4 mg via INTRAVENOUS
  Filled 2022-09-02: qty 2

## 2022-09-02 MED ORDER — MORPHINE SULFATE (PF) 4 MG/ML IV SOLN
4.0000 mg | Freq: Once | INTRAVENOUS | Status: AC
  Administered 2022-09-02: 4 mg via INTRAVENOUS
  Filled 2022-09-02: qty 1

## 2022-09-02 NOTE — ED Notes (Signed)
Pt ambulated in hallway with steady gait.

## 2022-09-02 NOTE — Discharge Instructions (Signed)
Take ibuprofen 3 times a day with meals.  Do not take other anti-inflammatories at the same time (Advil, Motrin, naproxen, Aleve). You may supplement with Tylenol if you need further pain control. Use robaxin as needed for muscle stiffness or soreness.  Have caution, this may make you tired or groggy.  Do not drive or operate heavy machinery while taking this medicine. Use ice packs or heating pads if this helps control your pain. You will likely have continued muscle stiffness and soreness over the next couple days.  Follow-up with primary care in 1 week if your symptoms are not improving. Return to the emergency room if you develop vision changes, vomiting, slurred speech, numbness, loss of bowel or bladder control, or any new or worsening symptoms.  Follow up with your neurologist regarding your seizure YOU MAY NOT DRIVE OR OPERATE HEAVY MACHINERY FOR A MINIMUM OF 6 MONTHS AFTER YOUR LAST SEIZURE. Your neurologist will clear you to return to driving.
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
## 2022-09-02 NOTE — ED Notes (Signed)
Pt transported to CT ?

## 2022-09-03 ENCOUNTER — Telehealth: Payer: Self-pay

## 2022-09-03 NOTE — Transitions of Care (Post Inpatient/ED Visit) (Signed)
   09/03/2022  Name: KHILEY LIESER MRN: [id number] DOB: 11-21-1990  Today's TOC FU Call Status: Today's TOC FU Call Status:: Unsuccessul Call (1st Attempt) Unsuccessful Call (1st Attempt) Date: 09/03/22  Attempted to reach the patient regarding the most recent Inpatient/ED visit.  Follow Up Plan: Additional outreach attempts will be made to reach the patient to complete the Transitions of Care (Post Inpatient/ED visit) call.   Abelino Derrick, MHA [facility]  Managed [facility] Social Worker [phone]

## 2022-09-05 ENCOUNTER — Telehealth: Payer: Self-pay

## 2022-09-05 NOTE — Transitions of Care (Post Inpatient/ED Visit) (Signed)
   09/05/2022  Name: Marie Brown MRN: [id number] DOB: 01-18-1991  Today's TOC FU Call Status: Today's TOC FU Call Status:: Successful TOC FU Call Competed TOC FU Call Complete Date: 09/05/22  Transition Care Management Follow-up Telephone Call How have you been since you were released from the hospital?: Better (very sore and in a lot of pain) Any questions or concerns?: No  Items Reviewed: Did you receive and understand the discharge instructions provided?: Yes Medications obtained and verified?: Yes (Medications Reviewed) Any new allergies since your discharge?: No Dietary orders reviewed?: No Do you have support at home?: Yes People in Home: significant other, parent(s) Name of Support/Comfort Primary Source: mom and girlfriend  Home Care and Equipment/Supplies: Were Home Health Services Ordered?: No  Functional Questionnaire: Do you need assistance with bathing/showering or dressing?: No Do you need assistance with meal preparation?: No Do you need assistance with eating?: No Do you have difficulty maintaining continence: No Do you have difficulty managing or taking your medications?: No  Follow up appointments reviewed: PCP Follow-up appointment confirmed?: No MD Provider Line Number:[phone] Given: No Specialist Hospital Follow-up appointment confirmed?: Yes Date of Specialist follow-up appointment?: 09/17/22 Follow-Up Specialty Provider:: Neurologist Do you need transportation to your follow-up appointment?: No Do you understand care options if your condition(s) worsen?: Yes-patient verbalized understanding           Gus Puma, Kenard Gower, [facility]  Managed [facility] Social Worker [phone]

## 2022-09-17 DIAGNOSIS — R569 Unspecified convulsions: Secondary | ICD-10-CM | POA: Diagnosis not present

## 2022-09-17 DIAGNOSIS — G43109 Migraine with aura, not intractable, without status migrainosus: Secondary | ICD-10-CM | POA: Diagnosis not present

## 2022-09-23 DIAGNOSIS — B009 Herpesviral infection, unspecified: Secondary | ICD-10-CM | POA: Diagnosis not present

## 2022-09-23 DIAGNOSIS — M7918 Myalgia, other site: Secondary | ICD-10-CM | POA: Diagnosis not present

## 2022-09-23 DIAGNOSIS — G43109 Migraine with aura, not intractable, without status migrainosus: Secondary | ICD-10-CM | POA: Diagnosis not present

## 2022-10-17 DIAGNOSIS — R200 Anesthesia of skin: Secondary | ICD-10-CM | POA: Diagnosis not present

## 2022-10-17 DIAGNOSIS — R202 Paresthesia of skin: Secondary | ICD-10-CM | POA: Diagnosis not present

## 2022-10-17 DIAGNOSIS — D1802 Hemangioma of intracranial structures: Secondary | ICD-10-CM | POA: Diagnosis not present

## 2022-10-17 DIAGNOSIS — R569 Unspecified convulsions: Secondary | ICD-10-CM | POA: Diagnosis not present

## 2022-11-04 DIAGNOSIS — R55 Syncope and collapse: Secondary | ICD-10-CM | POA: Diagnosis not present

## 2022-11-23 ENCOUNTER — Inpatient Hospital Stay (HOSPITAL_COMMUNITY)
Admission: EM | Admit: 2022-11-23 | Discharge: 2022-11-25 | DRG: 880 | Disposition: A | Discharge status: Home / Self Care | Payer: 59 | Attending: Internal Medicine | Admitting: Internal Medicine

## 2022-11-23 ENCOUNTER — Other Ambulatory Visit: Payer: Self-pay

## 2022-11-23 DIAGNOSIS — R4182 Altered mental status, unspecified: Secondary | ICD-10-CM | POA: Diagnosis not present

## 2022-11-23 DIAGNOSIS — Z803 Family history of malignant neoplasm of breast: Secondary | ICD-10-CM

## 2022-11-23 DIAGNOSIS — Z833 Family history of diabetes mellitus: Secondary | ICD-10-CM

## 2022-11-23 DIAGNOSIS — F445 Conversion disorder with seizures or convulsions: Secondary | ICD-10-CM | POA: Diagnosis not present

## 2022-11-23 DIAGNOSIS — Z8249 Family history of ischemic heart disease and other diseases of the circulatory system: Secondary | ICD-10-CM

## 2022-11-23 DIAGNOSIS — R404 Transient alteration of awareness: Secondary | ICD-10-CM | POA: Diagnosis present

## 2022-11-23 DIAGNOSIS — Z87442 Personal history of urinary calculi: Secondary | ICD-10-CM

## 2022-11-23 DIAGNOSIS — F39 Unspecified mood [affective] disorder: Secondary | ICD-10-CM | POA: Diagnosis present

## 2022-11-23 DIAGNOSIS — G40909 Epilepsy, unspecified, not intractable, without status epilepticus: Secondary | ICD-10-CM | POA: Diagnosis not present

## 2022-11-23 DIAGNOSIS — Z823 Family history of stroke: Secondary | ICD-10-CM

## 2022-11-23 DIAGNOSIS — R4689 Other symptoms and signs involving appearance and behavior: Secondary | ICD-10-CM | POA: Diagnosis not present

## 2022-11-23 DIAGNOSIS — Z79899 Other long term (current) drug therapy: Secondary | ICD-10-CM

## 2022-11-23 DIAGNOSIS — R55 Syncope and collapse: Secondary | ICD-10-CM | POA: Diagnosis present

## 2022-11-23 DIAGNOSIS — R253 Fasciculation: Secondary | ICD-10-CM | POA: Diagnosis present

## 2022-11-23 DIAGNOSIS — I10 Essential (primary) hypertension: Secondary | ICD-10-CM | POA: Diagnosis not present

## 2022-11-23 DIAGNOSIS — Z820 Family history of epilepsy and other diseases of the nervous system: Secondary | ICD-10-CM

## 2022-11-23 DIAGNOSIS — G43909 Migraine, unspecified, not intractable, without status migrainosus: Secondary | ICD-10-CM | POA: Diagnosis present

## 2022-11-23 DIAGNOSIS — R569 Unspecified convulsions: Secondary | ICD-10-CM | POA: Diagnosis not present

## 2022-11-23 DIAGNOSIS — Z9049 Acquired absence of other specified parts of digestive tract: Secondary | ICD-10-CM

## 2022-11-23 LAB — URINALYSIS, ROUTINE W REFLEX MICROSCOPIC
Bilirubin Urine: NEGATIVE
Glucose, UA: NEGATIVE mg/dL
Hgb urine dipstick: NEGATIVE
Ketones, ur: 5 mg/dL — AB
Leukocytes,Ua: NEGATIVE
Nitrite: NEGATIVE
Protein, ur: NEGATIVE mg/dL
Specific Gravity, Urine: 1.009 (ref 1.005–1.030)
pH: 8 (ref 5.0–8.0)

## 2022-11-23 LAB — COMPREHENSIVE METABOLIC PANEL
ALT: 23 U/L (ref 0–44)
AST: 23 U/L (ref 15–41)
Albumin: 4.2 g/dL (ref 3.5–5.0)
Alkaline Phosphatase: 41 U/L (ref 38–126)
Anion gap: 8 (ref 5–15)
BUN: 8 mg/dL (ref 6–20)
CO2: 18 mmol/L — ABNORMAL LOW (ref 22–32)
Calcium: 9.1 mg/dL (ref 8.9–10.3)
Chloride: 112 mmol/L — ABNORMAL HIGH (ref 98–111)
Creatinine, Ser: 0.89 mg/dL (ref 0.44–1.00)
GFR, Estimated: >60 mL/min (ref >60)
Glucose, Bld: 99 mg/dL (ref 70–99)
Potassium: 3.6 mmol/L (ref 3.5–5.1)
Sodium: 138 mmol/L (ref 135–145)
Total Bilirubin: 0.7 mg/dL (ref 0.3–1.2)
Total Protein: 7.2 g/dL (ref 6.5–8.1)

## 2022-11-23 LAB — CBC WITH DIFFERENTIAL/PLATELET
Abs Immature Granulocytes: 0.02 K/uL (ref 0.00–0.07)
Basophils Absolute: 0 K/uL (ref 0.0–0.1)
Basophils Relative: 0 %
Eosinophils Absolute: 0 K/uL (ref 0.0–0.5)
Eosinophils Relative: 0 %
HCT: 39.2 % (ref 36.0–46.0)
Hemoglobin: 12.6 g/dL (ref 12.0–15.0)
Immature Granulocytes: 0 %
Lymphocytes Relative: 13 %
Lymphs Abs: 0.9 K/uL (ref 0.7–4.0)
MCH: 32.6 pg (ref 26.0–34.0)
MCHC: 32.1 g/dL (ref 30.0–36.0)
MCV: 101.3 fL — ABNORMAL HIGH (ref 80.0–100.0)
Monocytes Absolute: 0.5 K/uL (ref 0.1–1.0)
Monocytes Relative: 8 %
Neutro Abs: 5.5 K/uL (ref 1.7–7.7)
Neutrophils Relative %: 79 %
Platelets: 248 K/uL (ref 150–400)
RBC: 3.87 MIL/uL (ref 3.87–5.11)
RDW: 13.2 % (ref 11.5–15.5)
WBC: 7 K/uL (ref 4.0–10.5)
nRBC: 0 % (ref 0.0–0.2)

## 2022-11-23 LAB — RAPID URINE DRUG SCREEN, HOSP PERFORMED
Amphetamines: NOT DETECTED
Barbiturates: NOT DETECTED
Benzodiazepines: NOT DETECTED
Cocaine: NOT DETECTED
Opiates: NOT DETECTED
Tetrahydrocannabinol: POSITIVE — AB

## 2022-11-23 LAB — HCG, SERUM, QUALITATIVE: Preg, Serum: NEGATIVE

## 2022-11-23 MED ORDER — LEVETIRACETAM ER 500 MG PO TB24
1000.0000 mg | ORAL_TABLET | Freq: Every day | ORAL | Status: DC
  Administered 2022-11-23: 1000 mg via ORAL
  Filled 2022-11-23 (×2): qty 2

## 2022-11-23 MED ORDER — ONDANSETRON HCL 4 MG/2ML IJ SOLN: 4.0000 mg | Freq: Four times a day (QID) | INTRAMUSCULAR | Status: DC | PRN

## 2022-11-23 MED ORDER — LORAZEPAM 2 MG/ML IJ SOLN
2.0000 mg | Freq: Once | INTRAMUSCULAR | Status: AC
  Administered 2022-11-23: 2 mg via INTRAVENOUS
  Filled 2022-11-23: qty 1

## 2022-11-23 MED ORDER — LEVETIRACETAM IN NACL 1000 MG/100ML IV SOLN
1000.0000 mg | Freq: Once | INTRAVENOUS | Status: AC
  Administered 2022-11-23: 1000 mg via INTRAVENOUS
  Filled 2022-11-23: qty 100

## 2022-11-23 MED ORDER — ACETAMINOPHEN 650 MG RE SUPP: 650.0000 mg | RECTAL | Status: DC | PRN

## 2022-11-23 MED ORDER — TOPIRAMATE 25 MG PO TABS
100.0000 mg | ORAL_TABLET | Freq: Every day | ORAL | Status: DC
  Administered 2022-11-23: 100 mg via ORAL
  Filled 2022-11-23: qty 4

## 2022-11-23 MED ORDER — LORAZEPAM 2 MG/ML IJ SOLN: 2.0000 mg | INTRAMUSCULAR | Status: DC | PRN

## 2022-11-23 MED ORDER — SODIUM CHLORIDE 0.9 % IV BOLUS
1000.0000 mL | Freq: Once | INTRAVENOUS | Status: AC
  Administered 2022-11-23: 1000 mL via INTRAVENOUS

## 2022-11-23 MED ORDER — SENNOSIDES-DOCUSATE SODIUM 8.6-50 MG PO TABS: 1.0000 | ORAL_TABLET | Freq: Every evening | ORAL | Status: DC | PRN

## 2022-11-23 MED ORDER — ONDANSETRON HCL 4 MG PO TABS: 4.0000 mg | ORAL_TABLET | Freq: Four times a day (QID) | ORAL | Status: DC | PRN

## 2022-11-23 MED ORDER — ENOXAPARIN SODIUM 40 MG/0.4ML IJ SOSY
40.0000 mg | PREFILLED_SYRINGE | INTRAMUSCULAR | Status: DC
  Administered 2022-11-23 – 2022-11-24 (×2): 40 mg via SUBCUTANEOUS
  Filled 2022-11-23 (×2): qty 0.4

## 2022-11-23 MED ORDER — ACETAMINOPHEN 325 MG PO TABS
650.0000 mg | ORAL_TABLET | ORAL | Status: DC | PRN
  Administered 2022-11-24 – 2022-11-25 (×3): 650 mg via ORAL
  Filled 2022-11-23 (×3): qty 2

## 2022-11-23 MED ORDER — SODIUM CHLORIDE 0.9 % IV SOLN: INTRAVENOUS | Status: DC

## 2022-11-23 NOTE — ED Notes (Addendum)
Pt has had a couple of episodes where she seemed to space out with plantar flexion during triage and family discussion

## 2022-11-23 NOTE — ED Notes (Signed)
Multiple calls made to EEG with no answer for ordered EEG.

## 2022-11-23 NOTE — H&P (Signed)
History and Physical    KANDE DILLAHUNT ZOX:[id number] DOB: 09-19-1990 DOA: 11/23/2022  PCP: Dan Maker, MD  Patient coming from: Home  I have personally briefly reviewed patient's old medical records in [facility]  Chief Complaint: Seizure-like activity  HPI: Marie Brown is a 32 y.o. 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  History is limited from patient as she does not recall the event of the day and is otherwise supplemented by EDP, chart review, and mother at bedside.  Around 940 this morning patient's girlfriend reported that patient developed seizure-like activity.  This was described as patient's arms constricted up with her hands and a grabbing/squeezing motion.  Legs were kicking violently.  Eyes were open but patient was not responding for approximately 15 minutes.  After this episode patient was "out of it" and not responsive or communicative for hours.  She was able to walk to the restroom where she ended up vomiting.  Around 11:30 AM patient's mother was attempting to help her get dressed when patient's right leg stiffened up in a raised straight position and was unable to be pushed down.  Her mother noted that patient developed contracting appearance of both of her arms.  EMS was subsequently called and patient was brought to the ED for further evaluation.  Patient states that she has been taking all of her medications regularly including Keppra.  ED Course  Labs/Imaging on admission: I have personally reviewed following labs and imaging studies.  Initial vitals showed BP 147/74, pulse 72, RR 16, temp 97.6 F, SpO2 99% on room air.  Labs show sodium 138, potassium 3.6, bicarb 18, BUN 8, creatinine 0.89, serum glucose 99, LFTs within normal limits, WBC 7.0, hemoglobin 12.6, platelets 248,000.  Serum  hCG negative.  Keppra level in process.  Patient was given 1 L normal saline, IV Keppra 1000 mg, IV Ativan 2 mg.  Neurology were consulted and recommended admission for overnight EEG.  The hospitalist service was consulted to admit for further evaluation and management.  Review of Systems: All systems reviewed and are negative except as documented in history of present illness above.   Past Medical History:  Diagnosis Date   Bipolar 2 disorder (HCC)    Carpal tunnel syndrome on both sides    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 ;   last MRI in care everywhere 08-25-2021   History of acute pyelonephritis 03/28/2022   admission in epic w/ UTI due to obstructive uropathy due to kidney stone   History of kidney stones    History of syncope    (04-30-2022  pt stated last episode 03-27-2022  in setting kidney stone pain )   syncope w/ collapse with recurrent near syncope/ syncope ;   cardiologsit --- dr Sherlyn Lees note 11-11-2021 work-up results in care everywhere event monitor showed benign w/ low burden PACs/ PVCs & evidence inappropriate ST;  normal ETT and echo   IDA (iron deficiency anemia)    Left ureteral calculus    Migraine    Palpitations    (04-30-2022  pt stated taking toprol daily has helped)   pt is scheduled for loop recorder insertion 05-08-2022 @ [facility]   Seizure-like activity (HCC)    (04-30-2022  pt stated last syncope/ seizure like activity 03-27-2022 in setting kidney stone pain)   neurologist-- dr Bea Laura. Charyl Dancer;   started  04/ 2023 with syncope w/ collapse had seizure like acitivity;   MRI  left frontal cerebral cavenoma in 2011 and last MRI in care everywhere 08-25-2021 same;   EEG 04-16-2022 care everywhere normal without seizure acitivy   Urgency of urination    Wears glasses     Past Surgical History:  Procedure Laterality Date   CYSTOSCOPY W/ URETERAL STENT PLACEMENT Left 03/28/2022   Procedure: CYSTOSCOPY WITH RETROGRADE PYELOGRAM/URETERAL STENT PLACEMENT;  Surgeon:  Jannifer Hick, MD;  Location: [facility];  Service: Urology;  Laterality: Left;   CYSTOSCOPY/URETEROSCOPY/HOLMIUM LASER/STENT PLACEMENT Left 05/02/2022   Procedure: CYSTOSCOPY/LEFT URETEROSCOPY/HOLMIUM LASER/LEFT RETROGRADE PYELOGRAM/LEFT STENT PLACEMENT;  Surgeon: Jannifer Hick, MD;  Location: [facility];  Service: Urology;  Laterality: Left;  60 MINUTES NEEDED FOR CASE.   FOOT SURGERY Bilateral 2005   bunion   LAPAROSCOPIC APPENDECTOMY  12/20/2009   @[facility]  by dr b. zeigler    Social History:  reports that she has never smoked. She has never used smokeless tobacco. She reports that she does not currently use alcohol. She reports that she does not use drugs.  No Known Allergies  Family History  Problem Relation Age of Onset   Hypertension Mother    Diabetes Other    Thyroid disease Other    Heart disease Other    Birth defects Other    Mental illness Other    Stroke Other    Breast cancer Other    Graves' disease Maternal Grandmother    Multiple sclerosis Maternal Grandfather    Suicidality Father      Prior to Admission medications   Medication Sig Start Date End Date Taking? Authorizing Provider  levETIRAcetam (KEPPRA XR) 500 MG 24 hr tablet Take 500 mg by mouth at bedtime.    [provider]  methocarbamol (ROBAXIN) 500 MG tablet Take 1 tablet (500 mg total) by mouth every 6 (six) hours as needed for muscle spasms. 09/02/22   Caccavale, Sophia, PA-C  metoprolol succinate (TOPROL-XL) 25 MG 24 hr tablet Take 25 mg by mouth daily. 12/31/21   [provider]  ondansetron (ZOFRAN-ODT) 4 MG disintegrating tablet Take 1 tablet (4 mg total) by mouth every 8 (eight) hours as needed. Patient not taking: Reported on 09/02/2022 02/02/22   Ernie Avena, MD  ondansetron (ZOFRAN-ODT) 8 MG disintegrating tablet Take 1 tablet (8 mg total) by mouth every 8 (eight) hours. Patient not taking: Reported on 09/02/2022 07/21/22     oxybutynin (DITROPAN-XL) 5 MG 24 hr tablet  Take 5 mg by mouth daily. 04/24/22   [provider]  oxyCODONE-acetaminophen (PERCOCET) 5-325 MG tablet Take 1 tablet by mouth every 4 (four) hours as needed for up to 18 doses for severe pain. Patient not taking: Reported on 09/02/2022 05/02/22   Jannifer Hick, MD  tamsulosin (FLOMAX) 0.4 MG CAPS capsule Take 0.4 mg by mouth daily.    [provider]  tiZANidine (ZANAFLEX) 2 MG tablet Take 2 mg by mouth every 6 (six) hours as needed for muscle spasms. 03/05/22   [provider]  topiramate (TOPAMAX) 25 MG tablet Take 25 mg by mouth daily. 02/05/22   [provider]  valACYclovir (VALTREX) 1000 MG tablet Take 1,000 mg by mouth daily. 03/11/22   [provider]    Physical Exam: Vitals:   11/23/22 1600 11/23/22 1645 11/23/22 1808 11/23/22 1815  BP: 108/62 113/69 132/73 130/84  Pulse: 62 90  65  Resp: 14 (!) 25 15 12   Temp:  97.9 F (36.6 C)   TempSrc:   Oral   SpO2: 100% 99% 99% 100%   Constitutional: Resting in bed, NAD, calm, comfortable Eyes: PERRL, lids and conjunctivae normal ENMT: Mucous membranes are moist. Posterior pharynx clear of any exudate or lesions.Normal dentition.  Neck: normal, supple, no masses. Respiratory: clear to auscultation bilaterally, no wheezing, no crackles. Normal respiratory effort. No accessory muscle use.  Cardiovascular: Regular rate and rhythm, no murmurs / rubs / gallops. No extremity edema. 2+ pedal pulses. Abdomen: no tenderness, no masses palpated.  Musculoskeletal: no clubbing / cyanosis. No joint deformity upper and lower extremities. Good ROM, no contractures. Normal muscle tone.  Skin: no rashes, lesions, ulcers. No induration Neurologic: Sensation intact. Strength 5/5 in all 4.  Psychiatric: Alert and oriented x 3.  Labile mood.   EKG: Not performed.  Assessment/Plan Principal Problem:   Seizure-like activity (HCC)   Tecia ZAHAIRA LOWING is a 32 y.o. 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  With resolution of seizure-like episode. -Neurology following -Overnight EEG ordered -Continue home Keppra 1000 mg daily and topiramate 100 mg daily -Seizure precautions -Follow UA and UDS -Follow Keppra level  Left frontal cerebral cavernoma: Follows with [facility] neurology.  Stable 1.6 cm cavernoma in the medial left frontal lobe noted on MRI head 10/17/2022.  Migraine headaches: Continue topiramate.   DVT prophylaxis: enoxaparin (LOVENOX) injection 40 mg Start: 11/23/22 1945 Code Status: Full code Family Communication: Mother at bedside Disposition Plan: From home and likely discharge to home pending clinical progress Consults called: Neurology Severity of Illness: The appropriate patient status for this patient is OBSERVATION. Observation status is judged to be reasonable and necessary in order to provide the required intensity of service to ensure the patient's safety. The patient's presenting symptoms, physical exam findings, and initial radiographic and laboratory data in the context of their medical condition is felt to place them at decreased risk for further clinical deterioration. Furthermore, it is anticipated that the patient will be medically stable for discharge from the hospital within 2 midnights of admission.   Darreld Mclean MD [facility]  If 7PM-7AM, please contact night-coverage [email or web address]  11/23/2022, 7:49 PM
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
## 2022-11-23 NOTE — ED Notes (Signed)
Pt just woke up and is tearful. Family at bedside.

## 2022-11-23 NOTE — Hospital Course (Signed)
Marie Brown is a 32 y.o. female with medical history significant for left frontal cerebral cavernoma with syncope/seizure like activity, migraines, mood disorder who is admitted for evaluation of seizure-like activity.

## 2022-11-23 NOTE — ED Provider Notes (Signed)
Care of patient received from prior provider at 3:17 PM, please see their note for complete H/P and care plan.  Received handoff per ED course.  Clinical Course as of 11/23/22 2233  Wynelle Link Nov 23, 2022  1428 CBC normal.  Metabolic panel normal except bicarb of 18 [JK]  1502 Pregnancy test negative [JK]  1503 Patient sedated, no seizure activity noted on my exam [JK]  1505 Left message with Dr. Selina Cooley neurology for consultation.   [JK]  1515 Stable HO from JK 31 with sz like activity.  Petite mal/Staring episodes.  Intermittently able to answer questions. Consult with Neurology. Multiple episodes recently. On meds at baseline for epilepsy. On Levitiracetam and Topimax.  Neurology to see. Not back to baseline. [CC]  1638 Neuro to see [CC]  1737 Neuro recs not signed but appear to recommend admission [CC]    Clinical Course User Index [CC] Glyn Ade, MD [JK] Linwood Dibbles, MD    Reassessment: Neurology evaluated at bedside.  They recommended admission with hospitalist for further care and management.  CRITICAL CARE Performed by: Glyn Ade  ?  Total critical care time: 30 minutes for treatment of status epilepticus  Critical care time was exclusive of separately billable procedures and treating other patients.  Critical care was necessary to treat or prevent imminent or life-threatening deterioration.  Critical care was time spent personally by me on the following activities: development of treatment plan with patient and/or surrogate as well as nursing, discussions with consultants, evaluation of patient's response to treatment, examination of patient, obtaining history from patient or surrogate, ordering and performing treatments and interventions, ordering and review of laboratory studies, ordering and review of radiographic studies, pulse oximetry and re-evaluation of patient's condition.  Disposition:   Based on the above findings, I believe this patient is  stable for admission.    Patient/family educated about specific findings on our evaluation and explained exact reasons for admission.  Patient/family educated about clinical situation and time was allowed to answer questions.   Admission team communicated with and agreed with need for admission. Patient admitted. Patient ready to move at this time.     Emergency Department Medication Summary:   Medications  sodium chloride 0.9 % bolus 1,000 mL (0 mLs Intravenous Stopped 11/23/22 1500)    And  0.9 %  sodium chloride infusion ( Intravenous New Bag/Given 11/23/22 1812)  enoxaparin (LOVENOX) injection 40 mg (40 mg Subcutaneous Given 11/23/22 2005)  LORazepam (ATIVAN) injection 2 mg (has no administration in time range)  acetaminophen (TYLENOL) tablet 650 mg (has no administration in time range)    Or  acetaminophen (TYLENOL) suppository 650 mg (has no administration in time range)  senna-docusate (Senokot-S) tablet 1 tablet (has no administration in time range)  ondansetron (ZOFRAN) tablet 4 mg (has no administration in time range)    Or  ondansetron (ZOFRAN) injection 4 mg (has no administration in time range)  levETIRAcetam (KEPPRA XR) 24 hr tablet 1,000 mg (1,000 mg Oral Given 11/23/22 2157)  topiramate (TOPAMAX) tablet 100 mg (100 mg Oral Given 11/23/22 2155)  LORazepam (ATIVAN) injection 2 mg (2 mg Intravenous Given 11/23/22 1320)  levETIRAcetam (KEPPRA) IVPB 1000 mg/100 mL premix (0 mg Intravenous Stopped 11/23/22 1358)            Glyn Ade, MD 11/23/22 2234

## 2022-11-23 NOTE — ED Provider Notes (Signed)
[place] EMERGENCY DEPARTMENT AT [facility] Provider Note   CSN: [id number] Arrival date & time: 11/23/22  1221     History  No chief complaint on file.   Marie Brown is a 32 y.o. female.  HPI   Patient has a history of bipolar disorder, migraines, seizure-like activity, pyelonephritis, palpitations, cerebral cavinoma who presents to the ED for evaluation of seizure-like activity.  Family states patient started having seizure-like activity this morning since 9 AM.  Patient will have intermittent twitching.  She has been staring off.  She is not completely losing consciousness.  Patient does have a neurologist but has not seen them recently.  She has been continuing her medications.  No this doses of medications.  No recent fevers or chills.  No new medications  Home Medications Prior to Admission medications   Medication Sig Start Date End Date Taking? Authorizing Provider  levETIRAcetam (KEPPRA XR) 500 MG 24 hr tablet Take 500 mg by mouth at bedtime.    [provider]  methocarbamol (ROBAXIN) 500 MG tablet Take 1 tablet (500 mg total) by mouth every 6 (six) hours as needed for muscle spasms. 09/02/22   Caccavale, Sophia, PA-C  metoprolol succinate (TOPROL-XL) 25 MG 24 hr tablet Take 25 mg by mouth daily. 12/31/21   [provider]  ondansetron (ZOFRAN-ODT) 4 MG disintegrating tablet Take 1 tablet (4 mg total) by mouth every 8 (eight) hours as needed. Patient not taking: Reported on 09/02/2022 02/02/22   Ernie Avena, MD  ondansetron (ZOFRAN-ODT) 8 MG disintegrating tablet Take 1 tablet (8 mg total) by mouth every 8 (eight) hours. Patient not taking: Reported on 09/02/2022 07/21/22     oxybutynin (DITROPAN-XL) 5 MG 24 hr tablet Take 5 mg by mouth daily. 04/24/22   [provider]  oxyCODONE-acetaminophen (PERCOCET) 5-325 MG tablet Take 1 tablet by mouth every 4 (four) hours as needed for up to 18 doses for severe pain. Patient not taking: Reported  on 09/02/2022 05/02/22   Jannifer Hick, MD  tamsulosin (FLOMAX) 0.4 MG CAPS capsule Take 0.4 mg by mouth daily.    [provider]  tiZANidine (ZANAFLEX) 2 MG tablet Take 2 mg by mouth every 6 (six) hours as needed for muscle spasms. 03/05/22   [provider]  topiramate (TOPAMAX) 25 MG tablet Take 25 mg by mouth daily. 02/05/22   [provider]  valACYclovir (VALTREX) 1000 MG tablet Take 1,000 mg by mouth daily. 03/11/22   [provider]      Allergies    Patient has no known allergies.    Review of Systems   Review of Systems  Physical Exam Updated Vital Signs BP 118/64   Pulse 60   Temp 97.6 F (36.4 C) (Oral)   Resp 13   SpO2 100%  Physical Exam Vitals and nursing note reviewed.  Constitutional:      Appearance: She is well-developed.  HENT:     Head: Normocephalic and atraumatic.     Right Ear: External ear normal.     Left Ear: External ear normal.  Eyes:     General: No scleral icterus.       Right eye: No discharge.        Left eye: No discharge.     Conjunctiva/sclera: Conjunctivae normal.  Neck:     Trachea: No tracheal deviation.  Cardiovascular:     Rate and Rhythm: Normal rate and regular rhythm.  Pulmonary:     Effort: Pulmonary effort  is normal. No respiratory distress.     Breath sounds: Normal breath sounds. No stridor. No wheezing or rales.  Abdominal:     General: Bowel sounds are normal. There is no distension.     Palpations: Abdomen is soft.     Tenderness: There is no abdominal tenderness. There is no guarding or rebound.  Musculoskeletal:        General: No tenderness or deformity.     Cervical back: Neck supple.  Skin:    General: Skin is warm and dry.     Findings: No rash.  Neurological:     General: No focal deficit present.     Cranial Nerves: No cranial nerve deficit, dysarthria or facial asymmetry.     Sensory: No sensory deficit.     Motor: No abnormal muscle tone or seizure activity.      Coordination: Coordination normal.     Comments: Patient is staring at me but is answering questions, following commands, slow to respond  Psychiatric:        Mood and Affect: Mood normal. Affect is flat.     ED Results / Procedures / Treatments   Labs (all labs ordered are listed, but only abnormal results are displayed) Labs Reviewed  COMPREHENSIVE METABOLIC PANEL - Abnormal; Notable for the following components:      Result Value   Chloride 112 (*)    CO2 18 (*)    All other components within normal limits  CBC WITH DIFFERENTIAL/PLATELET - Abnormal; Notable for the following components:   MCV 101.3 (*)    All other components within normal limits  HCG, SERUM, QUALITATIVE  LEVETIRACETAM LEVEL  CBG MONITORING, ED    EKG None  Radiology No results found.  Procedures Procedures    Medications Ordered in ED Medications  sodium chloride 0.9 % bolus 1,000 mL (1,000 mLs Intravenous New Bag/Given 11/23/22 1319)    And  0.9 %  sodium chloride infusion (has no administration in time range)  LORazepam (ATIVAN) injection 2 mg (2 mg Intravenous Given 11/23/22 1320)  levETIRAcetam (KEPPRA) IVPB 1000 mg/100 mL premix (0 mg Intravenous Stopped 11/23/22 1358)    ED Course/ Medical Decision Making/ A&P Clinical Course as of 11/23/22 1555  Sun Nov 23, 2022  1428 CBC normal.  Metabolic panel normal except bicarb of 18 [JK]  1502 Pregnancy test negative [JK]  1503 Patient sedated, no seizure activity noted on my exam [JK]  1505 Left message with Dr. Selina Cooley neurology for consultation.   [JK]  1515 Stable HO from JK 31YOM with sz like activity.  Petite mal/Staring episodes.  Intermittently able to answer questions. Consult with Neurology. Multiple episodes recently. On meds at baseline for epilepsy. On Levitiracetam and Topimax.  Neurology to see. Not back to baseline. [CC]    Clinical Course User Index [CC] Glyn Ade, MD [JK] Linwood Dibbles, MD                              Medical Decision Making Problems Addressed: Seizure disorder [facility]): acute illness or injury that poses a threat to life or bodily functions  Amount and/or Complexity of Data Reviewed Labs: ordered. Decision-making details documented in ED Course. Discussion of management or test interpretation with external provider(s): Neurology  Risk Prescription drug management. Decision regarding hospitalization.   Patient presented to ED for evaluation of recurrent seizures.  Patient does have history of known seizure disorder.  In the ED no  evidence of tonic-clonic activity but there has been episodes where the patient has been less responsive and she has had some flexion activity.  No signs of acute infection.  No electrolyte disturbance.  Concerned about persistent seizures.  Have consulted with neurology Dr. Selina Cooley.  Neurology team  will come see the patient in evaluation.  Care turned over to Dr. Doran Durand at shift change        Final Clinical Impression(s) / ED Diagnoses Final diagnoses:  Seizure disorder [facility])    Rx / DC Orders ED Discharge Orders     None         Linwood Dibbles, MD 11/23/22 1556

## 2022-11-24 ENCOUNTER — Inpatient Hospital Stay (HOSPITAL_COMMUNITY): Payer: 59

## 2022-11-24 ENCOUNTER — Encounter (HOSPITAL_COMMUNITY): Payer: Self-pay | Admitting: Internal Medicine

## 2022-11-24 DIAGNOSIS — Z8249 Family history of ischemic heart disease and other diseases of the circulatory system: Secondary | ICD-10-CM | POA: Diagnosis not present

## 2022-11-24 DIAGNOSIS — Z9049 Acquired absence of other specified parts of digestive tract: Secondary | ICD-10-CM | POA: Diagnosis not present

## 2022-11-24 DIAGNOSIS — Z803 Family history of malignant neoplasm of breast: Secondary | ICD-10-CM | POA: Diagnosis not present

## 2022-11-24 DIAGNOSIS — Z823 Family history of stroke: Secondary | ICD-10-CM | POA: Diagnosis not present

## 2022-11-24 DIAGNOSIS — F39 Unspecified mood [affective] disorder: Secondary | ICD-10-CM | POA: Diagnosis present

## 2022-11-24 DIAGNOSIS — Z820 Family history of epilepsy and other diseases of the nervous system: Secondary | ICD-10-CM | POA: Diagnosis not present

## 2022-11-24 DIAGNOSIS — Z87442 Personal history of urinary calculi: Secondary | ICD-10-CM | POA: Diagnosis not present

## 2022-11-24 DIAGNOSIS — Z79899 Other long term (current) drug therapy: Secondary | ICD-10-CM | POA: Diagnosis not present

## 2022-11-24 DIAGNOSIS — R569 Unspecified convulsions: Secondary | ICD-10-CM | POA: Diagnosis present

## 2022-11-24 DIAGNOSIS — F445 Conversion disorder with seizures or convulsions: Secondary | ICD-10-CM | POA: Diagnosis present

## 2022-11-24 DIAGNOSIS — R55 Syncope and collapse: Secondary | ICD-10-CM | POA: Diagnosis present

## 2022-11-24 DIAGNOSIS — R253 Fasciculation: Secondary | ICD-10-CM | POA: Diagnosis present

## 2022-11-24 DIAGNOSIS — Z833 Family history of diabetes mellitus: Secondary | ICD-10-CM | POA: Diagnosis not present

## 2022-11-24 DIAGNOSIS — R404 Transient alteration of awareness: Secondary | ICD-10-CM | POA: Diagnosis present

## 2022-11-24 DIAGNOSIS — G43909 Migraine, unspecified, not intractable, without status migrainosus: Secondary | ICD-10-CM | POA: Diagnosis present

## 2022-11-24 LAB — LEVETIRACETAM LEVEL: Levetiracetam Lvl: 8.8 ug/mL — ABNORMAL LOW (ref 10.0–40.0)

## 2022-11-24 MED ORDER — KETOROLAC TROMETHAMINE 15 MG/ML IJ SOLN
15.0000 mg | Freq: Once | INTRAMUSCULAR | Status: AC
  Administered 2022-11-24: 15 mg via INTRAVENOUS
  Filled 2022-11-24: qty 1

## 2022-11-24 MED ORDER — SODIUM CHLORIDE 0.9 % IV SOLN
25.0000 mg | Freq: Once | INTRAVENOUS | Status: AC
  Administered 2022-11-24: 25 mg via INTRAVENOUS
  Filled 2022-11-24: qty 1

## 2022-11-24 NOTE — ED Notes (Signed)
ED TO INPATIENT HANDOFF REPORT  ED Nurse Name and Phone #: [phone]  S Name/Age/Gender Marie Brown 32 y.o. female Room/Bed: 004C/004C  Code Status   Code Status: Full Code  Home/SNF/Other Home Patient oriented to: self, place, time, and situation Is this baseline? Yes   Triage Complete: Triage complete  Chief Complaint Seizure-like activity [facility]) [R56.9]  Triage Note PT BIB [facility] for focal seizure-like activity.  Family reports seizure like activity since 9am and lasting 15 min.  EMS reports twitching of left arm and leg episode once..  This RN observed left arm twitching on arrival. Pt appears postictal.  EMS reports was sitting upright on the toilet unassisted on arrival.  States that family suggests she had been sitting on the toilet with seizure activity since 9am.  EMs states pt arm was twitching while assisting them to move to stretcher. EMS reports GCS of 10.  EMS states PT has hx of Brain tumor per family.  Pt has a loop recorder and was recently in an accident around April d/t seizure.   138/84 100% 60 CBG 128   Allergies No Known Allergies  Level of Care/Admitting Diagnosis ED Disposition     ED Disposition  Admit   Condition  --   Comment  Hospital Area: [facility] [[id number]]  Level of Care: Progressive [102]  Admit to Progressive based on following criteria: NEUROLOGICAL AND NEUROSURGICAL complex patients with significant risk of instability, who do not meet ICU criteria, yet require close observation or frequent assessment (< / = every 2 - 4 hours) with medical / nursing intervention.  May admit patient to [facility] or [facility] if equivalent level of care is available:: No  Covid Evaluation: Asymptomatic - no recent exposure (last 10 days) testing not required  Diagnosis: Seizure-like activity [facility]) [[id number]]  Admitting Physician: Charlsie Quest [[id number]]  Attending Physician: Harold Hedge [[id number]]  Certification:: I certify  this patient will need inpatient services for at least 2 midnights  Estimated Length of Stay: 2          B Medical/Surgery History Past Medical History:  Diagnosis Date   Bipolar 2 disorder (HCC)    Carpal tunnel syndrome on both sides    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 ;   last MRI in care everywhere 08-25-2021   History of acute pyelonephritis 03/28/2022   admission in epic w/ UTI due to obstructive uropathy due to kidney stone   History of kidney stones    History of syncope    (04-30-2022  pt stated last episode 03-27-2022  in setting kidney stone pain )   syncope w/ collapse with recurrent near syncope/ syncope ;   cardiologsit --- dr Sherlyn Lees note 11-11-2021 work-up results in care everywhere event monitor showed benign w/ low burden PACs/ PVCs & evidence inappropriate ST;  normal ETT and echo   IDA (iron deficiency anemia)    Left ureteral calculus    Migraine    Palpitations    (04-30-2022  pt stated taking toprol daily has helped)   pt is scheduled for loop recorder insertion 05-08-2022 @ [facility]   Seizure-like activity (HCC)    (04-30-2022  pt stated last syncope/ seizure like activity 03-27-2022 in setting kidney stone pain)   neurologist-- dr Bea Laura. Charyl Dancer;   started 04/ 2023 with syncope w/ collapse had seizure like acitivity;   MRI  left frontal cerebral cavenoma in 2011 and last MRI in care everywhere 08-25-2021 same;  EEG 04-16-2022 care everywhere normal without seizure acitivy   Urgency of urination    Wears glasses    Past Surgical History:  Procedure Laterality Date   CYSTOSCOPY W/ URETERAL STENT PLACEMENT Left 03/28/2022   Procedure: CYSTOSCOPY WITH RETROGRADE PYELOGRAM/URETERAL STENT PLACEMENT;  Surgeon: Jannifer Hick, MD;  Location: [facility];  Service: Urology;  Laterality: Left;   CYSTOSCOPY/URETEROSCOPY/HOLMIUM LASER/STENT PLACEMENT Left 05/02/2022   Procedure: CYSTOSCOPY/LEFT URETEROSCOPY/HOLMIUM LASER/LEFT RETROGRADE PYELOGRAM/LEFT STENT PLACEMENT;   Surgeon: Jannifer Hick, MD;  Location[facility];  Service: Urology;  Laterality: Left;  60 MINUTES NEEDED FOR CASE.   FOOT SURGERY Bilateral 2005   bunion   LAPAROSCOPIC APPENDECTOMY  12/20/2009   @[facility]  by dr b. zeigler     A IV Location/Drains/Wounds Patient Lines/Drains/Airways Status     Active Line/Drains/Airways     Name Placement date Placement time Site Days   Peripheral IV 11/23/22 20 G Right Antecubital 11/23/22  1255  Antecubital  1   Ureteral Drain/Stent Left ureter 6 Fr. 05/02/22  0921  Left ureter  206            Intake/Output Last 24 hours  Intake/Output Summary (Last 24 hours) at 11/24/2022 1422 Last data filed at 11/24/2022 0836 Gross per 24 hour  Intake 2006.64 ml  Output --  Net 2006.64 ml    Labs/Imaging Results for orders placed or performed during the hospital encounter of 11/23/22 (from the past 48 hour(s))  Comprehensive metabolic panel     Status: Abnormal   Collection Time: 11/23/22  1:01 PM  Result Value Ref Range   Sodium 138 135 - 145 mmol/L   Potassium 3.6 3.5 - 5.1 mmol/L   Chloride 112 (H) 98 - 111 mmol/L   CO2 18 (L) 22 - 32 mmol/L   Glucose, Bld 99 70 - 99 mg/dL    Comment: Glucose reference range applies only to samples taken after fasting for at least 8 hours.   BUN 8 6 - 20 mg/dL   Creatinine, Ser 1.61 0.44 - 1.00 mg/dL   Calcium 9.1 8.9 - 09.6 mg/dL   Total Protein 7.2 6.5 - 8.1 g/dL   Albumin 4.2 3.5 - 5.0 g/dL   AST 23 15 - 41 U/L   ALT 23 0 - 44 U/L   Alkaline Phosphatase 41 38 - 126 U/L   Total Bilirubin 0.7 0.3 - 1.2 mg/dL   GFR, Estimated >04 >54 mL/min    Comment: (NOTE) Calculated using the CKD-EPI Creatinine Equation (2021)    Anion gap 8 5 - 15    Comment: Performed at [facility], [address], [place], [place] [address]  CBC with Differential/Platelet     Status: Abnormal   Collection Time: 11/23/22  1:01 PM  Result Value Ref Range   WBC 7.0 4.0 - 10.5 K/uL   RBC 3.87 3.87 - 5.11  MIL/uL   Hemoglobin 12.6 12.0 - 15.0 g/dL   HCT 91.4 78.2 - 95.6 %   MCV 101.3 (H) 80.0 - 100.0 fL   MCH 32.6 26.0 - 34.0 pg   MCHC 32.1 30.0 - 36.0 g/dL   RDW 21.3 08.6 - 57.8 %   Platelets 248 150 - 400 K/uL   nRBC 0.0 0.0 - 0.2 %   Neutrophils Relative % 79 %   Neutro Abs 5.5 1.7 - 7.7 K/uL   Lymphocytes Relative 13 %   Lymphs Abs 0.9 0.7 - 4.0 K/uL   Monocytes Relative 8 %  Monocytes Absolute 0.5 0.1 - 1.0 K/uL   Eosinophils Relative 0 %   Eosinophils Absolute 0.0 0.0 - 0.5 K/uL   Basophils Relative 0 %   Basophils Absolute 0.0 0.0 - 0.1 K/uL   Immature Granulocytes 0 %   Abs Immature Granulocytes 0.02 0.00 - 0.07 K/uL    Comment: Performed at [facility], [address], [place], [place] [address]  hCG, serum, qualitative     Status: None   Collection Time: 11/23/22  1:01 PM  Result Value Ref Range   Preg, Serum NEGATIVE NEGATIVE    Comment:        THE SENSITIVITY OF THIS METHODOLOGY IS >10 mIU/mL. Performed at [facility], [address], [place], [place] [address]   Rapid urine drug screen (hospital performed)     Status: Abnormal   Collection Time: 11/23/22  8:09 PM  Result Value Ref Range   Opiates NONE DETECTED NONE DETECTED   Cocaine NONE DETECTED NONE DETECTED   Benzodiazepines NONE DETECTED NONE DETECTED   Amphetamines NONE DETECTED NONE DETECTED   Tetrahydrocannabinol POSITIVE (A) NONE DETECTED   Barbiturates NONE DETECTED NONE DETECTED    Comment: (NOTE) DRUG SCREEN FOR MEDICAL PURPOSES ONLY.  IF CONFIRMATION IS NEEDED FOR ANY PURPOSE, NOTIFY LAB WITHIN 5 DAYS.  LOWEST DETECTABLE LIMITS FOR URINE DRUG SCREEN Drug Class                     Cutoff (ng/mL) Amphetamine and metabolites    1000 Barbiturate and metabolites    200 Benzodiazepine                 200 Opiates and metabolites        300 Cocaine and metabolites        300 THC                            50 Performed at [facility], [address], [place],  [place] [address]   Urinalysis, Routine w reflex microscopic -Urine, Clean Catch     Status: Abnormal   Collection Time: 11/23/22  8:09 PM  Result Value Ref Range   Color, Urine YELLOW YELLOW   APPearance CLEAR CLEAR   Specific Gravity, Urine 1.009 1.005 - 1.030   pH 8.0 5.0 - 8.0   Glucose, UA NEGATIVE NEGATIVE mg/dL   Hgb urine dipstick NEGATIVE NEGATIVE   Bilirubin Urine NEGATIVE NEGATIVE   Ketones, ur 5 (A) NEGATIVE mg/dL   Protein, ur NEGATIVE NEGATIVE mg/dL   Nitrite NEGATIVE NEGATIVE   Leukocytes,Ua NEGATIVE NEGATIVE    Comment: Performed at [facility], [address] [place], [place] [address]   No results found.  Pending Labs Unresulted Labs (From admission, onward)     Start     Ordered   11/23/22 1306  Levetiracetam level  Once,   URGENT        11/23/22 1305            Vitals/Pain Today's Vitals   11/24/22 0743 11/24/22 0744 11/24/22 0745 11/24/22 1200  BP:   128/77 130/75  Pulse:   78 91  Resp:   (!) 22 19  Temp:      TempSrc:      SpO2: 100%  100% 100%  PainSc:  5   Asleep    Isolation Precautions No active isolations  Medications Medications  sodium chloride 0.9 % bolus  1,000 mL (0 mLs Intravenous Stopped 11/23/22 1500)    And  0.9 %  sodium chloride infusion ( Intravenous Infusion Verify 11/24/22 0741)  enoxaparin (LOVENOX) injection 40 mg (40 mg Subcutaneous Given 11/23/22 2005)  LORazepam (ATIVAN) injection 2 mg (has no administration in time range)  acetaminophen (TYLENOL) tablet 650 mg (650 mg Oral Given 11/24/22 0330)    Or  acetaminophen (TYLENOL) suppository 650 mg ( Rectal See Alternative 11/24/22 0330)  senna-docusate (Senokot-S) tablet 1 tablet (has no administration in time range)  ondansetron (ZOFRAN) tablet 4 mg (has no administration in time range)    Or  ondansetron (ZOFRAN) injection 4 mg (has no administration in time range)  LORazepam (ATIVAN) injection 2 mg (2 mg Intravenous Given 11/23/22 1320)  levETIRAcetam (KEPPRA) IVPB 1000  mg/100 mL premix (0 mg Intravenous Stopped 11/23/22 1358)  ketorolac (TORADOL) 15 MG/ML injection 15 mg (15 mg Intravenous Given 11/24/22 0906)  promethazine (PHENERGAN) 25 mg in sodium chloride 0.9 % 1,000 mL infusion (0 mg Intravenous Stopped 11/24/22 1112)    Mobility walks     Focused Assessments Neuro Assessment Handoff:  Cardiac Rhythm: Normal sinus rhythm       Neuro Assessment: Within Defined Limits  R Recommendations: See Admitting Provider Note  Report given to:   Additional Notes:   Unsure what triggers seizures

## 2022-11-24 NOTE — Progress Notes (Signed)
LTM EEG hooked up and running - no initial skin breakdown.  Patient in ED

## 2022-11-24 NOTE — Progress Notes (Signed)
Subjective: NAEO.  Per mother no further seizure-like episodes.  ROS: negative except above  Examination  Vital signs in last 24 hours: Temp:  [97.6 F (36.4 C)-98.4 F (36.9 C)] 98.4 F (36.9 C) (07/08 0523) Pulse Rate:  [60-90] 78 (07/08 0745) Resp:  [11-25] 22 (07/08 0745) BP: (103-150)/(57-103) 128/77 (07/08 0745) SpO2:  [99 %-100 %] 100 % (07/08 0745)  General: lying in bed, NAD  Neuro: MS: Alert, oriented, follows commands CN: pupils equal and reactive,  EOMI, face symmetric, tongue midline, normal sensation over face, Motor: 5/5 strength in all 4 extremities Coordination: normal Gait: not tested  Basic Metabolic Panel: Recent Labs  Lab 11/23/22 1301  NA 138  K 3.6  CL 112*  CO2 18*  GLUCOSE 99  BUN 8  CREATININE 0.89  CALCIUM 9.1    CBC: Recent Labs  Lab 11/23/22 1301  WBC 7.0  NEUTROABS 5.5  HGB 12.6  HCT 39.2  MCV 101.3*  PLT 248    Coagulation Studies: No results for input(s): "LABPROT", "INR" in the last 72 hours.  Imaging CT head without contrast 09/02/2022: Possible mild forehead scalp hematoma/contusion. No skull fracture.  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 No superimposed acute intracranial abnormality.  MRI head without contrast 10/17/2022: Approximately 1.6 cm round cavernoma in the medial left frontal lobe. There is an associated elemental venous anomaly which is better seen on the prior MRI with contrast. There is no interval change. Otherwise normal.   MRI head with and without contrast 08/25/2021: A 12.3 x 14 x 12.6 mm lesion at the left frontal parasagittal reason, appears to be intra-axial along the medial margin of the frontal lobe. 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 There is no evidence of surrounding signal change/edema in the adjacent brain parenchyma. The outside MRI brain  report from 06/15/2009 also describes a left frontal cavernoma with adjacent developmental venous anomaly.  No acute intracranial abnormality given the limitation of motion.   EEG awake and asleep 04/16/2022: Reportedly normal   ASSESSMENT AND PLAN: 32 year old female with history of seizures on Keppra and Topamax admitted with breakthrough seizure-like activity.  Breakthrough seizures -Start video EEG monitoring to look for ictal-interictal activity -Will hold Keppra and Topamax for seizure provocation -Will also HV and photic stimulation tomorrow -Continue seizure precautions - PRN IV ativan for clinical seizures  I have spent a total of  36  minutes with the patient reviewing hospital notes,  test results, labs and examining the patient as well as establishing an assessment and plan that was discussed personally with the patient.  > 50% of time was spent in direct patient care.   Lindie Spruce Epilepsy [facility] For questions after 5pm please refer to AMION to reach the Neurologist on call

## 2022-11-24 NOTE — Progress Notes (Signed)
  Progress Note   Patient: Marie Brown:[id number] DOB: 1990-06-10 DOA: 11/23/2022     0 DOS: the patient was seen and examined on 11/24/2022   Brief hospital course: Marie Brown is a 32 y.o. female with medical history significant for left frontal cerebral cavernoma with syncope/seizure like activity, migraines, mood disorder who is admitted for evaluation of seizure-like activity.  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  With resolution of seizure-like episode. -Neurology following -Patient has been started on Wied EEG monitoring to look for ictal/interictal activity by neurologist. -Currently holding Keppra 1000 mg daily and topiramate 100 mg daily per neurology as the patient is undergoing monitoring, use as needed Ativan for clinical seizures during that time -Seizure precautions    Left frontal cerebral cavernoma: Follows with [facility] neurology.  Stable 1.6 cm cavernoma in the medial left frontal lobe noted on MRI head 10/17/2022.   Migraine headaches: Continue topiramate. She has an acute episode today treated with IV Toradol.   DVT prophylaxis: enoxaparin (LOVENOX) injection 40 mg Start: 11/23/22 1945      Subjective: Patient was seen and examined bedside today.  Patient complained of migraine headache today, no further seizures here.  Spoke to the patient's mother at bedside.  Physical Exam: Vitals:   11/24/22 0523 11/24/22 0743 11/24/22 0745 11/24/22 1200  BP:   128/77 130/75  Pulse:   78 91  Resp:   (!) 22 19  Temp: 98.4 F (36.9 C)     TempSrc: Oral     SpO2:  100% 100% 100%   Constitutional: Resting in bed, NAD, calm, comfortable Eyes: PERRL, lids and conjunctivae normal ENMT: Mucous membranes are moist. Posterior pharynx clear of any exudate or lesions.Normal dentition.  Neck: normal, supple, no masses. Respiratory: clear to auscultation bilaterally, no wheezing, no crackles. Normal  respiratory effort. No accessory muscle use.  Cardiovascular: Regular rate and rhythm, no murmurs / rubs / gallops. No extremity edema. 2+ pedal pulses. Abdomen: no tenderness, no masses palpated.  Musculoskeletal: no clubbing / cyanosis. No joint deformity upper and lower extremities. Good ROM, no contractures. Normal muscle tone.  Skin: no rashes, lesions, ulcers. No induration Neurologic: Sensation intact. Strength 5/5 in all 4.  Psychiatric: Alert and oriented x 3.  Labile mood.  Data Reviewed:  There are no new results to review at this time.  Family Communication: Spoke to the patient's mother at bedside  Disposition: Status is: Inpatient Remains inpatient appropriate because: Seizure disorder  Planned Discharge Destination: Home    Time spent: 35 minutes  Author: Harold Hedge, MD 11/24/2022 2:59 PM  For on call review [email or web address].

## 2022-11-24 NOTE — TOC CM/SW Note (Signed)
Transition of Care [facility]) - Inpatient Brief Assessment   Patient Details  Name: DEMMI FUGATE MRN: [id number] Date of Birth: 10-Dec-1990  Transition of Care [facility]) CM/SW Contact:    Gordy Clement, RN Phone Number: 11/24/2022, 3:56 PM   Clinical Narrative:  CM met with Patient bedside to complete brief assessment.  Patient lives alone but she does have a very supportive girlfriend that is available to assist if needed. Mom lives in [place].  Patient states she is independent at baseline although she has been out of work since April (Accident) .  She mentioned that "They took my food stamps away and I will lose my insurance at the end of July   Resources given to patient   Mom or girlfriend will transport home at DC.  TOC will continue to follow patient for any additional discharge needs    Transition of Care Asessment: Insurance and Status: Insurance coverage has been reviewed (Patient states that her insurance will run out the end of July) Patient has primary care physician: Yes Loistine Chance, Alonza Smoker, MD) Home environment has been reviewed: Lives home alone Prior level of function:: independent  Has been out of work since April Prior/Current Home Services: No current home services Social Determinants of Health Reivew: SDOH reviewed no interventions necessary Readmission risk has been reviewed: Yes (8%) Transition of care needs: transition of care needs identified, TOC will continue to follow (Will give information on community resources)

## 2022-11-24 NOTE — ED Notes (Signed)
Pt got up to use the bedside commode.

## 2022-11-24 NOTE — Progress Notes (Signed)
Pt has arrived to floor from ED via stretcher in no acute distress. Pt ambulatory from stretcher to hospital bed with steady gait. Pt alert and oriented x4, VSS. Respirations even and unlabored on RA. Pt oriented to room and instructed on use of call bell. Call bell within reach. Pt encouraged not to get up unassisted. Bed in low position. EEG tech at bedside to hook pt back up.

## 2022-11-25 ENCOUNTER — Other Ambulatory Visit (HOSPITAL_COMMUNITY): Payer: Self-pay

## 2022-11-25 DIAGNOSIS — R569 Unspecified convulsions: Secondary | ICD-10-CM | POA: Diagnosis not present

## 2022-11-25 MED ORDER — CLONAZEPAM 1 MG PO TABS
2.0000 mg | ORAL_TABLET | Freq: Two times a day (BID) | ORAL | 0 refills | Status: DC | PRN
  Filled 2022-11-25: qty 20, 5d supply, fill #0

## 2022-11-25 MED ORDER — CLONAZEPAM 0.25 MG PO TBDP: 2.0000 mg | ORAL_TABLET | Freq: Two times a day (BID) | ORAL | Status: DC | PRN

## 2022-11-25 NOTE — Discharge Summary (Addendum)
Marie Brown HQI:[id number] DOB: [birth year] DOA: 11/23/2022  PCP: Dan Maker, MD  Admit date: 11/23/2022  Discharge date: 11/25/2022  Admitted From: Home   Disposition:  Home   Recommendations for Outpatient Follow-up:   Follow up with PCP in 1-2 weeks  PCP Please obtain BMP/CBC, 2 view CXR in 1week,  (see Discharge instructions)   PCP Please follow up on the following pending results:    Home Health: None   Equipment/Devices: None  Consultations: Neuro Discharge Condition: Stable    CODE STATUS: Full    Diet Recommendation: Heart Healthy     Chief Complaint  Patient presents with   Seizures     Brief history of present illness from the day of admission and additional interim summary    33 y.o. 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  History is limited from patient as she does not recall the event of the day and is otherwise supplemented by EDP, chart review, and mother at bedside.   Around 940 this morning patient's girlfriend reported that patient developed seizure-like activity.  This was described as patient's arms constricted up with her hands and a grabbing/squeezing motion.  Legs were kicking violently.  Eyes were open but patient was not responding for approximately 15 minutes.  After this episode patient was "out of it" and not responsive or communicative for hours.  She was able to walk to the restroom where she ended up vomiting.   Around 11:30 AM patient's mother was attempting to help her get dressed when patient's right leg stiffened up in a raised straight position and was unable to be pushed down.  Her mother noted that patient  developed contracting appearance of both of her arms.  EMS was subsequently called and patient was brought to the ED for further evaluation.   Patient states that she has been taking all of her medications regularly including Keppra.                                                                 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  With resolution of seizure-like episode.  Seen by neurology, underwent overnight EEG which did not pick up any seizure activity, case discussed with neurologist Dr. Melynda Ripple on 11/25/2018 for discharge on home medications with doses unchanged, question of pseudoseizures/PNES also play symptom-free will be discharged home.   Left frontal cerebral cavernoma: Follows with [facility] neurology.  Stable 1.6 cm cavernoma in the medial left frontal lobe noted on MRI head 10/17/2022.  Requested to follow-up with a neurologist within a week of discharge along with PCP.  Migraine headaches: Continue topiramate.   Discharge diagnosis     Principal Problem:   Seizure-like activity [facility]    Discharge instructions    Discharge Instructions     Diet - low sodium heart healthy   Complete by: As directed    Discharge instructions   Complete by: As directed    Do not drive, operate heavy machinery, perform activities at heights, swimming or participation in water activities or provide baby sitting services until you have seen by Primary MD or a Neurologist and advised to do so again.  Follow with Primary MD Dan Maker, MD in 7 days   Get CBC, CMP, Magnesium -  checked next visit with your primary MD    Activity: As tolerated with Full fall precautions use walker/cane & assistance as needed  Disposition Home   Diet: Heart Healthy   Special Instructions: If you have smoked or chewed Tobacco  in the last 2 yrs please stop smoking, stop any regular Alcohol  and or any Recreational drug use.  On your next  visit with your primary care physician please Get Medicines reviewed and adjusted.  Please request your Prim.MD to go over all Hospital Tests and Procedure/Radiological results at the follow up, please get all Hospital records sent to your Prim MD by signing hospital release before you go home.  If you experience worsening of your admission symptoms, develop shortness of breath, life threatening emergency, suicidal or homicidal thoughts you must seek medical attention immediately by calling 911 or calling your MD immediately  if symptoms less severe.  You Must read complete instructions/literature along with all the possible adverse reactions/side effects for all the Medicines you take and that have been prescribed to you. Take any new Medicines after you have completely understood and accpet all the possible adverse reactions/side effects.   Increase activity slowly   Complete by: As directed        Discharge Medications   Allergies as of 11/25/2022   No Known Allergies      Medication List     TAKE these medications    clonazepam 2 MG disintegrating tablet Commonly known as: KLONOPIN Take 1 tablet (2 mg total) by mouth 2 (two) times daily as needed for seizure.   levETIRAcetam 500 MG 24 hr tablet Commonly known as: KEPPRA XR Take 500 mg by mouth at bedtime.   methocarbamol 500 MG tablet Commonly known as: ROBAXIN Take 1 tablet (500 mg total) by mouth every 6 (six) hours as needed for muscle spasms.   metoprolol succinate 25 MG 24 hr tablet Commonly known as: TOPROL-XL Take 25 mg by mouth daily.   nadolol 20 MG tablet Commonly known as: CORGARD Take 20 mg by mouth daily as needed. Pt takes as needed for mirgaines twice weekly only   ondansetron 4 MG disintegrating tablet Commonly known as: ZOFRAN-ODT Take 1 tablet (4 mg total) by mouth every 8 (eight) hours as needed.   ondansetron 8 MG disintegrating tablet Commonly known as: ZOFRAN-ODT Take 1 tablet (8 mg total) by  mouth every 8 (eight) hours.   promethazine 25 MG tablet Commonly known as: PHENERGAN Take 25 mg by mouth every 6 (six) hours as needed for nausea or vomiting.   tiZANidine 2 MG tablet Commonly known as: ZANAFLEX Take 2 mg by mouth every 6 (six) hours as needed for muscle spasms.   topiramate 25 MG tablet Commonly known as: TOPAMAX Take 25 mg by mouth daily.   valACYclovir 1000 MG tablet  Commonly known as: VALTREX Take 1,000 mg by mouth daily.         Follow-up Information     Dan Maker, MD. Schedule an appointment as soon as possible for a visit in 1 week(s).   Specialty: Family Medicine Why: And your neurologist within a week of discharge Contact information: [address] [place] [address] [phone]                 Major procedures and Radiology Reports - PLEASE review detailed and final reports thoroughly  -       Overnight EEG with video  Result Date: 11/25/2022 Charlsie Quest, MD     11/25/2022  9:21 AM Patient Name: Marie Brown MRN: [id number] Epilepsy Attending: Charlsie Quest Referring Physician/Provider: Kara Mead, NP Duration: 11/24/2022 8657 to 11/25/2022 8469 Patient history: 32 year old female with history of seizures on Keppra and Topamax admitted with breakthrough seizure-like activity. EEG to evaluate for seizure. Level of alertness: Awake, asleep AEDs during EEG study: None Technical aspects: This EEG study was done with scalp electrodes positioned according to the 10-20 International system of electrode placement. Electrical activity was reviewed with band pass filter of 1-70Hz , sensitivity of 7 uV/mm, display speed of 75mm/sec with a 60Hz  notched filter applied as appropriate. EEG data were recorded continuously and digitally stored.  Video monitoring was available and reviewed as appropriate. Description: The posterior dominant rhythm consists of 9-10 Hz activity of moderate voltage (25-35 uV) seen predominantly in  posterior head regions, symmetric and reactive to eye opening and eye closing. Sleep was characterized by vertex waves, sleep spindles (12 to 14 Hz), maximal frontocentral region. Hyperventilation and photic stimulation were not performed.   IMPRESSION: This study is within normal limits. No seizures or epileptiform discharges were seen throughout the recording. A normal interictal EEG does not exclude the diagnosis of epilepsy. Priyanka Annabelle Harman    Micro Results    No results found for this or any previous visit (from the past 240 hour(s)).  Today   Subjective    Marie Brown today has no headache,no chest abdominal pain,no new weakness tingling or numbness, feels much better wants to go home today.    Objective   Blood pressure 136/70, pulse 65, temperature 98 F (36.7 C), temperature source Oral, resp. rate 17, height 5\' 3"  (1.6 m), weight 73 kg, SpO2 100 %.   Intake/Output Summary (Last 24 hours) at 11/25/2022 1049 Last data filed at 11/24/2022 1645 Gross per 24 hour  Intake 227.82 ml  Output --  Net 227.82 ml    Exam  Awake Alert, No new F.N deficits,    [place].AT,PERRAL Supple Neck,   Symmetrical Chest wall movement, Good air movement bilaterally, CTAB RRR,No Gallops,   +ve B.Sounds, Abd Soft, Non tender,  No Cyanosis, Clubbing or edema    Data Review   Recent Labs  Lab 11/23/22 1301  WBC 7.0  HGB 12.6  HCT 39.2  PLT 248  MCV 101.3*  MCH 32.6  MCHC 32.1  RDW 13.2  LYMPHSABS 0.9  MONOABS 0.5  EOSABS 0.0  BASOSABS 0.0    Recent Labs  Lab 11/23/22 1301  NA 138  K 3.6  CL 112*  CO2 18*  ANIONGAP 8  GLUCOSE 99  BUN 8  CREATININE 0.89  AST 23  ALT 23  ALKPHOS 41  BILITOT 0.7  ALBUMIN 4.2  CALCIUM 9.1    Total Time in preparing paper work, data evaluation and todays exam -  35 minutes  Signature  -    Susa Raring M.D on 11/25/2022 at 10:49 AM   -  To page go to [email or web address]

## 2022-11-25 NOTE — Discharge Instructions (Signed)
Do not drive, operate heavy machinery, perform activities at heights, swimming or participation in water activities or provide baby sitting services until you have seen by Primary MD or a Neurologist and advised to do so again.  Follow with Primary MD Dan Maker, MD in 7 days   Get CBC, CMP, Magnesium -  checked next visit with your primary MD    Activity: As tolerated with Full fall precautions use walker/cane & assistance as needed  Disposition Home   Diet: Heart Healthy   Special Instructions: If you have smoked or chewed Tobacco  in the last 2 yrs please stop smoking, stop any regular Alcohol  and or any Recreational drug use.  On your next visit with your primary care physician please Get Medicines reviewed and adjusted.  Please request your Prim.MD to go over all Hospital Tests and Procedure/Radiological results at the follow up, please get all Hospital records sent to your Prim MD by signing hospital release before you go home.  If you experience worsening of your admission symptoms, develop shortness of breath, life threatening emergency, suicidal or homicidal thoughts you must seek medical attention immediately by calling 911 or calling your MD immediately  if symptoms less severe.  You Must read complete instructions/literature along with all the possible adverse reactions/side effects for all the Medicines you take and that have been prescribed to you. Take any new Medicines after you have completely understood and accpet all the possible adverse reactions/side effects.

## 2022-11-25 NOTE — Progress Notes (Signed)
Subjective: NAEO. No more seizure-like episodes.  Per mother, had jerking of arms and legs and one eye was deviated out, staring for very long time (hours) before arrival to ED.  ROS: negative except above  Examination  Vital signs in last 24 hours: Temp:  [98 F (36.7 C)-98.3 F (36.8 C)] 98 F (36.7 C) (07/09 0913) Pulse Rate:  [64-98] 65 (07/09 0913) Resp:  [12-25] 17 (07/09 0913) BP: (125-140)/(68-85) 136/70 (07/09 0913) SpO2:  [98 %-100 %] 100 % (07/09 0913) Weight:  [73 kg] 73 kg (07/08 1515)  General: lying in bed, NAD  Neuro: MS: Alert, oriented, follows commands CN: pupils equal and reactive,  EOMI, face symmetric, tongue midline, normal sensation over face, Motor: 5/5 strength in all 4 extremities Coordination: normal Gait: not tested  Basic Metabolic Panel: Recent Labs  Lab 11/23/22 1301  NA 138  K 3.6  CL 112*  CO2 18*  GLUCOSE 99  BUN 8  CREATININE 0.89  CALCIUM 9.1    CBC: Recent Labs  Lab 11/23/22 1301  WBC 7.0  NEUTROABS 5.5  HGB 12.6  HCT 39.2  MCV 101.3*  PLT 248     Coagulation Studies: No results for input(s): "LABPROT", "INR" in the last 72 hours.  Imaging No new brain imaging overnight  ASSESSMENT AND PLAN: 32 year old female with history of seizures on Keppra and Topamax admitted with breakthrough seizure-like activity.   Breakthrough seizures -Patient does have a cavernoma in left frontal parasagittal region which could potentially be the focus of her epilepsy.  However, the semiology of the episode described by mother as well as normal EEG of medications (albeit only for 24 hours) makes it more suspicious for nonepileptic event.  Patients with epilepsy can have nonepileptic events as well -Discussed about potentially staying for another 24 to 48 hours to continue to monitor while off medications.  However patient needs to go home today.  Discussed about scheduling an EMU admission with primary epileptologist.  Patient follows up  with [facility].  States she has an appointment scheduled and will discuss about EMU with her neurologist -Okay to resume home antiseizure medications.  Due to low suspicion for epileptic seizure, will not change dose of ASMs for now -Did discuss rescue medication: Intranasal Valtoco 15 mg (7.5 mg in each nostril) for seizure lasting more than 2 minutes.  If not approved by insurance, recommend clonazepam 2 mg oral dissolving tablet instead -Continue seizure precautions including do not drive  Seizure precautions: Per [facility] [place] statutes, patients with seizures are not allowed to drive until they have been seizure-free for six months and cleared by a physician    Use caution when using heavy equipment or power tools. Avoid working on ladders or at heights. Take showers instead of baths. Ensure the water temperature is not too high on the home water heater. Do not go swimming alone. Do not lock yourself in a room alone (i.e. bathroom). When caring for infants or small children, sit down when holding, feeding, or changing them to minimize risk of injury to the child in the event you have a seizure. Maintain good sleep hygiene. Avoid alcohol.    If patient has another seizure, call 911 and bring them back to the ED if: A.  The seizure lasts longer than 5 minutes.      B.  The patient doesn't wake shortly after the seizure or has new problems such as difficulty seeing, speaking or moving following the seizure C.  The patient was  injured during the seizure D.  The patient has a temperature over 102 F (39C) E.  The patient vomited during the seizure and now is having trouble breathing    During the Seizure   - First, ensure adequate ventilation and place patients on the floor on their left side  Loosen clothing around the neck and ensure the airway is patent. If the patient is clenching the teeth, do not force the mouth open with any object as this can cause severe damage - Remove all items from  the surrounding that can be hazardous. The patient may be oblivious to what's happening and may not even know what he or she is doing. If the patient is confused and wandering, either gently guide him/her away and block access to outside areas - Reassure the individual and be comforting - Call 911. In most cases, the seizure ends before EMS arrives. However, there are cases when seizures may last over 3 to 5 minutes. Or the individual may have developed breathing difficulties or severe injuries. If a pregnant patient or a person with diabetes develops a seizure, it is prudent to call an ambulance. - Finally, if the patient does not regain full consciousness, then call EMS. Most patients will remain confused for about 45 to 90 minutes after a seizure, so you must use judgment in calling for help.    After the Seizure (Postictal Stage)   After a seizure, most patients experience confusion, fatigue, muscle pain and/or a headache. Thus, one should permit the individual to sleep. For the next few days, reassurance is essential. Being calm and helping reorient the person is also of importance.   Most seizures are painless and end spontaneously. Seizures are not harmful to others but can lead to complications such as stress on the lungs, brain and the heart. Individuals with prior lung problems may develop labored breathing and respiratory distress.    I have spent a total of  40  minutes with the patient reviewing hospital notes,  test results, labs and examining the patient as well as establishing an assessment and plan that was discussed personally with the patient.  > 50% of time was spent in direct patient care.      Lindie Spruce Epilepsy [facility] For questions after 5pm please refer to AMION to reach the Neurologist on call

## 2022-11-25 NOTE — Progress Notes (Signed)
LTM maint complete - no skin breakdown under: fz, C4. Activations completed

## 2022-11-25 NOTE — Procedures (Signed)
Patient Name: Marie Brown  MRN: [id number]  Epilepsy Attending: Charlsie Quest  Referring Physician/Provider: Kara Mead, NP  Duration: 11/25/2022 4098 to 11/25/2022 1129   Patient history: 32 year old female with history of seizures on Keppra and Topamax admitted with breakthrough seizure-like activity. EEG to evaluate for seizure.   Level of alertness: Awake   AEDs during EEG study: None   Technical aspects: This EEG study was done with scalp electrodes positioned according to the 10-20 International system of electrode placement. Electrical activity was reviewed with band pass filter of 1-70Hz , sensitivity of 7 uV/mm, display speed of 26mm/sec with a 60Hz  notched filter applied as appropriate. EEG data were recorded continuously and digitally stored.  Video monitoring was available and reviewed as appropriate.   Description: The posterior dominant rhythm consists of 9-10 Hz activity of moderate voltage (25-35 uV) seen predominantly in posterior head regions, symmetric and reactive to eye opening and eye closing.  No EEG change was seen during hyperventilation. Photic driving was seen during photic stimulation.   IMPRESSION: This study is within normal limits. No seizures or epileptiform discharges were seen throughout the recording.   A normal interictal EEG does not exclude the diagnosis of epilepsy.   [name]

## 2022-11-25 NOTE — Progress Notes (Signed)
vLTM discontinued  No skin breakdown noted at all skin sites  [facility] notified 

## 2022-11-25 NOTE — Procedures (Addendum)
Patient Name: Marie Brown  MRN: [id number]  Epilepsy Attending: Charlsie Quest  Referring Physician/Provider: Kara Mead, NP  Duration: 11/24/2022 4742 to 11/25/2022 5956  Patient history: 32 year old female with history of seizures on Keppra and Topamax admitted with breakthrough seizure-like activity. EEG to evaluate for seizure.  Level of alertness: Awake, asleep  AEDs during EEG study: None  Technical aspects: This EEG study was done with scalp electrodes positioned according to the 10-20 International system of electrode placement. Electrical activity was reviewed with band pass filter of 1-70Hz , sensitivity of 7 uV/mm, display speed of 35mm/sec with a 60Hz  notched filter applied as appropriate. EEG data were recorded continuously and digitally stored.  Video monitoring was available and reviewed as appropriate.  Description: The posterior dominant rhythm consists of 9-10 Hz activity of moderate voltage (25-35 uV) seen predominantly in posterior head regions, symmetric and reactive to eye opening and eye closing. Sleep was characterized by vertex waves, sleep spindles (12 to 14 Hz), maximal frontocentral region.  IMPRESSION: This study is within normal limits. No seizures or epileptiform discharges were seen throughout the recording.  A normal interictal EEG does not exclude the diagnosis of epilepsy.  [name] Annabelle Harman

## 2022-11-26 ENCOUNTER — Telehealth: Payer: Self-pay | Admitting: *Deleted

## 2022-11-26 NOTE — Transitions of Care (Post Inpatient/ED Visit) (Signed)
   11/26/2022  Name: Marie Brown MRN: [id number] DOB: July 25, 1990  Today's TOC FU Call Status: Today's TOC FU Call Status:: Unsuccessul Call (1st Attempt) Unsuccessful Call (1st Attempt) Date: 11/26/22  Attempted to reach the patient regarding the most recent Inpatient/ED visit.  Follow Up Plan: Additional outreach attempts will be made to reach the patient to complete the Transitions of Care (Post Inpatient/ED visit) call.   Estanislado Emms RN, BSN [place]  Managed [facility] RN Care Coordinator [phone]

## 2022-11-27 ENCOUNTER — Telehealth: Payer: Self-pay | Admitting: *Deleted

## 2022-11-27 NOTE — Transitions of Care (Post Inpatient/ED Visit) (Signed)
   11/27/2022  Name: Marie Brown MRN: [id number] DOB: Aug 01, 1990  Today's TOC FU Call Status: Today's TOC FU Call Status:: Unsuccessful Call (2nd Attempt) Unsuccessful Call (2nd Attempt) Date: 11/27/22  Attempted to reach the patient regarding the most recent Inpatient/ED visit.  Follow Up Plan: Additional outreach attempts will be made to reach the patient to complete the Transitions of Care (Post Inpatient/ED visit) call.   Estanislado Emms RN, BSN [place]  Managed [facility] RN Care Coordinator [phone]

## 2023-01-06 DIAGNOSIS — R55 Syncope and collapse: Secondary | ICD-10-CM | POA: Diagnosis not present

## 2023-01-15 DIAGNOSIS — G40219 Localization-related (focal) (partial) symptomatic epilepsy and epileptic syndromes with complex partial seizures, intractable, without status epilepticus: Secondary | ICD-10-CM | POA: Diagnosis not present

## 2023-02-06 DIAGNOSIS — R55 Syncope and collapse: Secondary | ICD-10-CM | POA: Diagnosis not present

## 2023-02-19 ENCOUNTER — Ambulatory Visit: Payer: Medicaid Other

## 2023-02-19 VITALS — BP 119/79 | HR 78 | Ht 63.0 in | Wt 165.4 lb

## 2023-02-19 DIAGNOSIS — Z3201 Encounter for pregnancy test, result positive: Secondary | ICD-10-CM

## 2023-02-19 DIAGNOSIS — N912 Amenorrhea, unspecified: Secondary | ICD-10-CM

## 2023-02-19 DIAGNOSIS — Z3200 Encounter for pregnancy test, result unknown: Secondary | ICD-10-CM

## 2023-02-19 LAB — POCT URINE PREGNANCY: Preg Test, Ur: POSITIVE — AB

## 2023-02-19 NOTE — Progress Notes (Addendum)
Marie Brown presents today for UPT. She has no unusual complaints and complains of nausea with vomiting for 3 weeks.  LMP: 01/19/23    OBJECTIVE: Appears well, in no apparent distress.  OB History     Gravida  1   Para  0   Term  0   Preterm  0   AB  0   Living  0      SAB  0   IAB  0   Ectopic  0   Multiple  0   Live Births             Home UPT Result: positive  In-Office UPT result: positive I have reviewed the patient's medical, obstetrical, social, and family histories, and medications.   ASSESSMENT: Positive pregnancy test  PLAN Prenatal care to be completed at:   [facility]

## 2023-02-20 ENCOUNTER — Encounter: Payer: Self-pay | Admitting: Obstetrics & Gynecology

## 2023-03-08 ENCOUNTER — Emergency Department (HOSPITAL_BASED_OUTPATIENT_CLINIC_OR_DEPARTMENT_OTHER)
Admission: EM | Admit: 2023-03-08 | Discharge: 2023-03-08 | Discharge status: Against Medical Advice | Payer: Medicaid Other | Attending: Emergency Medicine | Admitting: Emergency Medicine

## 2023-03-08 ENCOUNTER — Emergency Department (HOSPITAL_BASED_OUTPATIENT_CLINIC_OR_DEPARTMENT_OTHER): Payer: Medicaid Other

## 2023-03-08 ENCOUNTER — Encounter (HOSPITAL_BASED_OUTPATIENT_CLINIC_OR_DEPARTMENT_OTHER): Payer: Self-pay | Admitting: Emergency Medicine

## 2023-03-08 DIAGNOSIS — O9A311 Physical abuse complicating pregnancy, first trimester: Secondary | ICD-10-CM | POA: Diagnosis present

## 2023-03-08 DIAGNOSIS — Z3A01 Less than 8 weeks gestation of pregnancy: Secondary | ICD-10-CM | POA: Diagnosis not present

## 2023-03-08 DIAGNOSIS — R519 Headache, unspecified: Secondary | ICD-10-CM | POA: Diagnosis not present

## 2023-03-08 DIAGNOSIS — R6884 Jaw pain: Secondary | ICD-10-CM | POA: Diagnosis not present

## 2023-03-08 DIAGNOSIS — O9A211 Injury, poisoning and certain other consequences of external causes complicating pregnancy, first trimester: Secondary | ICD-10-CM | POA: Diagnosis not present

## 2023-03-08 DIAGNOSIS — T7411XA Adult physical abuse, confirmed, initial encounter: Secondary | ICD-10-CM | POA: Diagnosis not present

## 2023-03-08 LAB — COMPREHENSIVE METABOLIC PANEL
ALT: 15 U/L (ref 0–44)
AST: 16 U/L (ref 15–41)
Albumin: 4.8 g/dL (ref 3.5–5.0)
Alkaline Phosphatase: 38 U/L (ref 38–126)
Anion gap: 11 (ref 5–15)
BUN: 8 mg/dL (ref 6–20)
CO2: 19 mmol/L — ABNORMAL LOW (ref 22–32)
Calcium: 9.4 mg/dL (ref 8.9–10.3)
Chloride: 107 mmol/L (ref 98–111)
Creatinine, Ser: 0.6 mg/dL (ref 0.44–1.00)
GFR, Estimated: >60 mL/min (ref >60)
Glucose, Bld: 93 mg/dL (ref 70–99)
Potassium: 3.6 mmol/L (ref 3.5–5.1)
Sodium: 137 mmol/L (ref 135–145)
Total Bilirubin: 0.4 mg/dL (ref 0.3–1.2)
Total Protein: 7.3 g/dL (ref 6.5–8.1)

## 2023-03-08 LAB — CBC WITH DIFFERENTIAL/PLATELET
Abs Immature Granulocytes: 0.02 10*3/uL (ref 0.00–0.07)
Basophils Absolute: 0 10*3/uL (ref 0.0–0.1)
Basophils Relative: 0 %
Eosinophils Absolute: 0 10*3/uL (ref 0.0–0.5)
Eosinophils Relative: 0 %
HCT: 36.6 % (ref 36.0–46.0)
Hemoglobin: 12 g/dL (ref 12.0–15.0)
Immature Granulocytes: 0 %
Lymphocytes Relative: 21 %
Lymphs Abs: 1.7 10*3/uL (ref 0.7–4.0)
MCH: 31.5 pg (ref 26.0–34.0)
MCHC: 32.8 g/dL (ref 30.0–36.0)
MCV: 96.1 fL (ref 80.0–100.0)
Monocytes Absolute: 0.4 10*3/uL (ref 0.1–1.0)
Monocytes Relative: 6 %
Neutro Abs: 5.7 10*3/uL (ref 1.7–7.7)
Neutrophils Relative %: 73 %
Platelets: 285 10*3/uL (ref 150–400)
RBC: 3.81 MIL/uL — ABNORMAL LOW (ref 3.87–5.11)
RDW: 12.8 % (ref 11.5–15.5)
WBC: 7.8 10*3/uL (ref 4.0–10.5)
nRBC: 0 % (ref 0.0–0.2)

## 2023-03-08 LAB — TROPONIN I (HIGH SENSITIVITY): Troponin I (High Sensitivity): 5 ng/L (ref <18)

## 2023-03-08 LAB — HCG, QUANTITATIVE, PREGNANCY: hCG, Beta Chain, Quant, S: 1744 m[IU]/mL — ABNORMAL HIGH (ref <5)

## 2023-03-08 MED ORDER — RHO D IMMUNE GLOBULIN 1500 UNIT/2ML IJ SOSY: 300.0000 ug | PREFILLED_SYRINGE | Freq: Once | INTRAMUSCULAR | Status: DC

## 2023-03-08 MED ORDER — KETOROLAC TROMETHAMINE 15 MG/ML IJ SOLN: 15.0000 mg | Freq: Once | INTRAMUSCULAR | Status: DC

## 2023-03-08 MED ORDER — RHO D IMMUNE GLOBULIN 1500 UNIT/2ML IJ SOSY
50.0000 ug | PREFILLED_SYRINGE | Freq: Once | INTRAMUSCULAR | Status: AC
  Administered 2023-03-08: 49.5 ug via INTRAMUSCULAR
  Filled 2023-03-08: qty 2

## 2023-03-08 MED ORDER — ACETAMINOPHEN 500 MG PO TABS
1000.0000 mg | ORAL_TABLET | Freq: Once | ORAL | Status: AC
  Administered 2023-03-08: 1000 mg via ORAL
  Filled 2023-03-08: qty 2

## 2023-03-08 NOTE — ED Notes (Addendum)
-  Attempted to call patient at 908pm to see if she arrived at [facility] or plans to go to [facility] due to her still being listed on our board. Phone went straight to voicemail. Previous notes state patient left to head to [facility] around 6pm.
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
## 2023-03-08 NOTE — ED Notes (Addendum)
Dr. Doran Durand aware that pt did not go to [facility] as instructed and that an attempt was made to call patient.  States to remove pt from the system.

## 2023-03-08 NOTE — Discharge Instructions (Signed)
Please proceed directly to the [facility] emergency room to get a OB ultrasound for your vaginal bleeding.

## 2023-03-08 NOTE — ED Notes (Signed)
Patient transferred POV to [facility] for Ultrasound services.  Patient verbalized understanding of transfer.

## 2023-03-09 ENCOUNTER — Emergency Department (HOSPITAL_COMMUNITY): Payer: Medicaid Other

## 2023-03-09 ENCOUNTER — Encounter (HOSPITAL_COMMUNITY): Payer: Self-pay

## 2023-03-09 ENCOUNTER — Emergency Department (HOSPITAL_COMMUNITY)
Admission: EM | Admit: 2023-03-09 | Discharge: 2023-03-09 | Discharge status: Against Medical Advice | Payer: Medicaid Other | Attending: Emergency Medicine | Admitting: Emergency Medicine

## 2023-03-09 ENCOUNTER — Other Ambulatory Visit: Payer: Self-pay

## 2023-03-09 DIAGNOSIS — Z5321 Procedure and treatment not carried out due to patient leaving prior to being seen by health care provider: Secondary | ICD-10-CM | POA: Diagnosis not present

## 2023-03-09 DIAGNOSIS — Z3A01 Less than 8 weeks gestation of pregnancy: Secondary | ICD-10-CM | POA: Insufficient documentation

## 2023-03-09 DIAGNOSIS — R55 Syncope and collapse: Secondary | ICD-10-CM | POA: Diagnosis not present

## 2023-03-09 DIAGNOSIS — O209 Hemorrhage in early pregnancy, unspecified: Secondary | ICD-10-CM | POA: Insufficient documentation

## 2023-03-09 LAB — CBC WITH DIFFERENTIAL/PLATELET
Abs Immature Granulocytes: 0.03 10*3/uL (ref 0.00–0.07)
Basophils Absolute: 0 10*3/uL (ref 0.0–0.1)
Basophils Relative: 0 %
Eosinophils Absolute: 0 10*3/uL (ref 0.0–0.5)
Eosinophils Relative: 0 %
HCT: 39.6 % (ref 36.0–46.0)
Hemoglobin: 13 g/dL (ref 12.0–15.0)
Immature Granulocytes: 0 %
Lymphocytes Relative: 24 %
Lymphs Abs: 2.1 10*3/uL (ref 0.7–4.0)
MCH: 32.2 pg (ref 26.0–34.0)
MCHC: 32.8 g/dL (ref 30.0–36.0)
MCV: 98 fL (ref 80.0–100.0)
Monocytes Absolute: 0.5 10*3/uL (ref 0.1–1.0)
Monocytes Relative: 6 %
Neutro Abs: 5.8 10*3/uL (ref 1.7–7.7)
Neutrophils Relative %: 70 %
Platelets: 298 10*3/uL (ref 150–400)
RBC: 4.04 MIL/uL (ref 3.87–5.11)
RDW: 12.8 % (ref 11.5–15.5)
WBC: 8.5 10*3/uL (ref 4.0–10.5)
nRBC: 0 % (ref 0.0–0.2)

## 2023-03-09 LAB — BASIC METABOLIC PANEL
Anion gap: 11 (ref 5–15)
BUN: 14 mg/dL (ref 6–20)
CO2: 23 mmol/L (ref 22–32)
Calcium: 9.8 mg/dL (ref 8.9–10.3)
Chloride: 102 mmol/L (ref 98–111)
Creatinine, Ser: 0.69 mg/dL (ref 0.44–1.00)
GFR, Estimated: >60 mL/min (ref >60)
Glucose, Bld: 96 mg/dL (ref 70–99)
Potassium: 3.5 mmol/L (ref 3.5–5.1)
Sodium: 136 mmol/L (ref 135–145)

## 2023-03-09 LAB — HCG, QUANTITATIVE, PREGNANCY: hCG, Beta Chain, Quant, S: 413 m[IU]/mL — ABNORMAL HIGH (ref <5)

## 2023-03-09 NOTE — ED Notes (Signed)
I called patient to recheck vitals and no one responded 

## 2023-03-09 NOTE — ED Notes (Signed)
I called patient for a 2nd time for vitals and no one responded

## 2023-03-09 NOTE — ED Provider Triage Note (Signed)
Emergency Medicine Provider Triage Evaluation Note  MELESSIA CAPELLA , a 32 y.o. female  was evaluated in triage.  Pt complains of vaginal bleeding and pelvic cramping.  Review of Systems  Positive:  Negative:   Physical Exam  BP (!) 137/93 (BP Location: Right Arm)   Pulse 77   Temp 98.8 F (37.1 C) (Oral)   Resp 16   Ht 5\' 3"  (1.6 m)   Wt 74.8 kg   LMP 01/19/2023   SpO2 100%   BMI 29.23 kg/m  Gen:   Awake, no distress   Resp:  Normal effort  MSK:   Moves extremities without difficulty  Other:    Medical Decision Making  Medically screening exam initiated at 3:40 PM.  Appropriate orders placed.  ROSANGEL BORNHOLDT was informed that the remainder of the evaluation will be completed by another provider, this initial triage assessment does not replace that evaluation, and the importance of remaining in the ED until their evaluation is complete.  Hx dysmenorrhea. [redacted] weeks pregnant. Confirmed with UPT - no [place] yet.  Past hx of miscarriage. Has had light spotting and pelvic cramping x4 days. 6/10 severity. Was seen at [facility] last night. LMP 01/19/2023. G2P0.   Dorthy Cooler, [place] 03/09/23 [phone]

## 2023-03-09 NOTE — ED Triage Notes (Signed)
Patient stated she has had vaginal bleeding for 4 days. Stated she is [redacted] weeks pregnant. [facility] sent her for vaginal ultrasound. 2nd time being pregnant, miscarriage the first pregnancy.

## 2023-03-09 NOTE — ED Notes (Signed)
I called patient for the 3rd time for vital sign recheck and no one responded

## 2023-03-23 ENCOUNTER — Encounter: Payer: Self-pay | Admitting: Neurology

## 2023-03-23 ENCOUNTER — Ambulatory Visit: Payer: Medicaid Other | Admitting: Neurology

## 2023-04-09 ENCOUNTER — Encounter: Payer: Medicaid Other | Admitting: Obstetrics & Gynecology

## 2023-04-09 ENCOUNTER — Other Ambulatory Visit: Payer: Medicaid Other

## 2023-04-09 DIAGNOSIS — R55 Syncope and collapse: Secondary | ICD-10-CM | POA: Diagnosis not present

## 2023-07-11 DIAGNOSIS — R55 Syncope and collapse: Secondary | ICD-10-CM | POA: Diagnosis not present

## 2023-07-16 ENCOUNTER — Encounter: Payer: Self-pay | Admitting: Neurology

## 2023-07-16 ENCOUNTER — Ambulatory Visit: Payer: Medicaid Other | Admitting: Neurology

## 2023-07-16 VITALS — BP 110/70 | Ht 63.0 in | Wt 180.0 lb

## 2023-07-16 DIAGNOSIS — G40209 Localization-related (focal) (partial) symptomatic epilepsy and epileptic syndromes with complex partial seizures, not intractable, without status epilepticus: Secondary | ICD-10-CM | POA: Diagnosis not present

## 2023-07-16 DIAGNOSIS — Z5181 Encounter for therapeutic drug level monitoring: Secondary | ICD-10-CM

## 2023-07-16 MED ORDER — LEVETIRACETAM ER 500 MG PO TB24: 500.0000 mg | ORAL_TABLET | Freq: Two times a day (BID) | ORAL | 3 refills | Status: AC

## 2023-07-16 NOTE — Progress Notes (Signed)
 [facility]  PATIENT: Marie Brown DOB: 17-Jun-1990  REQUESTING CLINICIAN: Dan Maker, MD HISTORY FROM: Patient and chart review  REASON FOR VISIT: Seizure    HISTORICAL  CHIEF COMPLAINT:  Chief Complaint  Patient presents with   New Patient (Initial Visit)    Rm 13, alone, sz sytatred in 2023, last sz in 11/2022. Denies SI/HI, medication compliance, establishing new neurologist    HISTORY OF PRESENT ILLNESS:  This is a 33 year old woman past medical history of left frontal cavernoma and focal epilepsy who is presenting to establish care.  Patient tells me she was diagnosed with cavernoma back in 2011 but she did have her first seizure in April 2023.  She tells me she was at work, was talking to the CNA and next thing she was on the floor with people around her.  She was very confused.  From there, she was started on Keppra 500 mg nightly, she continued to have breakthrough seizure, last 1 in July 2024 and at that time her Keppra was increased to 500 mg twice daily.  Since then she has been compliant with the medication, denies any additional seizures.  She tells me that no one has ever witnessed the seizure from start to finish so unable to provide description of seizure but she did had tongue biting with her first seizure, and she will definitely lose consciousness.  Denies any other injury from the seizure.   Her chart indicates that she is pregnant but patient tells me she did have a miscarriage in November.   Handedness: Right handed   Onset: April 2023  Seizure Type: presume generalized convulsion  Current frequency: Last seizure July 2024  Any injuries from seizures: Tongue biting   Seizure risk factors: Left frontal cavernoma  Previous ASMs: Levetiracetam   Currenty ASMs: Levetiracetam 500 mg twice daily   ASMs side effects: None   Brain Images: Left frontal cavernoma   Previous EEGs: Left frontal discharges    OTHER MEDICAL  CONDITIONS: Left frontal cavernoma  REVIEW OF SYSTEMS: Full 14 system review of systems performed and negative with exception of: As noted in the HPI  ALLERGIES: No Known Allergies  HOME MEDICATIONS: Outpatient Medications Prior to Visit  Medication Sig Dispense Refill   methocarbamol (ROBAXIN) 500 MG tablet Take 1 tablet (500 mg total) by mouth every 6 (six) hours as needed for muscle spasms. 20 tablet 0   promethazine (PHENERGAN) 25 MG tablet Take 25 mg by mouth every 6 (six) hours as needed for nausea or vomiting.     SUMAtriptan (IMITREX) 50 MG tablet Take 50 mg by mouth once.     tiZANidine (ZANAFLEX) 2 MG tablet Take 2 mg by mouth every 6 (six) hours as needed for muscle spasms.     valACYclovir (VALTREX) 1000 MG tablet Take 1,000 mg by mouth daily.     levETIRAcetam (KEPPRA XR) 500 MG 24 hr tablet Take 500 mg by mouth 2 (two) times daily.     clonazePAM (KLONOPIN) 1 MG tablet Take 2 tablets (2 mg total) by mouth 2 (two) times daily as needed for seizure. (Patient not taking: Reported on 07/16/2023) 20 tablet 0   metoprolol succinate (TOPROL-XL) 25 MG 24 hr tablet Take 25 mg by mouth daily. (Patient not taking: Reported on 02/19/2023)     nadolol (CORGARD) 20 MG tablet Take 20 mg by mouth daily as needed. Pt takes as needed for mirgaines twice weekly only (Patient not taking: Reported on 07/16/2023)  ondansetron (ZOFRAN-ODT) 4 MG disintegrating tablet Take 1 tablet (4 mg total) by mouth every 8 (eight) hours as needed. (Patient not taking: Reported on 07/16/2023) 20 tablet 0   ondansetron (ZOFRAN-ODT) 8 MG disintegrating tablet Take 1 tablet (8 mg total) by mouth every 8 (eight) hours. (Patient not taking: Reported on 02/19/2023) 20 tablet 0   topiramate (TOPAMAX) 25 MG tablet Take 25 mg by mouth daily. (Patient not taking: Reported on 07/16/2023)     No facility-administered medications prior to visit.    PAST MEDICAL HISTORY: Past Medical History:  Diagnosis Date   Bipolar 2  disorder (HCC)    Carpal tunnel syndrome on both sides    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 ;   last MRI in care everywhere 08-25-2021   History of acute pyelonephritis 03/28/2022   admission in epic w/ UTI due to obstructive uropathy due to kidney stone   History of kidney stones    History of syncope    (04-30-2022  pt stated last episode 03-27-2022  in setting kidney stone pain )   syncope w/ collapse with recurrent near syncope/ syncope ;   cardiologsit --- dr Sherlyn Lees note 11-11-2021 work-up results in care everywhere event monitor showed benign w/ low burden PACs/ PVCs & evidence inappropriate ST;  normal ETT and echo   IDA (iron deficiency anemia)    Left ureteral calculus    Migraine    Palpitations    (04-30-2022  pt stated taking toprol daily has helped)   pt is scheduled for loop recorder insertion 05-08-2022 @ [facility]   Seizure-like activity (HCC)    (04-30-2022  pt stated last syncope/ seizure like activity 03-27-2022 in setting kidney stone pain)   neurologist-- dr Bea Laura. Charyl Dancer;   started 04/ 2023 with syncope w/ collapse had seizure like acitivity;   MRI  left frontal cerebral cavenoma in 2011 and last MRI in care everywhere 08-25-2021 same;   EEG 04-16-2022 care everywhere normal without seizure acitivy   Urgency of urination    Wears glasses     PAST SURGICAL HISTORY: Past Surgical History:  Procedure Laterality Date   CYSTOSCOPY W/ URETERAL STENT PLACEMENT Left 03/28/2022   Procedure: CYSTOSCOPY WITH RETROGRADE PYELOGRAM/URETERAL STENT PLACEMENT;  Surgeon: Jannifer Hick, MD;  Location: [facility];  Service: Urology;  Laterality: Left;   CYSTOSCOPY/URETEROSCOPY/HOLMIUM LASER/STENT PLACEMENT Left 05/02/2022   Procedure: CYSTOSCOPY/LEFT URETEROSCOPY/HOLMIUM LASER/LEFT RETROGRADE PYELOGRAM/LEFT STENT PLACEMENT;  Surgeon: Jannifer Hick, MD;  Location: [facility];  Service: Urology;  Laterality: Left;  60 MINUTES NEEDED FOR CASE.   FOOT SURGERY Bilateral 2005    bunion   LAPAROSCOPIC APPENDECTOMY  12/20/2009   @[facility]  by dr b. zeigler    FAMILY HISTORY: Family History  Problem Relation Age of Onset   Hypertension Mother    Diabetes Other    Thyroid disease Other    Heart disease Other    Birth defects Other    Mental illness Other    Stroke Other    Breast cancer Other    Graves' disease Maternal Grandmother    Multiple sclerosis Maternal Grandfather    Suicidality Father     SOCIAL HISTORY: Social History   Socioeconomic History   Marital status: Single    Spouse name: Not on file   Number of children: Not on file   Years of education: Not on file   Highest education level: Not on file  Occupational History   Not on file  Tobacco Use  Smoking status: Never   Smokeless tobacco: Never  Vaping Use   Vaping status: Every Day   Substances: Nicotine   Devices: orion  Substance and Sexual Activity   Alcohol use: Not Currently   Drug use: Never   Sexual activity: Yes    Birth control/protection: Condom  Other Topics Concern   Not on file  Social History Narrative   Right handed   Caffeine-3 cups daily, drinks red bull   Social Drivers of Health   Financial Resource Strain: Medium Risk (09/14/2022)   Received from [facility], [facility]   Overall Financial Resource Strain (CARDIA)    Difficulty of Paying Living Expenses: Somewhat hard  Food Insecurity: No Food Insecurity (11/24/2022)   Hunger Vital Sign    Worried About Running Out of Food in the Last Year: Never true    Ran Out of Food in the Last Year: Never true  Recent Concern: Food Insecurity - Food Insecurity Present (09/14/2022)   Received from [facility], [facility]   Hunger Vital Sign    Worried About Running Out of Food in the Last Year: Sometimes true    Ran Out of Food in the Last Year: Sometimes true  Transportation Needs: No Transportation Needs (11/24/2022)   PRAPARE - Administrator, Civil Service (Medical): No    Lack of  Transportation (Non-Medical): No  Recent Concern: Transportation Needs - Unmet Transportation Needs (09/14/2022)   Received from [facility], [facility]   [facility] - Transportation    Lack of Transportation (Medical): Yes    Lack of Transportation (Non-Medical): Yes  Physical Activity: Insufficiently Active (09/14/2022)   Received from [facility], [facility]   Exercise Vital Sign    Days of Exercise per Week: 5 days    Minutes of Exercise per Session: 10 min  Stress: Stress Concern Present (09/14/2022)   Received from [facility] - Occupational Stress Questionnaire    Feeling of Stress : Very much  Social Connections: Socially Isolated (09/14/2022)   Received from [facility], [facility]   Social Network    How would you rate your social network (family, work, friends)?: Little participation, lonely and socially isolated  Intimate Partner Violence: Not At Risk (11/24/2022)   Humiliation, Afraid, Rape, and Kick questionnaire    Fear of Current or Ex-Partner: No    Emotionally Abused: No    Physically Abused: No    Sexually Abused: No    PHYSICAL EXAM  GENERAL EXAM/CONSTITUTIONAL: Vitals:  Vitals:   07/16/23 0859  BP: 110/70  Weight: 180 lb (81.6 kg)  Height: 5\' 3"  (1.6 m)   Body mass index is 31.89 kg/m. Wt Readings from Last 3 Encounters:  07/16/23 180 lb (81.6 kg)  03/09/23 165 lb (74.8 kg)  02/19/23 165 lb 6.4 oz (75 kg)   Patient is in no distress; well developed, nourished and groomed; neck is supple  MUSCULOSKELETAL: Gait, strength, tone, movements noted in Neurologic exam below  NEUROLOGIC: MENTAL STATUS:      No data to display         awake, alert, oriented to person, place and time recent and remote memory intact normal attention and concentration language fluent, comprehension intact, naming intact fund of knowledge appropriate  CRANIAL NERVE:  2nd, 3rd, 4th, 6th - Visual  fields full to confrontation, extraocular muscles intact, no nystagmus 5th - facial sensation symmetric 7th - facial strength symmetric 8th - hearing intact  9th - palate elevates symmetrically, uvula midline 11th - shoulder shrug symmetric 12th - tongue protrusion midline  MOTOR:  normal bulk and tone, full strength in the BUE, BLE  SENSORY:  normal and symmetric to light touch  COORDINATION:  finger-nose-finger, fine finger movements normal  GAIT/STATION:  normal     DIAGNOSTIC DATA (LABS, IMAGING, TESTING) - I reviewed patient records, labs, notes, testing and imaging myself where available.  Lab Results  Component Value Date   WBC 8.5 03/09/2023   HGB 13.0 03/09/2023   HCT 39.6 03/09/2023   MCV 98.0 03/09/2023   PLT 298 03/09/2023      Component Value Date/Time   NA 136 03/09/2023 1545   K 3.5 03/09/2023 1545   CL 102 03/09/2023 1545   CO2 23 03/09/2023 1545   GLUCOSE 96 03/09/2023 1545   BUN 14 03/09/2023 1545   CREATININE 0.69 03/09/2023 1545   CALCIUM 9.8 03/09/2023 1545   PROT 7.3 03/08/2023 1553   ALBUMIN 4.8 03/08/2023 1553   AST 16 03/08/2023 1553   ALT 15 03/08/2023 1553   ALKPHOS 38 03/08/2023 1553   BILITOT 0.4 03/08/2023 1553   GFRNONAA >60 03/09/2023 1545   GFRAA >60 08/26/2019 1425   Lab Results  Component Value Date   CHOL 168 04/18/2009   HDL 37 (L) 04/18/2009   LDLCALC 122 (H) 04/18/2009   TRIG 44 04/18/2009   No results found for: "HGBA1C" No results found for: "VITAMINB12" Lab Results  Component Value Date   TSH 0.891 04/18/2009   CT head 03/08/2023 1. No evidence of acute intracranial abnormality or facial fracture. 2. 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  EEG 11/25/2022: Normal  LTM 01/12/2023: This is abnormal long term EEG, demonstrating left frontotemporal sharp waves and spikes   I personally reviewed brain Images   ASSESSMENT AND PLAN  33 y.o.  year old female  with history of left frontal cavernoma and epilepsy who is presenting to establish care.  Currently her epilepsy is well-controlled on Keppra extended release 500 mg twice daily, her last seizure was in July 2024, at that time Keppra was increased to 500 mg twice daily.  Plan will be for patient to continue Keppra, will check level and I will see her in 6 months for follow-up.  Advised her to contact me if she does have a seizure or any other concerns.    1. Partial symptomatic epilepsy with complex partial seizures, not intractable, without status epilepticus (HCC)   2. Therapeutic drug monitoring     Patient Instructions  Continue with Keppra extended release 500 mg twice daily, refill given Will check a Keppra level Continue to follow PCP Return in 6 months or sooner if worse. Please contact us if you do have a breakthrough seizure   Per [facility] statutes, patients with seizures are not allowed to drive until they have been seizure-free for six months.  Other recommendations include using caution when using heavy equipment or power tools. Avoid working on ladders or at heights. Take showers instead of baths.  Do not swim alone.  Ensure the water temperature is not too high on the home water heater. Do not go swimming alone. Do not lock yourself in a room alone (i.e. bathroom). When caring for infants or small children, sit down when holding, feeding, or changing them to minimize risk of injury to the child in the event you have a seizure. Maintain good sleep hygiene.  Avoid alcohol.  Also recommend adequate sleep, hydration, good diet and minimize stress.   During the Seizure  - First, ensure adequate ventilation and place patients on the floor on their left side  Loosen clothing around the neck and ensure the airway is patent. If the patient is clenching the teeth, do not force the mouth open with any object as this can cause severe damage - Remove all items from  the surrounding that can be hazardous. The patient may be oblivious to what's happening and may not even know what he or she is doing. If the patient is confused and wandering, either gently guide him/her away and block access to outside areas - Reassure the individual and be comforting - Call 911. In most cases, the seizure ends before EMS arrives. However, there are cases when seizures may last over 3 to 5 minutes. Or the individual may have developed breathing difficulties or severe injuries. If a pregnant patient or a person with diabetes develops a seizure, it is prudent to call an ambulance. - Finally, if the patient does not regain full consciousness, then call EMS. Most patients will remain confused for about 45 to 90 minutes after a seizure, so you must use judgment in calling for help. - Avoid restraints but make sure the patient is in a bed with padded side rails - Place the individual in a lateral position with the neck slightly flexed; this will help the saliva drain from the mouth and prevent the tongue from falling backward - Remove all nearby furniture and other hazards from the area - Provide verbal assurance as the individual is regaining consciousness - Provide the patient with privacy if possible - Call for help and start treatment as ordered by the caregiver   After the Seizure (Postictal Stage)  After a seizure, most patients experience confusion, fatigue, muscle pain and/or a headache. Thus, one should permit the individual to sleep. For the next few days, reassurance is essential. Being calm and helping reorient the person is also of importance.  Most seizures are painless and end spontaneously. Seizures are not harmful to others but can lead to complications such as stress on the lungs, brain and the heart. Individuals with prior lung problems may develop labored breathing and respiratory distress.    Discussed Patients with epilepsy have a small risk of sudden unexpected  death, a condition referred to as sudden unexpected death in epilepsy (SUDEP). SUDEP is defined specifically as the sudden, unexpected, witnessed or unwitnessed, nontraumatic and nondrowning death in patients with epilepsy with or without evidence for a seizure, and excluding documented status epilepticus, in which post mortem examination does not reveal a structural or toxicologic cause for death     Orders Placed This Encounter  Procedures   Levetiracetam level    Meds ordered this encounter  Medications   levETIRAcetam (KEPPRA XR) 500 MG 24 hr tablet    Sig: Take 1 tablet (500 mg total) by mouth 2 (two) times daily.    Dispense:  180 tablet    Refill:  3    Return in about 6 months (around 01/13/2024).    Windell Norfolk, MD 07/16/2023, 9:54 AM  [facility] [address] [place], [place] [address] [phone]

## 2023-07-16 NOTE — Patient Instructions (Signed)
 Continue with Keppra extended release 500 mg twice daily, refill given Will check a Keppra level Continue to follow PCP Return in 6 months or sooner if worse. Please contact us if you do have a breakthrough seizure

## 2023-07-17 LAB — LEVETIRACETAM LEVEL: Levetiracetam Lvl: <2 ug/mL — ABNORMAL LOW (ref 10.0–40.0)

## 2023-07-20 ENCOUNTER — Encounter: Payer: Self-pay | Admitting: Neurology

## 2023-07-20 DIAGNOSIS — Z0289 Encounter for other administrative examinations: Secondary | ICD-10-CM

## 2023-07-20 DIAGNOSIS — Z5181 Encounter for therapeutic drug level monitoring: Secondary | ICD-10-CM

## 2023-07-20 DIAGNOSIS — G40209 Localization-related (focal) (partial) symptomatic epilepsy and epileptic syndromes with complex partial seizures, not intractable, without status epilepticus: Secondary | ICD-10-CM

## 2023-07-21 ENCOUNTER — Telehealth: Payer: Self-pay | Admitting: *Deleted

## 2023-07-21 ENCOUNTER — Telehealth: Payer: Self-pay

## 2023-07-21 NOTE — Telephone Encounter (Signed)
 Left message not able to reach pt. Please see phone note.

## 2023-07-21 NOTE — Telephone Encounter (Signed)
 Patient left DMV paperwork to be filled out. As of yesterday the patient's Keppra level was undetectable. Patient has not met medically recommended requirements for DMV. I returned the paperwork and informed Stanton Kidney that once the patient is in compliance we can complete DMV form. At this time she would not be approved to drive.

## 2023-08-05 ENCOUNTER — Other Ambulatory Visit: Payer: Self-pay | Admitting: Neurology

## 2023-08-05 ENCOUNTER — Encounter: Payer: Self-pay | Admitting: Family Medicine

## 2023-08-05 ENCOUNTER — Ambulatory Visit (INDEPENDENT_AMBULATORY_CARE_PROVIDER_SITE_OTHER): Payer: Medicaid Other | Admitting: Family Medicine

## 2023-08-05 VITALS — BP 132/82 | HR 76 | Ht 63.0 in | Wt 177.0 lb

## 2023-08-05 DIAGNOSIS — Z5181 Encounter for therapeutic drug level monitoring: Secondary | ICD-10-CM

## 2023-08-05 DIAGNOSIS — Z1159 Encounter for screening for other viral diseases: Secondary | ICD-10-CM

## 2023-08-05 DIAGNOSIS — G43109 Migraine with aura, not intractable, without status migrainosus: Secondary | ICD-10-CM | POA: Diagnosis not present

## 2023-08-05 DIAGNOSIS — Z0001 Encounter for general adult medical examination with abnormal findings: Secondary | ICD-10-CM | POA: Diagnosis not present

## 2023-08-05 DIAGNOSIS — F411 Generalized anxiety disorder: Secondary | ICD-10-CM

## 2023-08-05 DIAGNOSIS — Z6831 Body mass index (BMI) 31.0-31.9, adult: Secondary | ICD-10-CM | POA: Diagnosis not present

## 2023-08-05 DIAGNOSIS — Z0000 Encounter for general adult medical examination without abnormal findings: Secondary | ICD-10-CM | POA: Insufficient documentation

## 2023-08-05 DIAGNOSIS — G40209 Localization-related (focal) (partial) symptomatic epilepsy and epileptic syndromes with complex partial seizures, not intractable, without status epilepticus: Secondary | ICD-10-CM

## 2023-08-05 DIAGNOSIS — R55 Syncope and collapse: Secondary | ICD-10-CM | POA: Diagnosis not present

## 2023-08-05 DIAGNOSIS — E66811 Obesity, class 1: Secondary | ICD-10-CM

## 2023-08-05 DIAGNOSIS — Z13220 Encounter for screening for lipoid disorders: Secondary | ICD-10-CM

## 2023-08-05 DIAGNOSIS — E559 Vitamin D deficiency, unspecified: Secondary | ICD-10-CM | POA: Diagnosis not present

## 2023-08-05 NOTE — Assessment & Plan Note (Signed)
 Referral to Psychiatry. Patient will return to office if symptoms worsen or she would like to discuss a medication to manage her symptoms. Denies SI/HI. Medical records requested.

## 2023-08-05 NOTE — Progress Notes (Signed)
 New Patient Office Visit  Subjective    Patient ID: Marie Brown, female    DOB: 02-Jun-1990  Age: 33 y.o. MRN: [id number]  CC:  Chief Complaint  Patient presents with   Annual Exam    HPI Marie Brown presents to establish care. Oriented to practice routines and expectations. Has been seeing PCP regularly as well as OBGYN. PMH include epilepsy and left frontal cavernoma since 2023 followed by Neurology, bipolar 1, depression, and anxiety. She was previously seeing psychiatry for these and on medication but it has been many years.   Cervical CA screening: was normal and approximate date 2024 and was normal Tobacco: vapes STI: declines Vaccines:  UTD    Outpatient Encounter Medications as of 08/05/2023  Medication Sig   levETIRAcetam (KEPPRA XR) 500 MG 24 hr tablet Take 1 tablet (500 mg total) by mouth 2 (two) times daily.   methocarbamol (ROBAXIN) 500 MG tablet Take 1 tablet (500 mg total) by mouth every 6 (six) hours as needed for muscle spasms.   promethazine (PHENERGAN) 25 MG tablet Take 25 mg by mouth every 6 (six) hours as needed for nausea or vomiting.   SUMAtriptan (IMITREX) 50 MG tablet Take 50 mg by mouth once.   valACYclovir (VALTREX) 1000 MG tablet Take 1,000 mg by mouth daily.   [DISCONTINUED] tiZANidine (ZANAFLEX) 2 MG tablet Take 2 mg by mouth every 6 (six) hours as needed for muscle spasms.   nadolol (CORGARD) 20 MG tablet Take 20 mg by mouth daily. (Patient not taking: Reported on 08/05/2023)   [DISCONTINUED] levETIRAcetam (KEPPRA XR) 500 MG 24 hr tablet Take 2 tablets by mouth at bedtime. (Patient not taking: Reported on 08/05/2023)   [DISCONTINUED] topiramate (TOPAMAX) 100 MG tablet Take 100 mg by mouth daily. (Patient not taking: Reported on 08/05/2023)   No facility-administered encounter medications on file as of 08/05/2023.    Past Medical History:  Diagnosis Date   Bipolar 2 disorder (HCC)    Carpal tunnel syndrome on both sides    Cerebral  cavernoma 2011   left frontal ;   last MRI in care everywhere 08-25-2021   History of acute pyelonephritis 03/28/2022   admission in epic w/ UTI due to obstructive uropathy due to kidney stone   History of kidney stones    History of syncope    (04-30-2022  pt stated last episode 03-27-2022  in setting kidney stone pain )   syncope w/ collapse with recurrent near syncope/ syncope ;   cardiologsit --- dr Sherlyn Lees note 11-11-2021 work-up results in care everywhere event monitor showed benign w/ low burden PACs/ PVCs & evidence inappropriate ST;  normal ETT and echo   IDA (iron deficiency anemia)    Left ureteral calculus    Migraine    Palpitations    (04-30-2022  pt stated taking toprol daily has helped)   pt is scheduled for loop recorder insertion 05-08-2022 @ [facility]   Seizure-like activity (HCC)    (04-30-2022  pt stated last syncope/ seizure like activity 03-27-2022 in setting kidney stone pain)   neurologist-- dr Bea Laura. Charyl Dancer;   started 04/ 2023 with syncope w/ collapse had seizure like acitivity;   MRI  left frontal cerebral cavenoma in 2011 and last MRI in care everywhere 08-25-2021 same;   EEG 04-16-2022 care everywhere normal without seizure acitivy   Urgency of urination    Wears glasses     Past Surgical History:  Procedure Laterality Date   CYSTOSCOPY W/ URETERAL  STENT PLACEMENT Left 03/28/2022   Procedure: CYSTOSCOPY WITH RETROGRADE PYELOGRAM/URETERAL STENT PLACEMENT;  Surgeon: Jannifer Hick, MD;  Location: [facility];  Service: Urology;  Laterality: Left;   CYSTOSCOPY/URETEROSCOPY/HOLMIUM LASER/STENT PLACEMENT Left 05/02/2022   Procedure: CYSTOSCOPY/LEFT URETEROSCOPY/HOLMIUM LASER/LEFT RETROGRADE PYELOGRAM/LEFT STENT PLACEMENT;  Surgeon: Jannifer Hick, MD;  [facility]  Service: Urology;  Laterality: Left;  60 MINUTES NEEDED FOR CASE.   FOOT SURGERY Bilateral 2005   bunion   LAPAROSCOPIC APPENDECTOMY  12/20/2009   @[facility]  by dr b. zeigler    Family History   Problem Relation Age of Onset   Hypertension Mother    Diabetes Other    Thyroid disease Other    Heart disease Other    Birth defects Other    Mental illness Other    Stroke Other    Breast cancer Other    Graves' disease Maternal Grandmother    Multiple sclerosis Maternal Grandfather    Suicidality Father     Social History   Socioeconomic History   Marital status: Single    Spouse name: Not on file   Number of children: Not on file   Years of education: Not on file   Highest education level: Not on file  Occupational History   Not on file  Tobacco Use   Smoking status: Never   Smokeless tobacco: Never  Vaping Use   Vaping status: Every Day   Substances: Nicotine   Devices: orion  Substance and Sexual Activity   Alcohol use: Not Currently   Drug use: Never   Sexual activity: Yes    Birth control/protection: Condom  Other Topics Concern   Not on file  Social History Narrative   Right handed   Caffeine-3 cups daily, drinks red bull   Social Drivers of Health   Financial Resource Strain: Medium Risk (09/14/2022)   Received from [facility], [facility]   Overall Financial Resource Strain (CARDIA)    Difficulty of Paying Living Expenses: Somewhat hard  Food Insecurity: No Food Insecurity (11/24/2022)   Hunger Vital Sign    Worried About Running Out of Food in the Last Year: Never true    Ran Out of Food in the Last Year: Never true  Recent Concern: Food Insecurity - Food Insecurity Present (09/14/2022)   Received from [facility], [facility]   Hunger Vital Sign    Worried About Running Out of Food in the Last Year: Sometimes true    Ran Out of Food in the Last Year: Sometimes true  Transportation Needs: No Transportation Needs (11/24/2022)   PRAPARE - Administrator, Civil Service (Medical): No    Lack of Transportation (Non-Medical): No  Recent Concern: Transportation Needs - Unmet Transportation Needs (09/14/2022)   Received from [facility], [facility]   [facility] - Transportation    Lack of Transportation (Medical): Yes    Lack of Transportation (Non-Medical): Yes  Physical Activity: Insufficiently Active (09/14/2022)   Received from [facility], [facility]   Exercise Vital Sign    Days of Exercise per Week: 5 days    Minutes of Exercise per Session: 10 min  Stress: Stress Concern Present (09/14/2022)   Received from [facility] - Occupational Stress Questionnaire    Feeling of Stress : Very much  Social Connections: Socially Isolated (09/14/2022)   Received from [facility], [facility]   Social Network    How would you rate  your social network (family, work, friends)?: Little participation, lonely and socially isolated  Intimate Partner Violence: Not At Risk (11/24/2022)   Humiliation, Afraid, Rape, and Kick questionnaire    Fear of Current or Ex-Partner: No    Emotionally Abused: No    Physically Abused: No    Sexually Abused: No    Review of Systems  Constitutional: Negative.   HENT: Negative.    Eyes: Negative.   Respiratory: Negative.    Cardiovascular: Negative.   Gastrointestinal: Negative.   Genitourinary: Negative.   Musculoskeletal: Negative.   Skin: Negative.   Neurological:  Positive for seizures and headaches.  Endo/Heme/Allergies: Negative.   Psychiatric/Behavioral:  Positive for depression. The patient is nervous/anxious.   All other systems reviewed and are negative.       Objective    BP 132/82   Pulse 76   Ht 5\' 3"  (1.6 m)   Wt 177 lb (80.3 kg)   LMP 07/22/2023   SpO2 99%   Breastfeeding No   BMI 31.35 kg/m   Physical Exam Vitals and nursing note reviewed.  Constitutional:      Appearance: Normal appearance. She is obese.  HENT:     Head: Normocephalic and atraumatic.     Right Ear: Tympanic membrane, ear canal and external ear normal.     Left Ear: Tympanic membrane, ear canal and external ear  normal.     Nose: Nose normal.     Mouth/Throat:     Mouth: Mucous membranes are moist.     Pharynx: Oropharynx is clear.  Eyes:     Extraocular Movements: Extraocular movements intact.     Conjunctiva/sclera: Conjunctivae normal.     Pupils: Pupils are equal, round, and reactive to light.  Cardiovascular:     Rate and Rhythm: Normal rate and regular rhythm.     Pulses: Normal pulses.     Heart sounds: Normal heart sounds.  Pulmonary:     Effort: Pulmonary effort is normal.     Breath sounds: Normal breath sounds.  Abdominal:     General: Bowel sounds are normal.     Palpations: Abdomen is soft.  Musculoskeletal:        General: Normal range of motion.     Cervical back: Normal range of motion and neck supple.  Skin:    General: Skin is warm and dry.     Capillary Refill: Capillary refill takes less than 2 seconds.  Neurological:     General: No focal deficit present.     Mental Status: She is alert and oriented to person, place, and time. Mental status is at baseline.  Psychiatric:        Mood and Affect: Mood is anxious.        Behavior: Behavior normal.        Thought Content: Thought content normal.        Judgment: Judgment normal.         Assessment & Plan:   Problem List Items Addressed This Visit     Migraine with aura and without status migrainosus, not intractable   Currently well controlled on Imitrex and Robaxin PRN.      Relevant Medications   nadolol (CORGARD) 20 MG tablet   Physical exam, annual - Primary   Today your medical history was reviewed and routine physical exam with labs was performed. Recommend 150 minutes of moderate intensity exercise weekly and consuming a well-balanced diet. Advised to stop smoking if a smoker, avoid smoking if a  non-smoker, limit alcohol consumption to 1 drink per day for women and 2 drinks per day for men, and avoid illicit drug use. Counseled on safe sex practices and offered STI testing today. Counseled on the  importance of sunscreen use. Counseled in mental health awareness and when to seek medical care. Vaccine maintenance discussed. Appropriate health maintenance items reviewed. Return to office in 1 year for annual physical exam.       Relevant Orders   CBC with Differential/Platelet   COMPLETE METABOLIC PANEL WITH GFR   Lipid panel   TSH   Hemoglobin A1c   Vitamin B12   VITAMIN D 25 Hydroxy (Vit-D Deficiency, Fractures)   Hepatitis B surface antibody,quantitative   Hepatitis C antibody   Hepatitis B core antibody, IgM   Generalized anxiety disorder   Referral to Psychiatry. Patient will return to office if symptoms worsen or she would like to discuss a medication to manage her symptoms. Denies SI/HI. Medical records requested.       Relevant Orders   TSH   Vitamin B12   VITAMIN D 25 Hydroxy (Vit-D Deficiency, Fractures)   Ambulatory referral to Psychiatry   Other Visit Diagnoses       Need for hepatitis C screening test       Relevant Orders   Hepatitis B surface antibody,quantitative   Hepatitis C antibody   Hepatitis B core antibody, IgM     Screening for lipoid disorders       Relevant Orders   Lipid panel     Obesity (BMI 30.0-34.9)       Relevant Orders   CBC with Differential/Platelet   COMPLETE METABOLIC PANEL WITH GFR   Lipid panel   TSH   Hemoglobin A1c   Vitamin B12   VITAMIN D 25 Hydroxy (Vit-D Deficiency, Fractures)       Return in about 1 year (around 08/04/2024) for annual physical with labs 1 week prior.   Park Meo, FNP

## 2023-08-05 NOTE — Assessment & Plan Note (Signed)
 Currently well controlled on Imitrex and Robaxin PRN.

## 2023-08-05 NOTE — Addendum Note (Signed)
 Addended by: Lenn Cal on: 08/05/2023 04:20 PM   Modules accepted: Orders

## 2023-08-05 NOTE — Assessment & Plan Note (Signed)

## 2023-08-05 NOTE — Patient Instructions (Signed)
 It was great to meet you today and I'm excited to have you join the [facility] practice. I hope you had a positive experience today! If you feel so inclined, please feel free to recommend our practice to friends and family. Kurtis Bushman, FNP-C

## 2023-08-08 LAB — CBC WITH DIFFERENTIAL/PLATELET
Absolute Lymphocytes: 2434 {cells}/uL (ref 850–3900)
Absolute Monocytes: 641 {cells}/uL (ref 200–950)
Basophils Absolute: 18 {cells}/uL (ref 0–200)
Basophils Relative: 0.3 %
Eosinophils Absolute: 12 {cells}/uL — ABNORMAL LOW (ref 15–500)
Eosinophils Relative: 0.2 %
HCT: 38.7 % (ref 35.0–45.0)
Hemoglobin: 13.2 g/dL (ref 11.7–15.5)
MCH: 31.9 pg (ref 27.0–33.0)
MCHC: 34.1 g/dL (ref 32.0–36.0)
MCV: 93.5 fL (ref 80.0–100.0)
MPV: 9.8 fL (ref 7.5–12.5)
Monocytes Relative: 10.5 %
Neutro Abs: 2995 {cells}/uL (ref 1500–7800)
Neutrophils Relative %: 49.1 %
Platelets: 317 10*3/uL (ref 140–400)
RBC: 4.14 10*6/uL (ref 3.80–5.10)
RDW: 11.3 % (ref 11.0–15.0)
Total Lymphocyte: 39.9 %
WBC: 6.1 10*3/uL (ref 3.8–10.8)

## 2023-08-08 LAB — LIPID PANEL
Cholesterol: 227 mg/dL — ABNORMAL HIGH (ref <200)
HDL: 62 mg/dL (ref >=50)
LDL Cholesterol (Calc): 147 mg/dL — ABNORMAL HIGH
Non-HDL Cholesterol (Calc): 165 mg/dL — ABNORMAL HIGH (ref <130)
Total CHOL/HDL Ratio: 3.7 (calc) (ref <5.0)
Triglycerides: 77 mg/dL (ref <150)

## 2023-08-08 LAB — COMPLETE METABOLIC PANEL WITH GFR
AG Ratio: 1.9 (calc) (ref 1.0–2.5)
ALT: 17 U/L (ref 6–29)
AST: 18 U/L (ref 10–30)
Albumin: 5 g/dL (ref 3.6–5.1)
Alkaline phosphatase (APISO): 50 U/L (ref 31–125)
BUN: 13 mg/dL (ref 7–25)
CO2: 27 mmol/L (ref 20–32)
Calcium: 10.1 mg/dL (ref 8.6–10.2)
Chloride: 103 mmol/L (ref 98–110)
Creat: 0.76 mg/dL (ref 0.50–0.97)
Globulin: 2.6 g/dL (ref 1.9–3.7)
Glucose, Bld: 67 mg/dL (ref 65–99)
Potassium: 4.4 mmol/L (ref 3.5–5.3)
Sodium: 139 mmol/L (ref 135–146)
Total Bilirubin: 0.4 mg/dL (ref 0.2–1.2)
Total Protein: 7.6 g/dL (ref 6.1–8.1)

## 2023-08-08 LAB — HEPATITIS C ANTIBODY: Hepatitis C Ab: NONREACTIVE

## 2023-08-08 LAB — TEST AUTHORIZATION

## 2023-08-08 LAB — HEMOGLOBIN A1C
Hgb A1c MFr Bld: 5.2 %{Hb} (ref <5.7)
Mean Plasma Glucose: 103 mg/dL
eAG (mmol/L): 5.7 mmol/L

## 2023-08-08 LAB — TSH: TSH: 0.33 m[IU]/L — ABNORMAL LOW

## 2023-08-08 LAB — VITAMIN D 25 HYDROXY (VIT D DEFICIENCY, FRACTURES): Vit D, 25-Hydroxy: 13 ng/mL — ABNORMAL LOW (ref 30–100)

## 2023-08-08 LAB — HEPATITIS B SURFACE ANTIBODY, QUANTITATIVE: Hep B S AB Quant (Post): >1000 m[IU]/mL (ref >=10)

## 2023-08-08 LAB — VITAMIN B12: Vitamin B-12: 331 pg/mL (ref 200–1100)

## 2023-08-08 LAB — T3, FREE: T3, Free: 2.9 pg/mL (ref 2.3–4.2)

## 2023-08-08 LAB — T4, FREE: Free T4: 1.2 ng/dL (ref 0.8–1.8)

## 2023-08-10 ENCOUNTER — Ambulatory Visit: Payer: Self-pay

## 2023-08-10 ENCOUNTER — Encounter: Payer: Self-pay | Admitting: Family Medicine

## 2023-08-11 DIAGNOSIS — R55 Syncope and collapse: Secondary | ICD-10-CM | POA: Diagnosis not present

## 2023-08-11 DIAGNOSIS — Z95818 Presence of other cardiac implants and grafts: Secondary | ICD-10-CM | POA: Diagnosis not present

## 2023-08-12 ENCOUNTER — Telehealth: Admitting: Physician Assistant

## 2023-08-12 DIAGNOSIS — J069 Acute upper respiratory infection, unspecified: Secondary | ICD-10-CM | POA: Diagnosis not present

## 2023-08-12 NOTE — Progress Notes (Signed)
 I have spent 5 minutes in review of e-visit questionnaire, review and updating patient chart, medical decision making and response to patient.   Piedad Climes, PA-C

## 2023-08-12 NOTE — Progress Notes (Signed)

## 2023-08-14 MED ORDER — BENZONATATE 100 MG PO CAPS
100.0000 mg | ORAL_CAPSULE | Freq: Three times a day (TID) | ORAL | 0 refills | Status: AC
Start: 2023-08-14 — End: 2023-08-19

## 2023-08-14 NOTE — Addendum Note (Signed)
 Addended by: Karrie Meres on: 08/14/2023 03:18 PM   Modules accepted: Orders

## 2023-08-17 ENCOUNTER — Ambulatory Visit: Admitting: Sports Medicine

## 2023-08-17 ENCOUNTER — Encounter: Payer: Self-pay | Admitting: Sports Medicine

## 2023-09-01 ENCOUNTER — Other Ambulatory Visit (INDEPENDENT_AMBULATORY_CARE_PROVIDER_SITE_OTHER): Payer: Self-pay

## 2023-09-01 ENCOUNTER — Encounter: Payer: Self-pay | Admitting: Sports Medicine

## 2023-09-01 ENCOUNTER — Ambulatory Visit: Admitting: Sports Medicine

## 2023-09-01 DIAGNOSIS — M899 Disorder of bone, unspecified: Secondary | ICD-10-CM

## 2023-09-01 DIAGNOSIS — M542 Cervicalgia: Secondary | ICD-10-CM

## 2023-09-01 DIAGNOSIS — R293 Abnormal posture: Secondary | ICD-10-CM

## 2023-09-01 DIAGNOSIS — M62830 Muscle spasm of back: Secondary | ICD-10-CM

## 2023-09-01 MED ORDER — METHOCARBAMOL 500 MG PO TABS: 500.0000 mg | ORAL_TABLET | Freq: Four times a day (QID) | ORAL | 0 refills | Status: DC | PRN

## 2023-09-01 MED ORDER — MELOXICAM 7.5 MG PO TABS: 7.5000 mg | ORAL_TABLET | Freq: Every day | ORAL | 0 refills | Status: DC

## 2023-09-01 NOTE — Progress Notes (Signed)
 Patient says that she has had pain in the top of the shoulders for a few months now. 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 She says that her pain is on the tops of the shoulders and upper back, and sometimes goes up the neck and causes headaches. She says that she does have seizures and migraines and will take medication for that if needed, but does not take anything specifically for this pain. She says that when her shoulders are most bothersome she will get some tingling in the fingers, but otherwise denies pain, numbness, or tingling down the arms. With cervical flexion, extension, and lateral flexion she does feel a stretch through the upper trapezius.   Patient was instructed in 10 minutes of therapeutic exercises for neck and shoulders to improve strength, ROM and function according to my instructions and plan of care by a Certified Athletic Trainer during the office visit. A customized handout was provided and demonstration of proper technique shown and discussed. Patient did perform exercises and demonstrate understanding through teachback.  All questions discussed and answered.

## 2023-09-01 NOTE — Progress Notes (Signed)
 Marie Brown - 33 y.o. female MRN [id number]  Date of birth: 06-Jul-1990  Office Visit Note: Visit Date: 09/01/2023 PCP: Park Meo, FNP Referred by: Park Meo, FNP  Subjective: Chief Complaint  Patient presents with  . Neck - Pain   HPI: Marie Brown is a pleasant 33 y.o. female who presents today for bilateral trapezius/neck/ant shoulder pain.  Marie Brown has had pain in the top of the shoulders, neck/shoulder blade as well as the anterior shoulder for at least a few months to year.  It has progressively gotten worse over the last few weeks to a month.  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  Her pain does go up into the neck and will cause her headaches.  Very occasionally she will have numbness into the fingers when her shoulders are flared up but not consistently.  She does try to do neck stretching on her own.  She has not had any treatment, therapy or medication for this specifically.  She does have a history of seizures and is on Keppra for this, needs to be cautious with NSAIDs.   Pertinent ROS were reviewed with the patient and found to be negative unless otherwise specified above in HPI.   Assessment & Plan: Visit Diagnoses:  1. Spasm of both trapezius muscles   2. Neck pain   3. Scapular dysfunction   4. Abnormal posture    Plan: Impression is chronic trapezius and shoulder/neck pain which is largely somatic dysfunction in nature as well as abnormal postural changes.  Most of her pain originates from her bilateral trapezius hypertonicity as well as her scapular dysfunction.  She has difficulty performing scapular retraction because of muscle and fascial restriction.  I do think she would be an excellent candidate for chiropractic/OMT care.  Will send a referral to Dr. Aleen Sells for OMT for the neck and shoulders.  She also has taken Robaxin in the past which was helpful for muscle  spasms, we will refill this and begin Robaxin 500 mg twice daily as needed.  We did provide her physical therapy exercises she can do at home, printed a customized handout for cervical isometrics and scapular retraction.  My athletic trainer, Isabelle Course did review these with her in the room today.  She will perform these once daily.  I think these will continue to be even more helpful when she receives some benefit from OMT.  Could consider additional chiropractic care in the future as well.  She will follow-up with me after few treatments of OMT/manipulation.   Follow-up: Return for f/u after a few treatments of OMT with Dr. Jean Rosenthal.   Meds & Orders:  Meds ordered this encounter  Medications  . DISCONTD: meloxicam (MOBIC) 7.5 MG tablet    Sig: Take 1 tablet (7.5 mg total) by mouth daily.    Dispense:  30 tablet    Refill:  0  . methocarbamol (ROBAXIN) 500 MG tablet    Sig: Take 1 tablet (500 mg total) by mouth every 6 (six) hours as needed for muscle spasms.    Dispense:  30 tablet    Refill:  0    Orders Placed This Encounter  Procedures  . XR Cervical Spine 2 or 3 views     Procedures: No procedures performed      Clinical History: No specialty comments available.  She reports that she has never smoked. She has  never used smokeless tobacco.  Recent Labs    08/05/23 1507  HGBA1C 5.2    Objective:   Vital Signs: LMP 07/22/2023   Physical Exam  Gen: Well-appearing, in no acute distress; non-toxic CV: Well-perfused. Warm.  Resp: Breathing unlabored on room air; no wheezing. Psych: Fluid speech in conversation; appropriate affect; normal thought process  Ortho Exam - Cervical: No midline spinous process tenderness.  There is mild forward rotation of the cervical spine although painless range of motion.  - Shoulder/Scapula: There is significant bilateral trapezius hypertonicity with forward shoulder protraction.  Patient has difficulty with scapular retraction with a degree of  scapular dysfunction without dyskinesia or winging.  There is somatic dysfunction of the upper thoracic spine.  Imaging: XR Cervical Spine 2 or 3 views Result Date: 09/01/2023 2 views of the cervical spine including AP and lateral of them were ordered and reviewed by myself today.  X-rays demonstrate no scoliosis, there is no significant degenerative disc disease.  No acute fracture or otherwise acute bony abnormality noted.  Past Medical/Family/Surgical/Social History: Medications & Allergies reviewed per EMR, new medications updated. Patient Active Problem List   Diagnosis Date Noted  . Physical exam, annual 08/05/2023  . Generalized anxiety disorder 08/05/2023  . Status post placement of implantable loop recorder 05/08/2022  . Pyohydronephrosis 03/28/2022  . Seizure-like activity (HCC) 03/28/2022  . 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 Syncope 03/11/2022  . Migraine with aura and without status migrainosus, not intractable 02/05/2022  . Palpitations 02/05/2022  . Cavernoma 10/17/2021  . Family history of thyroid disease 10/17/2021  . Nicotine use disorder 10/17/2021  . Closed fracture of lower end of left radius with routine healing 06/26/2021  . Dysmenorrhea 11/09/2012  . ACUTE BRONCHOSPASM 12/12/2009  . NECK PAIN 06/13/2009  . NAUSEA AND VOMITING 05/24/2009  . ACUTE LARYNGITIS, WITHOUT MENTION OF OBSTRUCTIO 05/02/2009  . WEIGHT GAIN 04/22/2009  . HEADACHE 04/22/2009  . UNSPECIFIED VISUAL LOSS 01/17/2009  . ACUTE CYSTITIS 01/17/2009  . Vaginitis and vulvovaginitis, unspecified 01/17/2009  . FATIGUE 01/17/2009  . ACUTE SINUSITIS, UNSPECIFIED 11/08/2008  . ACUTE BRONCHITIS 11/08/2008  . BIPOLAR DISORDER UNSPECIFIED 08/12/2008  . NICOTINE ADDICTION 08/12/2008   Past Medical History:  Diagnosis Date  . Bipolar 2 disorder (HCC)   . Carpal tunnel syndrome on both sides   . 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 ;   last MRI in care everywhere 08-25-2021   . History of acute pyelonephritis 03/28/2022   admission in epic w/ UTI due to obstructive uropathy due to kidney stone  . History of kidney stones   . History of syncope    (04-30-2022  pt stated last episode 03-27-2022  in setting kidney stone pain )   syncope w/ collapse with recurrent near syncope/ syncope ;   cardiologsit --- dr Dillon Frames note 11-11-2021 work-up results in care everywhere event monitor showed benign w/ low burden PACs/ PVCs & evidence inappropriate ST;  normal ETT and echo  . IDA (iron deficiency anemia)   . Left ureteral calculus   . Migraine   . Palpitations    (04-30-2022  pt stated taking toprol daily has helped)   pt is scheduled for loop recorder insertion 05-08-2022 @ [facility]  . Seizure-like activity (HCC)    (04-30-2022  pt stated last syncope/ seizure like activity 03-27-2022 in setting kidney stone pain)   neurologist-- dr Zachery Hermes. Gorge Laud;   started 04/ 2023 with syncope w/ collapse had seizure like acitivity;   MRI  left frontal cerebral cavenoma in 2011 and last MRI in care everywhere 08-25-2021 same;   EEG 04-16-2022 care everywhere normal without seizure acitivy  . Urgency of urination   . Wears glasses    Family History  Problem Relation Age of Onset  . Hypertension Mother   . Diabetes Other   . Thyroid disease Other   . Heart disease Other   . Birth defects Other   . Mental illness Other   . Stroke Other   . Breast cancer Other   . Graves' disease Maternal Grandmother   . Multiple sclerosis Maternal Grandfather   . Suicidality Father    Past Surgical History:  Procedure Laterality Date  . CYSTOSCOPY W/ URETERAL STENT PLACEMENT Left 03/28/2022   Procedure: CYSTOSCOPY WITH RETROGRADE PYELOGRAM/URETERAL STENT PLACEMENT;  Surgeon: Lahoma Pigg, MD;  Location: [facility];  Service: Urology;  Laterality: Left;  . CYSTOSCOPY/URETEROSCOPY/HOLMIUM LASER/STENT PLACEMENT Left 05/02/2022   Procedure: CYSTOSCOPY/LEFT URETEROSCOPY/HOLMIUM LASER/LEFT RETROGRADE  PYELOGRAM/LEFT STENT PLACEMENT;  Surgeon: Lahoma Pigg, MD;  [facility];  Service: Urology;  Laterality: Left;  60 MINUTES NEEDED FOR CASE.  Aaron Aas FOOT SURGERY Bilateral 2005   bunion  . LAPAROSCOPIC APPENDECTOMY  12/20/2009   @[facility]  by dr b. zeigler   Social History   Occupational History  . Not on file  Tobacco Use  . Smoking status: Never  . Smokeless tobacco: Never  Vaping Use  . Vaping status: Every Day  . Substances: Nicotine  . Devices: orion  Substance and Sexual Activity  . Alcohol use: Not Currently  . Drug use: Never  . Sexual activity: Yes    Birth control/protection: Condom

## 2023-09-07 ENCOUNTER — Ambulatory Visit (HOSPITAL_BASED_OUTPATIENT_CLINIC_OR_DEPARTMENT_OTHER): Payer: Self-pay | Admitting: Family

## 2023-09-07 VITALS — BP 113/75 | HR 69 | Wt 185.0 lb

## 2023-09-07 DIAGNOSIS — F411 Generalized anxiety disorder: Secondary | ICD-10-CM | POA: Diagnosis not present

## 2023-09-07 DIAGNOSIS — F3132 Bipolar disorder, current episode depressed, moderate: Secondary | ICD-10-CM | POA: Diagnosis not present

## 2023-09-07 DIAGNOSIS — G479 Sleep disorder, unspecified: Secondary | ICD-10-CM

## 2023-09-07 MED ORDER — TRAZODONE HCL 50 MG PO TABS: 50.0000 mg | ORAL_TABLET | Freq: Every day | ORAL | 0 refills | Status: DC
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
## 2023-09-08 ENCOUNTER — Other Ambulatory Visit (INDEPENDENT_AMBULATORY_CARE_PROVIDER_SITE_OTHER): Payer: Self-pay

## 2023-09-08 ENCOUNTER — Other Ambulatory Visit: Payer: Self-pay

## 2023-09-08 DIAGNOSIS — G40209 Localization-related (focal) (partial) symptomatic epilepsy and epileptic syndromes with complex partial seizures, not intractable, without status epilepticus: Secondary | ICD-10-CM

## 2023-09-08 DIAGNOSIS — Z0289 Encounter for other administrative examinations: Secondary | ICD-10-CM

## 2023-09-08 DIAGNOSIS — Z5181 Encounter for therapeutic drug level monitoring: Secondary | ICD-10-CM | POA: Diagnosis not present

## 2023-09-08 NOTE — Progress Notes (Unsigned)
 error

## 2023-09-09 ENCOUNTER — Encounter: Payer: Self-pay | Admitting: Neurology

## 2023-09-09 ENCOUNTER — Ambulatory Visit: Admitting: Sports Medicine

## 2023-09-09 VITALS — BP 114/60 | HR 78 | Ht 63.0 in | Wt 188.0 lb

## 2023-09-09 DIAGNOSIS — M542 Cervicalgia: Secondary | ICD-10-CM | POA: Diagnosis not present

## 2023-09-09 DIAGNOSIS — M9901 Segmental and somatic dysfunction of cervical region: Secondary | ICD-10-CM

## 2023-09-09 DIAGNOSIS — M546 Pain in thoracic spine: Secondary | ICD-10-CM

## 2023-09-09 DIAGNOSIS — M9905 Segmental and somatic dysfunction of pelvic region: Secondary | ICD-10-CM

## 2023-09-09 DIAGNOSIS — M9908 Segmental and somatic dysfunction of rib cage: Secondary | ICD-10-CM

## 2023-09-09 DIAGNOSIS — G8929 Other chronic pain: Secondary | ICD-10-CM | POA: Diagnosis not present

## 2023-09-09 DIAGNOSIS — M9902 Segmental and somatic dysfunction of thoracic region: Secondary | ICD-10-CM | POA: Diagnosis not present

## 2023-09-09 DIAGNOSIS — M9903 Segmental and somatic dysfunction of lumbar region: Secondary | ICD-10-CM

## 2023-09-09 LAB — LEVETIRACETAM LEVEL: Levetiracetam Lvl: 21.2 ug/mL (ref 10.0–40.0)

## 2023-09-09 NOTE — Progress Notes (Signed)
 Ben Estell Dillinger D.[facility] [address] [place] [address] Phone: [phone]   Assessment and Plan:     1. Neck pain (Primary) 2. Chronic bilateral thoracic back pain 3. Somatic dysfunction of cervical region 4. Somatic dysfunction of thoracic region 5. Somatic dysfunction of lumbar region 6. Somatic dysfunction of pelvic region 7. 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 Vaughn Georges.  Recommend continuing this medication and treatment plan - Start additional HEP focusing on stretching neck musculature to decrease tension headaches - Patient elected for initial OMT today.  Tolerated well per note below. - Decision today to treat with OMT was based on Physical Exam  After verbal consent patient was treated with HVLA (high velocity low amplitude), ME (muscle energy), FPR (flex positional release), ST (soft tissue), PC/PD (Pelvic Compression/ Pelvic Decompression) techniques in cervical, rib, thoracic, lumbar, and pelvic areas. Patient tolerated the procedure well with improvement in symptoms.  Patient educated on potential side effects of soreness and recommended to rest, hydrate, and use Tylenol  as needed for pain control.   15 additional minutes spent for educating Therapeutic Home Exercise Program.  This included exercises focusing on stretching, strengthening, with focus on eccentric aspects.   Long term goals include an improvement in range of motion, strength, endurance as well as avoiding reinjury. Patient's frequency would include in 1-2 times a day, 3-5 times a week for a duration of 6-12 weeks. Proper technique shown and discussed handout in great  detail with ATC.  All questions were discussed and answered.    Pertinent previous records reviewed include orthopedic note 09/01/23  Follow Up:  4 weeks for reevaluation.  If patient had benefit from initial OMT visit, could consider repeat OMT     Subjective:   I, Marie Brown am a scribe for Dr. Cleora Daft.    Chief Complaint: neck and shoulder pain  HPI:   09/09/2023 Patient is a 33 year old female with neck and shoulder pain. Patient states it has been going on for a while now but she pushes through it. The pain wakes her up at night some times. Ice and heating pad aren't working. Just started the meloxicam . 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   Duration? 5 to 6 months Did you have an Injury to cause this pain? no Taking Medication for pain? Robaxin , Meloxicam , Ice and heating pad Numbness or Tingling? yes Does the pain Radiate? fingers Altered gait or use? yes ROM/ impairment of movement? yes   Relevant Historical Information: History of seizure-like activity  Additional pertinent review of systems negative.   Current Outpatient Medications:    levETIRAcetam  (KEPPRA  XR) 500 MG 24 hr tablet, Take 1 tablet (500 mg total) by mouth 2 (two) times daily., Disp: 180 tablet, Rfl: 3   meloxicam  (MOBIC ) 7.5 MG tablet, Take 7.5 mg by mouth daily., Disp: , Rfl:    methocarbamol  (ROBAXIN ) 500 MG tablet, Take 1 tablet (500 mg total) by mouth every 6 (six) hours as needed for muscle spasms., Disp: 30 tablet, Rfl: 0   nadolol (CORGARD) 20 MG tablet, Take 20 mg by mouth daily., Disp: , Rfl:    promethazine  (PHENERGAN ) 25 MG tablet, Take 25 mg by mouth every 6 (  six) hours as needed for nausea or vomiting., Disp: , Rfl:    SUMAtriptan (IMITREX) 50 MG tablet, Take 50 mg by mouth once., Disp: , Rfl:    traZODone  (DESYREL ) 50 MG tablet, Take 1 tablet (50 mg total) by mouth at bedtime., Disp: 30 tablet, Rfl: 0   valACYclovir (VALTREX) 1000 MG tablet, Take 1,000  mg by mouth daily., Disp: , Rfl:    Objective:     Vitals:   09/09/23 0936  BP: 114/60  Pulse: 78  SpO2: 98%  Weight: 188 lb (85.3 kg)  Height: 5\' 3"  (1.6 m)      Body mass index is 33.3 kg/m.    Physical Exam:    General: Well-appearing, cooperative, sitting comfortably in no acute distress.   OMT Physical Exam:  ASIS Compression Test: Positive Right Cervical: TTP paraspinal, C4 RLSR Rib: Bilateral elevated first rib with TTP Thoracic: TTP paraspinal, T4-6 RRSL, T7-9 RLSR Lumbar: TTP paraspinal, L2 RLSL Pelvis: Right anterior innominate    Electronically signed by:  Marshall Skeeter D.[facility] 10:10 AM 09/09/23

## 2023-09-09 NOTE — Patient Instructions (Signed)
 Neck Hep. Recommend using heating pads over areas of pain. Follow up in 4 weeks.

## 2023-09-09 NOTE — Telephone Encounter (Signed)
 Yes, we will complete the DMV paper work

## 2023-09-15 ENCOUNTER — Telehealth: Payer: Self-pay

## 2023-09-15 ENCOUNTER — Telehealth: Payer: Self-pay | Admitting: *Deleted

## 2023-09-15 DIAGNOSIS — Z95818 Presence of other cardiac implants and grafts: Secondary | ICD-10-CM | POA: Diagnosis not present

## 2023-09-15 DIAGNOSIS — R55 Syncope and collapse: Secondary | ICD-10-CM | POA: Diagnosis not present

## 2023-09-15 NOTE — Telephone Encounter (Signed)
 Pt dmv form faxed on 09/15/2023

## 2023-09-15 NOTE — Telephone Encounter (Signed)
 Pt called in to confirm that DMV forms had been received by office. Forms have been received and placed in providers form for completion. Please contact pt when ppw is available to pick. Pt has not paid fees for ppw and will need to [facility] form completion fees when picking up.   Please call [phone] when forms are ready to be picked up.

## 2023-09-18 ENCOUNTER — Other Ambulatory Visit: Payer: Self-pay | Admitting: Sports Medicine

## 2023-09-21 ENCOUNTER — Telehealth (HOSPITAL_BASED_OUTPATIENT_CLINIC_OR_DEPARTMENT_OTHER): Admitting: Family

## 2023-09-21 ENCOUNTER — Encounter: Payer: Self-pay | Admitting: Family Medicine

## 2023-09-21 DIAGNOSIS — F411 Generalized anxiety disorder: Secondary | ICD-10-CM

## 2023-09-21 DIAGNOSIS — G479 Sleep disorder, unspecified: Secondary | ICD-10-CM

## 2023-09-21 DIAGNOSIS — F3132 Bipolar disorder, current episode depressed, moderate: Secondary | ICD-10-CM | POA: Diagnosis not present

## 2023-09-21 MED ORDER — TRAZODONE HCL 100 MG PO TABS
100.0000 mg | ORAL_TABLET | Freq: Every day | ORAL | 0 refills | Status: DC
Start: 2023-09-21 — End: 2023-12-10

## 2023-09-21 NOTE — Addendum Note (Signed)
 Addended by: Levester Reagin on: 09/21/2023 03:47 PM   Modules accepted: Level of Service

## 2023-09-21 NOTE — Progress Notes (Addendum)
 Virtual Visit via Video Note  I connected with Marie Brown on 09/21/23 at  3:30 PM EDT by a video enabled telemedicine application and verified that I am speaking with the correct person using two identifiers.  Location: Patient: Home Provider: Office   I discussed the limitations of evaluation and management by telemedicine and the availability of in person appointments. The patient expressed understanding and agreed to proceed.  I discussed the assessment and treatment plan with the patient. The patient was provided an opportunity to ask questions and all were answered. The patient agreed with the plan and demonstrated an understanding of the instructions.   The patient was advised to call back or seek an in-person evaluation if the symptoms worsen or if the condition fails to improve as anticipated.  I provided 10 minutes of non-face-to-face time during this encounter.   Levester Reagin, NP    [facility] MD/PA/NP OP Progress Note  09/21/2023 3:42 PM Marie Brown  MRN:  [id number]  Chief Complaint: Medication management   HPI: Marie Brown 33 year old African-American female presents for medication management follow-up appointment.  She was initiated on trazodone  50 mg nightly.  States she has been taking Trazdone 50mg   to 100 mg nightly to help with her sleep.  States her sleep has improved.  States she is getting a good nights rest.  Does report some mood irritability but has been manageable.  States she continues to take Keppra  500 mg po bid for mood stabilization.  Declined initiating a another medication at this time as previously discussed.  Reports feeling antsy and some anxiety but overall has been able to manage her symptoms.    Marie Brown reports a good appetite.  States she is resting well throughout the night.  Patient to follow-up 3 months for medication management.  Support encouragement reassurance was provided.  Patient was seen and evaluated via caregility. Her mood appears  congruent.  She is awake, alert and oriented x 3. Patient dosn't appear to be responding to internal or external stimuli.  Denied symptoms related to paranoia or  psychosis. She answered questions appropriate.  Visit Diagnosis:    ICD-10-CM   1. Sleep disturbance  G47.9     2. Bipolar disorder with moderate depression (HCC)  F31.32     3. 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 ;   last MRI in care everywhere 08-25-2021   History of acute pyelonephritis 03/28/2022   admission in epic w/ UTI due to obstructive uropathy due to kidney stone   History of kidney stones    History of syncope    (04-30-2022  pt stated last episode 03-27-2022  in setting kidney stone pain )   syncope w/ collapse with recurrent near syncope/ syncope ;   cardiologsit --- dr Dillon Frames note 11-11-2021 work-up results in care everywhere event monitor showed benign w/ low burden PACs/ PVCs & evidence inappropriate ST;  normal ETT and echo   IDA (iron deficiency anemia)    Left ureteral calculus    Migraine    Palpitations    (04-30-2022  pt stated taking toprol  daily has helped)   pt is scheduled for loop recorder insertion 05-08-2022 @ [facility]   Seizure-like activity (HCC)    (  04-30-2022  pt stated last syncope/ seizure like activity 03-27-2022 in setting kidney stone pain)   neurologist-- dr Zachery Hermes. [place] [facility];   started 04/ 2023 with syncope w/ collapse had seizure like acitivity;   MRI  left frontal cerebral cavenoma in 2011 and last MRI in care everywhere 08-25-2021 same;   EEG 04-16-2022 care everywhere normal without seizure acitivy   Urgency of urination    Wears glasses     Past Surgical History:  Procedure Laterality Date   CYSTOSCOPY W/ URETERAL STENT PLACEMENT Left 03/28/2022   Procedure: CYSTOSCOPY WITH  RETROGRADE PYELOGRAM/URETERAL STENT PLACEMENT;  Surgeon: Lahoma Pigg, MD;  Location: [facility];  Service: Urology;  Laterality: Left;   CYSTOSCOPY/URETEROSCOPY/HOLMIUM LASER/STENT PLACEMENT Left 05/02/2022   Procedure: CYSTOSCOPY/LEFT URETEROSCOPY/HOLMIUM LASER/LEFT RETROGRADE PYELOGRAM/LEFT STENT PLACEMENT;  Surgeon: Lahoma Pigg, MD;  Location: [facility];  Service: Urology;  Laterality: Left;  60 MINUTES NEEDED FOR CASE.   FOOT SURGERY Bilateral 2005   bunion   LAPAROSCOPIC APPENDECTOMY  12/20/2009   @[facility]  by dr b. zeigler    Family Psychiatric History:   Family History:  Family History  Problem Relation Age of Onset   Hypertension Mother    Diabetes Other    Thyroid  disease Other    Heart disease Other    Birth defects Other    Mental illness Other    Stroke Other    Breast cancer Other    Graves' disease Maternal Grandmother    Multiple sclerosis Maternal Grandfather    Suicidality Father     Social History:  Social History   Socioeconomic History   Marital status: Single    Spouse name: Not on file   Number of children: Not on file   Years of education: Not on file   Highest education level: Not on file  Occupational History   Not on file  Tobacco Use   Smoking status: Never   Smokeless tobacco: Never  Vaping Use   Vaping status: Every Day   Substances: Nicotine   Devices: orion  Substance and Sexual Activity   Alcohol use: Not Currently   Drug use: Never   Sexual activity: Yes    Birth control/protection: Condom  Other Topics Concern   Not on file  Social History Narrative   Right handed   Caffeine -3 cups daily, drinks red bull   Social Drivers of Health   Financial Resource Strain: Medium Risk (09/14/2022)   Received from [facility], [facility]   Overall Financial Resource Strain (CARDIA)    Difficulty of Paying Living Expenses: Somewhat hard  Food Insecurity: No Food Insecurity (11/24/2022)   Hunger Vital Sign    Worried  About Running Out of Food in the Last Year: Never true    Ran Out of Food in the Last Year: Never true  Recent Concern: Food Insecurity - Food Insecurity Present (09/14/2022)   Received from [facility], [facility]   Hunger Vital Sign    Worried About Running Out of Food in the Last Year: Sometimes true    Ran Out of Food in the Last Year: Sometimes true  Transportation Needs: No Transportation Needs (11/24/2022)   PRAPARE - Administrator, Civil Service (Medical): No    Lack of Transportation (Non-Medical): No  Recent Concern: Transportation Needs - Unmet Transportation Needs (09/14/2022)   Received from [facility], [facility]   [facility] - Transportation    Lack of Transportation (Medical): Yes    Lack of Transportation (Non-Medical):  Yes  Physical Activity: Insufficiently Active (09/14/2022)   Received from [facility], [facility]   Exercise Vital Sign    Days of Exercise per Week: 5 days    Minutes of Exercise per Session: 10 min  Stress: Stress Concern Present (09/14/2022)   Received from [facility], [facility] - Occupational Stress Questionnaire    Feeling of Stress : Very much  Social Connections: Socially Isolated (09/14/2022)   Received from [facility], [facility]   Social Network    How would you rate your social network (family, work, friends)?: Little participation, lonely and socially isolated    Allergies: No Known Allergies  Metabolic Disorder Labs: Lab Results  Component Value Date   HGBA1C 5.2 08/05/2023   MPG 103 08/05/2023   No results found for: "PROLACTIN" Lab Results  Component Value Date   CHOL 227 (H) 08/05/2023   TRIG 77 08/05/2023   HDL 62 08/05/2023   CHOLHDL 3.7 08/05/2023   VLDL 9 04/18/2009   LDLCALC 147 (H) 08/05/2023   LDLCALC 122 (H) 04/18/2009   Lab Results  Component Value Date   TSH 0.33 (L) 08/05/2023   TSH 0.891 04/18/2009    Therapeutic Level  Labs: No results found for: "LITHIUM" No results found for: "VALPROATE" No results found for: "CBMZ"  Current Medications: Current Outpatient Medications  Medication Sig Dispense Refill   levETIRAcetam  (KEPPRA  XR) 500 MG 24 hr tablet Take 1 tablet (500 mg total) by mouth 2 (two) times daily. 180 tablet 3   meloxicam  (MOBIC ) 7.5 MG tablet Take 7.5 mg by mouth daily.     methocarbamol  (ROBAXIN ) 500 MG tablet TAKE 1 TABLET(500 MG) BY MOUTH EVERY 6 HOURS AS NEEDED FOR MUSCLE SPASMS 30 tablet 0   nadolol (CORGARD) 20 MG tablet Take 20 mg by mouth daily.     promethazine  (PHENERGAN ) 25 MG tablet Take 25 mg by mouth every 6 (six) hours as needed for nausea or vomiting.     SUMAtriptan (IMITREX) 50 MG tablet Take 50 mg by mouth once.     traZODone  (DESYREL ) 100 MG tablet Take 1 tablet (100 mg total) by mouth at bedtime. 60 tablet 0   valACYclovir (VALTREX) 1000 MG tablet Take 1,000 mg by mouth daily.     No current facility-administered medications for this visit.     Musculoskeletal: Virtual assessment  Psychiatric Specialty Exam: Review of Systems  Psychiatric/Behavioral:  Negative for sleep disturbance (improving). The patient is nervous/anxious.   All other systems reviewed and are negative.   There were no vitals taken for this visit.There is no height or weight on file to calculate BMI.  General Appearance: Casual  Eye Contact:  Good  Speech:  Clear and Coherent  Volume:  Normal  Mood:  Anxious and Depressed  Affect:  Congruent  Thought Process:  Coherent  Orientation:  Full (Time, Place, and Person)  Thought Content: Logical   Suicidal Thoughts:  No  Homicidal Thoughts:  No  Memory:  Immediate;   Good Recent;   Good  Judgement:  Good  Insight:  Good  Psychomotor Activity:  Normal  Concentration:  Concentration: Good  Recall:  Good  Fund of Knowledge: Good  Language: Good  Akathisia:  No  Handed:  Right  AIMS (if indicated): done  Assets:  Communication  Skills Desire for Improvement  ADL's:  Intact  Cognition: WNL  Sleep:  Good with medications   Screenings: GAD-7    Flowsheet Row  Office Visit from 08/05/2023 in [facility]  Total GAD-7 Score 19      PHQ2-9    Flowsheet Row Office Visit from 09/07/2023 in [facility] Office Visit from 08/05/2023 in [facility] Office Visit from 06/17/2017 in [facility]  PHQ-2 Total Score 5 4 0  PHQ-9 Total Score 20 14 --      Flowsheet Row ED from 03/09/2023 in [facility] at [facility] ED from 03/08/2023 in [facility] Emergency Department at [facility] ED to Hosp-Admission (Discharged) from 11/23/2022 in [facility]  C-SSRS RISK CATEGORY No Risk No Risk No Risk        Assessment and Plan: Zeta Schaum presents for medication management follow-up appointment.  Currently she is prescribed trazodone  50 mg p.o. nightly.  States she has been taking 100 mg nightly which has been helping with her sleep.  She reports feeling slightly anxious with increased anxiety.  Denied any other stressors at this visit.  Since she has been taking and tolerating her medications well.  Initially discussed consideration for initiating mood stabilization medication coupled with Keppra  however states she is doing well overall since she has been getting good nights rest.  Patient to follow-up 3 months for medication management  Collaboration of Care: Collaboration of Care: Medication Management AEB continue trazodone  100 mg nightly  Patient/Guardian was advised Release of Information must be obtained prior to any record release in order to collaborate their care with an outside provider. Patient/Guardian was advised if they have not already done so to contact the registration department to sign all necessary forms in order for us  to release information regarding  their care.   Consent: Patient/Guardian gives verbal consent for treatment and assignment of benefits for services provided during this visit. Patient/Guardian expressed understanding and agreed to proceed.    Levester Reagin, NP 09/21/2023, 3:42 PM

## 2023-10-06 ENCOUNTER — Other Ambulatory Visit: Payer: Self-pay | Admitting: Sports Medicine

## 2023-10-09 NOTE — Progress Notes (Unsigned)
    Ben Jackson D.[facility] [address] [place] [address] Phone: [phone]   Assessment and Plan:     There are no diagnoses linked to this encounter.  ***   Pertinent previous records reviewed include ***    Follow Up: ***     Subjective:   I, Marie Brown, am serving as a Neurosurgeon for Doctor Ulysees Gander  Chief Complaint: neck and shoulder pain   HPI:    09/09/2023 Patient is a 33 year old female with neck and shoulder pain. Patient states it has been going on for a while now but she pushes through it. The pain wakes her up at night some times. Ice and heating pad aren't working. Just started the meloxicam . 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    Duration? 5 to 6 months Did you have an Injury to cause this pain? no Taking Medication for pain? Robaxin , Meloxicam , Ice and heating pad Numbness or Tingling? yes Does the pain Radiate? fingers Altered gait or use? yes ROM/ impairment of movement? yes   10/13/2023 Patient states   Relevant Historical Information: History of seizure-like activity  Additional pertinent review of systems negative.   Current Outpatient Medications:    levETIRAcetam  (KEPPRA  XR) 500 MG 24 hr tablet, Take 1 tablet (500 mg total) by mouth 2 (two) times daily., Disp: 180 tablet, Rfl: 3   meloxicam  (MOBIC ) 7.5 MG tablet, TAKE 1 TABLET(7.5 MG) BY MOUTH DAILY, Disp: 30 tablet, Rfl: 0   methocarbamol  (ROBAXIN ) 500 MG tablet, TAKE 1 TABLET(500 MG) BY MOUTH EVERY 6 HOURS AS NEEDED FOR MUSCLE SPASMS, Disp: 30 tablet, Rfl: 0   nadolol (CORGARD) 20 MG tablet, Take 20 mg by mouth daily., Disp: , Rfl:    promethazine  (PHENERGAN ) 25 MG tablet, Take 25 mg by mouth every 6 (six) hours as needed for nausea or vomiting., Disp: , Rfl:    SUMAtriptan (IMITREX) 50 MG tablet, Take 50 mg by mouth once., Disp: , Rfl:    traZODone  (DESYREL ) 100 MG tablet, Take 1 tablet (100 mg total) by  mouth at bedtime., Disp: 60 tablet, Rfl: 0   valACYclovir (VALTREX) 1000 MG tablet, Take 1,000 mg by mouth daily., Disp: , Rfl:    Objective:     There were no vitals filed for this visit.    There is no height or weight on file to calculate BMI.    Physical Exam:    ***   Electronically signed by:  Marshall Skeeter D.[facility] 7:40 AM 10/09/23

## 2023-10-13 ENCOUNTER — Ambulatory Visit (INDEPENDENT_AMBULATORY_CARE_PROVIDER_SITE_OTHER): Admitting: Sports Medicine

## 2023-10-13 VITALS — BP 110/70 | HR 76 | Ht 63.0 in

## 2023-10-13 DIAGNOSIS — M9903 Segmental and somatic dysfunction of lumbar region: Secondary | ICD-10-CM | POA: Diagnosis not present

## 2023-10-13 DIAGNOSIS — M542 Cervicalgia: Secondary | ICD-10-CM | POA: Diagnosis not present

## 2023-10-13 DIAGNOSIS — M9908 Segmental and somatic dysfunction of rib cage: Secondary | ICD-10-CM

## 2023-10-13 DIAGNOSIS — G8929 Other chronic pain: Secondary | ICD-10-CM | POA: Diagnosis not present

## 2023-10-13 DIAGNOSIS — M9905 Segmental and somatic dysfunction of pelvic region: Secondary | ICD-10-CM

## 2023-10-13 DIAGNOSIS — M9901 Segmental and somatic dysfunction of cervical region: Secondary | ICD-10-CM

## 2023-10-13 DIAGNOSIS — M546 Pain in thoracic spine: Secondary | ICD-10-CM

## 2023-10-13 DIAGNOSIS — M9902 Segmental and somatic dysfunction of thoracic region: Secondary | ICD-10-CM

## 2023-10-13 NOTE — Patient Instructions (Addendum)
 Physical therapy referral. Follow up in 4 weeks.

## 2023-10-15 ENCOUNTER — Other Ambulatory Visit (HOSPITAL_COMMUNITY): Payer: Self-pay | Admitting: Family

## 2023-10-20 DIAGNOSIS — R55 Syncope and collapse: Secondary | ICD-10-CM | POA: Diagnosis not present

## 2023-10-20 DIAGNOSIS — Z95818 Presence of other cardiac implants and grafts: Secondary | ICD-10-CM | POA: Diagnosis not present

## 2023-11-03 ENCOUNTER — Ambulatory Visit

## 2023-11-04 ENCOUNTER — Ambulatory Visit: Admitting: Family Medicine

## 2023-11-06 ENCOUNTER — Ambulatory Visit: Attending: Sports Medicine

## 2023-11-06 ENCOUNTER — Other Ambulatory Visit: Payer: Self-pay

## 2023-11-06 DIAGNOSIS — M9908 Segmental and somatic dysfunction of rib cage: Secondary | ICD-10-CM | POA: Insufficient documentation

## 2023-11-06 DIAGNOSIS — M9903 Segmental and somatic dysfunction of lumbar region: Secondary | ICD-10-CM | POA: Insufficient documentation

## 2023-11-06 DIAGNOSIS — M9901 Segmental and somatic dysfunction of cervical region: Secondary | ICD-10-CM | POA: Diagnosis not present

## 2023-11-06 DIAGNOSIS — M9902 Segmental and somatic dysfunction of thoracic region: Secondary | ICD-10-CM | POA: Insufficient documentation

## 2023-11-06 DIAGNOSIS — G8929 Other chronic pain: Secondary | ICD-10-CM | POA: Diagnosis not present

## 2023-11-06 DIAGNOSIS — R252 Cramp and spasm: Secondary | ICD-10-CM | POA: Insufficient documentation

## 2023-11-06 DIAGNOSIS — M9905 Segmental and somatic dysfunction of pelvic region: Secondary | ICD-10-CM | POA: Diagnosis not present

## 2023-11-06 DIAGNOSIS — R102 Pelvic and perineal pain: Secondary | ICD-10-CM | POA: Diagnosis present

## 2023-11-06 DIAGNOSIS — R0781 Pleurodynia: Secondary | ICD-10-CM | POA: Insufficient documentation

## 2023-11-06 DIAGNOSIS — M6281 Muscle weakness (generalized): Secondary | ICD-10-CM | POA: Insufficient documentation

## 2023-11-06 DIAGNOSIS — R293 Abnormal posture: Secondary | ICD-10-CM | POA: Insufficient documentation

## 2023-11-06 DIAGNOSIS — M546 Pain in thoracic spine: Secondary | ICD-10-CM | POA: Insufficient documentation

## 2023-11-06 DIAGNOSIS — M542 Cervicalgia: Secondary | ICD-10-CM | POA: Diagnosis present
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
## 2023-11-10 NOTE — Progress Notes (Signed)
 Ben Aizlyn Schifano D.CLEMENTEEN [name] [address] [place] [address] Phone: [phone]   Assessment and Plan:     1. Neck pain 2. Somatic dysfunction of cervical region 3. Somatic dysfunction of thoracic region 4. Chronic bilateral thoracic back pain 5. Somatic dysfunction of lumbar region 6. Somatic dysfunction of pelvic region 7. 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  Neck pain and bilateral trapezius are particularly flared today - Continue HEP and physical therapy - May continue using meloxicam  and methocarbamol  as needed for pain relief and muscle spasms - Patient has received relief with OMT in the past.  Elects for repeat OMT today.  Tolerated well per note below. - Decision today to treat with OMT was based on Physical Exam  After verbal consent patient was treated with HVLA (high velocity low amplitude), ME (muscle energy), FPR (flex positional release), ST (soft tissue), PC/PD (Pelvic Compression/ Pelvic Decompression) techniques in cervical, rib, thoracic, lumbar, and pelvic areas. Patient tolerated the procedure well with improvement in symptoms.  Patient educated on potential side effects of soreness and recommended to rest, hydrate, and use Tylenol  as needed for pain control.   Pertinent previous records reviewed include none  Follow Up: 4 weeks for reevaluation.  Could consider repeat OMT   Subjective:   I, [name], am serving as a Neurosurgeon for Doctor Morene Mace   Chief Complaint: neck and shoulder pain   HPI:    09/09/2023 Patient is a 33 year old female with neck and shoulder pain. Patient states it has been going on for a while now but she pushes through it. The pain wakes her up at night some times. Ice and heating pad aren't working. Just started the meloxicam . 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    Duration? 5 to 6 months Did you have an Injury to cause this pain? no Taking Medication for pain? Robaxin , Meloxicam , Ice and heating pad Numbness or Tingling? yes Does the pain Radiate? fingers Altered gait or use? yes ROM/ impairment of movement? yes   10/13/2023 Patient states shoulder has still been bothering here. Feels like she can't take the meloxicam  due to swelling in the legs, nasuea, and headaches. The tightness is not bad but still there. Been stretching a lot as well. Wants to know if it can cause poor blood circulation. Some times has numbness in fingers and sometimes wakes up with them numb.  11/16/2023 Patient states she is so-so intermittent flares of shoulder   Relevant Historical Information: History of seizure-like activity    Additional pertinent review of systems negative.   Current Outpatient Medications:    levETIRAcetam  (KEPPRA  XR) 500 MG 24 hr tablet, Take 1 tablet (500 mg total) by mouth 2 (two) times daily., Disp: 180 tablet, Rfl: 3   meloxicam  (MOBIC ) 7.5 MG tablet, TAKE 1 TABLET(7.5 MG) BY MOUTH DAILY, Disp: 30 tablet, Rfl: 0   methocarbamol  (ROBAXIN ) 500 MG tablet, TAKE 1 TABLET(500 MG) BY MOUTH EVERY 6 HOURS AS NEEDED FOR MUSCLE SPASMS, Disp: 30 tablet, Rfl: 0   nadolol (CORGARD) 20 MG tablet, Take 20 mg by mouth daily., Disp: , Rfl:    promethazine  (PHENERGAN ) 25 MG tablet, Take 25 mg by mouth every 6 (six) hours as needed for nausea or vomiting., Disp: , Rfl:    SUMAtriptan (IMITREX) 50  MG tablet, Take 50 mg by mouth once., Disp: , Rfl:    traZODone  (DESYREL ) 100 MG tablet, Take 1 tablet (100 mg total) by mouth at bedtime., Disp: 60 tablet, Rfl: 0   valACYclovir (VALTREX) 1000 MG tablet, Take 1,000 mg by mouth daily., Disp: , Rfl:    Objective:     Vitals:   11/16/23 1316  BP: 120/80  Pulse: 89  SpO2: 100%  Weight: 189 lb (85.7 kg)  Height: 5' 3 (1.6 m)      Body mass index is  33.48 kg/m.    Physical Exam:    General: Well-appearing, cooperative, sitting comfortably in no acute distress.   OMT Physical Exam:  ASIS Compression Test: Positive Right Cervical: TTP paraspinal, C4 RRSL SR Rib: Bilateral elevated first rib with TTP Thoracic: TTP paraspinal, T6 RRSR Lumbar: TTP paraspinal, L1 RRSL Pelvis: Right anterior innominate    Electronically signed by:  Odis Mace D.[name] 2:13 PM 11/16/23

## 2023-11-12 ENCOUNTER — Encounter: Payer: Self-pay | Admitting: Physical Therapy

## 2023-11-12 ENCOUNTER — Telehealth: Payer: Self-pay | Admitting: Physical Therapy

## 2023-11-12 NOTE — Telephone Encounter (Signed)
 Called patient about missed appointment today. She got appointment off the wait list but she thought it was for tomorrow.    Kristeen Sar, PT 11/12/23 10:45 AM

## 2023-11-16 ENCOUNTER — Other Ambulatory Visit: Payer: Self-pay

## 2023-11-16 ENCOUNTER — Ambulatory Visit: Admitting: Obstetrics & Gynecology

## 2023-11-16 ENCOUNTER — Ambulatory Visit (INDEPENDENT_AMBULATORY_CARE_PROVIDER_SITE_OTHER): Admitting: Sports Medicine

## 2023-11-16 ENCOUNTER — Other Ambulatory Visit (HOSPITAL_COMMUNITY)
Admission: RE | Admit: 2023-11-16 | Discharge: 2023-11-16 | Disposition: A | Discharge status: Home / Self Care | Source: Ambulatory Visit | Attending: Family Medicine | Admitting: Family Medicine

## 2023-11-16 ENCOUNTER — Encounter: Payer: Self-pay | Admitting: Obstetrics & Gynecology

## 2023-11-16 VITALS — BP 128/55 | HR 75 | Wt 187.2 lb

## 2023-11-16 VITALS — BP 120/80 | HR 89 | Ht 63.0 in | Wt 189.0 lb

## 2023-11-16 DIAGNOSIS — M9905 Segmental and somatic dysfunction of pelvic region: Secondary | ICD-10-CM

## 2023-11-16 DIAGNOSIS — M546 Pain in thoracic spine: Secondary | ICD-10-CM | POA: Diagnosis not present

## 2023-11-16 DIAGNOSIS — M9903 Segmental and somatic dysfunction of lumbar region: Secondary | ICD-10-CM

## 2023-11-16 DIAGNOSIS — M9901 Segmental and somatic dysfunction of cervical region: Secondary | ICD-10-CM

## 2023-11-16 DIAGNOSIS — Z1151 Encounter for screening for human papillomavirus (HPV): Secondary | ICD-10-CM | POA: Diagnosis not present

## 2023-11-16 DIAGNOSIS — Z01419 Encounter for gynecological examination (general) (routine) without abnormal findings: Secondary | ICD-10-CM

## 2023-11-16 DIAGNOSIS — M9902 Segmental and somatic dysfunction of thoracic region: Secondary | ICD-10-CM | POA: Diagnosis not present

## 2023-11-16 DIAGNOSIS — G8929 Other chronic pain: Secondary | ICD-10-CM

## 2023-11-16 DIAGNOSIS — M9908 Segmental and somatic dysfunction of rib cage: Secondary | ICD-10-CM | POA: Diagnosis not present

## 2023-11-16 DIAGNOSIS — M542 Cervicalgia: Secondary | ICD-10-CM

## 2023-11-16 NOTE — Progress Notes (Signed)
 GYNECOLOGY ANNUAL PREVENTATIVE CARE ENCOUNTER NOTE  Subjective:   Marie Brown is a 33 y.o. G25P0020 female here for a routine annual gynecologic exam.  Current complaints: pelvic pressure, receiving pelvic floor PT.   Denies abnormal vaginal bleeding, discharge, pelvic pain, problems with intercourse or other gynecologic concerns.    Gynecologic History No LMP recorded. Contraception: abstinence Last Pap: 2019. Results were: normal   Obstetric History OB History  Gravida Para Term Preterm AB Living  2 0 0 0 2 0  SAB IAB Ectopic Multiple Live Births  2 0 0 0     # Outcome Date GA Lbr Len/2nd Weight Sex Type Anes PTL Lv  2 SAB 04/08/23 [redacted]w[redacted]d         1 SAB 2022            Past Medical History:  Diagnosis Date   Bipolar 2 disorder (HCC)    Carpal tunnel syndrome on both sides    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 ;   last MRI in care everywhere 08-25-2021   History of acute pyelonephritis 03/28/2022   admission in epic w/ UTI due to obstructive uropathy due to kidney stone   History of kidney stones    History of syncope    (04-30-2022  pt stated last episode 03-27-2022  in setting kidney stone pain )   syncope w/ collapse with recurrent near syncope/ syncope ;   cardiologsit --- dr uvaldo ronco note 11-11-2021 work-up results in care everywhere event monitor showed benign w/ low burden PACs/ PVCs & evidence inappropriate ST;  normal ETT and echo   IDA (iron deficiency anemia)    Left ureteral calculus    Migraine    Palpitations    (04-30-2022  pt stated taking toprol  daily has helped)   pt is scheduled for loop recorder insertion 05-08-2022 @ [facility]   Seizure-like activity (HCC)    (04-30-2022  pt stated last syncope/ seizure like activity 03-27-2022 in setting kidney stone pain)   neurologist-- dr forbes. talitha;   started 04/ 2023 with syncope w/ collapse had seizure like acitivity;   MRI  left frontal cerebral cavenoma in 2011 and last MRI in care everywhere 08-25-2021 same;    EEG 04-16-2022 care everywhere normal without seizure acitivy   Urgency of urination    Wears glasses     Past Surgical History:  Procedure Laterality Date   CYSTOSCOPY W/ URETERAL STENT PLACEMENT Left 03/28/2022   Procedure: CYSTOSCOPY WITH RETROGRADE PYELOGRAM/URETERAL STENT PLACEMENT;  Surgeon: Selma Donnice SAUNDERS, MD;  Location: [facility];  Service: Urology;  Laterality: Left;   CYSTOSCOPY/URETEROSCOPY/HOLMIUM LASER/STENT PLACEMENT Left 05/02/2022   Procedure: CYSTOSCOPY/LEFT URETEROSCOPY/HOLMIUM LASER/LEFT RETROGRADE PYELOGRAM/LEFT STENT PLACEMENT;  Surgeon: Selma Donnice SAUNDERS, MD;  Location: [facility];  Service: Urology;  Laterality: Left;  60 MINUTES NEEDED FOR CASE.   FOOT SURGERY Bilateral 2005   bunion   LAPAROSCOPIC APPENDECTOMY  12/20/2009   @[facility]  by dr b. zeigler    Current Outpatient Medications on File Prior to Visit  Medication Sig Dispense Refill   levETIRAcetam  (KEPPRA  XR) 500 MG 24 hr tablet Take 1 tablet (500 mg total) by mouth 2 (two) times daily. 180 tablet 3   methocarbamol  (ROBAXIN ) 500 MG tablet TAKE 1 TABLET(500 MG) BY MOUTH EVERY 6 HOURS AS NEEDED FOR MUSCLE SPASMS 30 tablet 0   nadolol (CORGARD) 20 MG tablet Take 20 mg by mouth daily.     promethazine  (PHENERGAN ) 25 MG tablet Take 25 mg by  mouth every 6 (six) hours as needed for nausea or vomiting.     SUMAtriptan (IMITREX) 50 MG tablet Take 50 mg by mouth once.     traZODone  (DESYREL ) 100 MG tablet Take 1 tablet (100 mg total) by mouth at bedtime. 60 tablet 0   valACYclovir (VALTREX) 1000 MG tablet Take 1,000 mg by mouth daily.     No current facility-administered medications on file prior to visit.    No Known Allergies  Social History   Socioeconomic History   Marital status: Single    Spouse name: Not on file   Number of children: Not on file   Years of education: Not on file   Highest education level: Not on file  Occupational History   Not on file  Tobacco Use   Smoking status: Never    Smokeless tobacco: Never  Vaping Use   Vaping status: Every Day   Substances: Nicotine   Devices: orion  Substance and Sexual Activity   Alcohol use: Not Currently   Drug use: Never   Sexual activity: Yes    Birth control/protection: Condom  Other Topics Concern   Not on file  Social History Narrative   Right handed   Caffeine -3 cups daily, drinks red bull   Social Drivers of Health   Financial Resource Strain: Medium Risk (09/14/2022)   Received from [facility]   Overall Financial Resource Strain (CARDIA)    Difficulty of Paying Living Expenses: Somewhat hard  Food Insecurity: No Food Insecurity (11/24/2022)   Hunger Vital Sign    Worried About Running Out of Food in the Last Year: Never true    Ran Out of Food in the Last Year: Never true  Recent Concern: Food Insecurity - Food Insecurity Present (09/14/2022)   Received from [facility]   Hunger Vital Sign    Within the past 12 months, you worried that your food would run out before you got the money to buy more.: Sometimes true    Within the past 12 months, the food you bought just didn't last and you didn't have money to get more.: Sometimes true  Transportation Needs: No Transportation Needs (11/24/2022)   PRAPARE - Administrator, Civil Service (Medical): No    Lack of Transportation (Non-Medical): No  Recent Concern: Transportation Needs - Unmet Transportation Needs (09/14/2022)   Received from [facility] - Transportation    Lack of Transportation (Medical): Yes    Lack of Transportation (Non-Medical): Yes  Physical Activity: Insufficiently Active (09/14/2022)   Received from [facility]   Exercise Vital Sign    On average, how many days per week do you engage in moderate to strenuous exercise (like a brisk walk)?: 5 days    On average, how many minutes do you engage in exercise at this level?: 10 min  Stress: Stress Concern Present (09/14/2022)   Received from [facility] of Occupational Health - Occupational Stress Questionnaire    Feeling of Stress : Very much  Social Connections: Socially Isolated (09/14/2022)   Received from [facility]   Social Network    How would you rate your social network (family, work, friends)?: Little participation, lonely and socially isolated  Intimate Partner Violence: Not At Risk (11/24/2022)   Humiliation, Afraid, Rape, and Kick questionnaire    Fear of Current or Ex-Partner: No    Emotionally Abused: No    Physically Abused: No    Sexually Abused: No  Family History  Problem Relation Age of Onset   Hypertension Mother    Diabetes Other    Thyroid  disease Other    Heart disease Other    Birth defects Other    Mental illness Other    Stroke Other    Breast cancer Other    Graves' disease Maternal Grandmother    Multiple sclerosis Maternal Grandfather    Suicidality Father     The following portions of the patient's history were reviewed and updated as appropriate: allergies, current medications, past family history, past medical history, past social history, past surgical history and problem list.  Review of Systems Genitourinary:positive for pelvic pressure   Objective:  BP (!) 128/55   Pulse 75   Wt 187 lb 3.2 oz (84.9 kg)   BMI 33.16 kg/m  CONSTITUTIONAL: Well-developed, well-nourished female in no acute distress.  HENT:  Normocephalic, atraumatic, External right and left ear normal. Oropharynx is clear and moist EYES: Conjunctivae and EOM are normal. Pupils are equal, round. No scleral icterus.  NECK: Normal range of motion, supple, no masses.  Normal thyroid .  SKIN: Skin is warm and dry. No rash noted. Not diaphoretic. No erythema. No pallor. NEUROLGIC: Alert and oriented to person, place, and time. Normal  muscle tone coordination. No cranial nerve deficit noted. PSYCHIATRIC: Normal mood and affect. Normal behavior. Normal judgment and thought content. CARDIOVASCULAR: Normal heart rate  noted, regular rhythm RESPIRATORY: Effort normal, no problems with respiration noted. ABDOMEN: Soft, normal bowel sounds, no distention noted.  No tenderness, rebound or guarding.  PELVIC: Normal appearing external genitalia; normal appearing vaginal mucosa and cervix.  No abnormal discharge noted.  Pap smear obtained.  Normal uterine size, no other palpable masses, no uterine or adnexal tenderness. MUSCULOSKELETAL: Normal range of motion. No tenderness.  No cyanosis, clubbing, or edema   Assessment:  Annual gynecologic examination with pap smear   Plan:  Will follow up results of pap smear and manage accordingly. Mammogram scheduled Routine preventative health maintenance measures emphasized. Please refer to After Visit Summary for other counseling recommendations.    LYNWOOD SOLOMONS, MD Attending Obstetrician & Gynecologist Center for [employer], [facility]

## 2023-11-17 LAB — RPR+HBSAG+HCVAB+...
HIV Screen 4th Generation wRfx: NONREACTIVE
Hep C Virus Ab: NONREACTIVE
Hepatitis B Surface Ag: NEGATIVE
RPR Ser Ql: NONREACTIVE

## 2023-11-19 ENCOUNTER — Other Ambulatory Visit (HOSPITAL_COMMUNITY): Payer: Self-pay | Admitting: Family

## 2023-11-19 ENCOUNTER — Other Ambulatory Visit: Payer: Self-pay | Admitting: Sports Medicine

## 2023-11-19 LAB — CYTOLOGY - PAP
Adequacy: ABSENT
Chlamydia: NEGATIVE
Comment: NEGATIVE
Comment: NEGATIVE
Comment: NEGATIVE
Comment: NORMAL
Diagnosis: NEGATIVE
High risk HPV: NEGATIVE
Neisseria Gonorrhea: NEGATIVE
Trichomonas: NEGATIVE

## 2023-11-22 ENCOUNTER — Ambulatory Visit: Payer: Self-pay | Admitting: Obstetrics & Gynecology

## 2023-11-24 ENCOUNTER — Encounter: Payer: Self-pay | Admitting: Physical Therapy

## 2023-11-24 ENCOUNTER — Ambulatory Visit: Payer: Self-pay | Attending: Sports Medicine | Admitting: Physical Therapy

## 2023-11-24 DIAGNOSIS — R293 Abnormal posture: Secondary | ICD-10-CM | POA: Insufficient documentation

## 2023-11-24 DIAGNOSIS — R252 Cramp and spasm: Secondary | ICD-10-CM | POA: Insufficient documentation

## 2023-11-24 DIAGNOSIS — R0781 Pleurodynia: Secondary | ICD-10-CM | POA: Diagnosis present

## 2023-11-24 DIAGNOSIS — M6281 Muscle weakness (generalized): Secondary | ICD-10-CM | POA: Diagnosis present

## 2023-11-24 DIAGNOSIS — R55 Syncope and collapse: Secondary | ICD-10-CM | POA: Diagnosis not present

## 2023-11-24 DIAGNOSIS — M546 Pain in thoracic spine: Secondary | ICD-10-CM | POA: Insufficient documentation

## 2023-11-24 DIAGNOSIS — R102 Pelvic and perineal pain: Secondary | ICD-10-CM | POA: Insufficient documentation

## 2023-11-24 DIAGNOSIS — M542 Cervicalgia: Secondary | ICD-10-CM | POA: Diagnosis present

## 2023-11-24 DIAGNOSIS — Z95818 Presence of other cardiac implants and grafts: Secondary | ICD-10-CM | POA: Diagnosis not present
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
## 2023-11-30 ENCOUNTER — Encounter: Payer: Self-pay | Admitting: Family Medicine

## 2023-12-01 ENCOUNTER — Ambulatory Visit: Admitting: Physical Therapy

## 2023-12-02 ENCOUNTER — Ambulatory Visit: Admitting: Family Medicine

## 2023-12-04 ENCOUNTER — Telehealth: Payer: Self-pay | Admitting: Physical Therapy

## 2023-12-04 ENCOUNTER — Ambulatory Visit: Admitting: Physical Therapy

## 2023-12-04 NOTE — Telephone Encounter (Signed)
 Left patient a voicemail about missed appointment today. Reminded patient of next scheduled appointment 7/22 8am and left a call back number to the clinic.   Kristeen Sar, PT 12/04/23 8:29 AM

## 2023-12-08 ENCOUNTER — Encounter: Payer: Self-pay | Admitting: Sports Medicine

## 2023-12-08 ENCOUNTER — Ambulatory Visit: Admitting: Physical Therapy

## 2023-12-08 NOTE — Telephone Encounter (Signed)
 Scheduled 12/09/2023.

## 2023-12-09 ENCOUNTER — Ambulatory Visit: Admitting: Sports Medicine

## 2023-12-09 ENCOUNTER — Ambulatory Visit (INDEPENDENT_AMBULATORY_CARE_PROVIDER_SITE_OTHER)

## 2023-12-09 VITALS — HR 67 | Ht 68.0 in | Wt 187.0 lb

## 2023-12-09 DIAGNOSIS — M25561 Pain in right knee: Secondary | ICD-10-CM

## 2023-12-09 DIAGNOSIS — M25461 Effusion, right knee: Secondary | ICD-10-CM | POA: Diagnosis not present

## 2023-12-09 NOTE — Progress Notes (Signed)
 [facility] [address] [place] [address] Phone: [phone]   Assessment and Plan:     1. Acute pain of right knee 2. Effusion of right knee  -Acute, initial visit - Right knee pain, clicking, swelling x 10 days.  Concerning for possible meniscal pathology based on HPI, physical exam - X-ray obtained in clinic.  My interpretation: No acute fracture or dislocation.  Mild to moderate joint effusion - Patient elected for intra-articular CSI.  Tolerated well per note below - Use Tylenol  500 to 1000 mg tablets 2-3 times a day for day-to-day pain relief - Use meloxicam  15 mg daily as needed for pain.  Recommend limiting chronic NSAIDs to 1-2 doses per week to prevent long-term side effects. - Start HEP to prevent knee weakness  Procedure: Knee Joint Injection Side: Right Indication: Knee pain and effusion  Risks explained and consent was given verbally. The site was cleaned with alcohol prep. A needle was introduced with an anterio-lateral approach. Injection given using 2mL of 1% lidocaine  without epinephrine and 1mL of kenalog 40mg /ml. This was well tolerated.  Needle was removed, hemostasis achieved, and post injection instructions were explained.   Pt was advised to call or return to clinic if these symptoms worsen or fail to improve as anticipated.   15 additional minutes spent for educating Therapeutic Home Exercise Program.  This included exercises focusing on stretching, strengthening, with focus on eccentric aspects.   Long term goals include an improvement in range of motion, strength, endurance as well as avoiding reinjury. Patient's frequency would include in 1-2 times a day, 3-5 times a week for a duration of 6-12 weeks. Proper technique shown and discussed handout in great detail with ATC.  All questions were discussed and answered.    Pertinent previous records reviewed include none  Follow Up: 4 weeks for  reevaluation.  If no improvement or worsening of symptoms, could consider physical therapy versus MRI   Subjective:   I, Marie Brown, am serving as a Neurosurgeon for Doctor Marie Brown  Chief Complaint: right knee pain   HPI:   12/10/2023 Patient is a 33 year old female with right knee pain. Patient states pain started a week ago pain has been increasing . No MOI. Endorses painful popping. She has been icing and heating. Decreased ROM. Medial knee pain. Pain radiates to the back of the knee. Throbbing dull ache. Ibu for the pain and that does not help. Robaxin  does not help either.    Relevant Historical Information: History of seizure-like activity  Additional pertinent review of systems negative.   Current Outpatient Medications:    levETIRAcetam  (KEPPRA  XR) 500 MG 24 hr tablet, Take 1 tablet (500 mg total) by mouth 2 (two) times daily., Disp: 180 tablet, Rfl: 3   meloxicam  (MOBIC ) 7.5 MG tablet, TAKE 1 TABLET(7.5 MG) BY MOUTH DAILY, Disp: 30 tablet, Rfl: 0   methocarbamol  (ROBAXIN ) 500 MG tablet, TAKE 1 TABLET(500 MG) BY MOUTH EVERY 6 HOURS AS NEEDED FOR MUSCLE SPASMS, Disp: 30 tablet, Rfl: 0   nadolol (CORGARD) 20 MG tablet, Take 20 mg by mouth daily., Disp: , Rfl:    promethazine  (PHENERGAN ) 25 MG tablet, Take 25 mg by mouth every 6 (six) hours as needed for nausea or vomiting., Disp: , Rfl:    SUMAtriptan (IMITREX) 50 MG tablet, Take 50 mg by mouth once., Disp: , Rfl:    traZODone  (DESYREL ) 100 MG tablet, Take 1 tablet (100 mg total)  by mouth at bedtime., Disp: 60 tablet, Rfl: 0   valACYclovir (VALTREX) 1000 MG tablet, Take 1,000 mg by mouth daily., Disp: , Rfl:    Objective:     Vitals:   12/09/23 1323  Pulse: 67  SpO2: 98%  Weight: 187 lb (84.8 kg)  Height: 5' 8 (1.727 m)      Body mass index is 28.43 kg/m.    Physical Exam:    General:  awake, alert oriented, no acute distress nontoxic Skin: no suspicious lesions or rashes Neuro:sensation intact and strength  5/5 with no deficits, no atrophy, normal muscle tone Psych: No signs of anxiety, depression or other mood disorder  Right knee: Mild swelling No deformity Positive fluid wave, joint milking ROM Flex 100, Ext 0 TTP medial joint line, , lateral joint line NTTP over the quad tendon, medial fem condyle, lat fem condyle, patella, plica, patella tendon, tibial tuberostiy, fibular head, posterior fossa, pes anserine bursa, gerdy's tubercle,   Neg anterior and posterior drawer Neg lachman Neg sag sign Negative varus stress Negative valgus stress Negative McMurray for palpable pop, though reproduced pain Positive Thessaly  Gait normal    Electronically signed by:  [facility] 2:07 PM 12/09/23

## 2023-12-09 NOTE — Patient Instructions (Signed)
Knee HEP  3-4 week follow up

## 2023-12-10 ENCOUNTER — Ambulatory Visit: Admitting: Family Medicine

## 2023-12-10 ENCOUNTER — Telehealth: Admitting: Family Medicine

## 2023-12-10 ENCOUNTER — Ambulatory Visit: Payer: Self-pay | Admitting: Sports Medicine

## 2023-12-10 ENCOUNTER — Encounter: Payer: Self-pay | Admitting: Family Medicine

## 2023-12-10 DIAGNOSIS — F411 Generalized anxiety disorder: Secondary | ICD-10-CM | POA: Diagnosis not present

## 2023-12-10 MED ORDER — BUSPIRONE HCL 5 MG PO TABS: 5.0000 mg | ORAL_TABLET | Freq: Two times a day (BID) | ORAL | 3 refills | Status: DC

## 2023-12-10 MED ORDER — TRAZODONE HCL 100 MG PO TABS: 100.0000 mg | ORAL_TABLET | Freq: Every day | ORAL | 1 refills | Status: DC

## 2023-12-10 NOTE — Progress Notes (Signed)
 Virtual Visit via Video note  I connected with Marie Brown on 12/10/23 at 0940 by video and verified that I am speaking with the correct person using two identifiers. Marie Brown is currently located at work and no one is currently with her during visit. The provider, Jeoffrey GORMAN Barrio, FNP is located in their office at time of visit.  I discussed the limitations, risks, security and privacy concerns of performing an evaluation and management service by video and the availability of in person appointments. I also discussed with the patient that there may be a patient responsible charge related to this service. The patient expressed understanding and agreed to proceed.  Subjective: PCP: Barrio Jeoffrey GORMAN, FNP  No chief complaint on file.   HPI Marie Brown is here today for worsening anxiety, worry, overthinking, feeling down, and difficulty sleeping. This is not new for her however she is having trouble controlling her symptoms. She denies halluctionations, delusions, HI, SI. She does see a psychiatrist and was started on Trazodone  100mg  nightly. Has been needing to take 2 tablets nightly to get to sleep and this has been helping. She is taking no other medication for anxiety or depression. Does have a history of seizure. Has notified her psychiatrist however cannot get an appt for several weeks.    ROS: Per HPI  Current Outpatient Medications:    busPIRone  (BUSPAR ) 5 MG tablet, Take 1 tablet (5 mg total) by mouth 2 (two) times daily., Disp: 60 tablet, Rfl: 3   levETIRAcetam  (KEPPRA  XR) 500 MG 24 hr tablet, Take 1 tablet (500 mg total) by mouth 2 (two) times daily., Disp: 180 tablet, Rfl: 3   meloxicam  (MOBIC ) 7.5 MG tablet, TAKE 1 TABLET(7.5 MG) BY MOUTH DAILY, Disp: 30 tablet, Rfl: 0   methocarbamol  (ROBAXIN ) 500 MG tablet, TAKE 1 TABLET(500 MG) BY MOUTH EVERY 6 HOURS AS NEEDED FOR MUSCLE SPASMS, Disp: 30 tablet, Rfl: 0   nadolol (CORGARD) 20 MG tablet, Take 20 mg by mouth daily.,  Disp: , Rfl:    promethazine  (PHENERGAN ) 25 MG tablet, Take 25 mg by mouth every 6 (six) hours as needed for nausea or vomiting., Disp: , Rfl:    SUMAtriptan (IMITREX) 50 MG tablet, Take 50 mg by mouth once., Disp: , Rfl:    traZODone  (DESYREL ) 100 MG tablet, Take 1 tablet (100 mg total) by mouth at bedtime., Disp: 90 tablet, Rfl: 1   valACYclovir (VALTREX) 1000 MG tablet, Take 1,000 mg by mouth daily., Disp: , Rfl:   Observations/Objective: Physical Exam Constitutional:      General: She is not in acute distress.    Appearance: Normal appearance. She is not toxic-appearing.  Eyes:     Conjunctiva/sclera: Conjunctivae normal.  Pulmonary:     Effort: Pulmonary effort is normal. No respiratory distress.  Skin:    Coloration: Skin is not pale.  Neurological:     General: No focal deficit present.     Mental Status: She is alert and oriented to person, place, and time.  Psychiatric:        Mood and Affect: Mood normal.        Behavior: Behavior normal.        Thought Content: Thought content normal.        Judgment: Judgment normal.    Assessment and Plan: Generalized anxiety disorder Assessment & Plan: Start Buspar  5mg  BID. Continue Trazodone  100mg  nightly. May need to increase to 200mg  nightly but would like her to try the Buspar  first  and be mindful of drowsiness. Follow up in 4 weeks or sooner if needed. Keep appt with psych.    Other orders -     busPIRone  HCl; Take 1 tablet (5 mg total) by mouth 2 (two) times daily.  Dispense: 60 tablet; Refill: 3 -     traZODone  HCl; Take 1 tablet (100 mg total) by mouth at bedtime.  Dispense: 90 tablet; Refill: 1    Follow Up Instructions: No follow-ups on file.   I discussed the assessment and treatment plan with the patient. The patient was provided an opportunity to ask questions and all were answered. The patient agreed with the plan and demonstrated an understanding of the instructions.   The patient was advised to call back or  seek an in-person evaluation if the symptoms worsen or if the condition fails to improve as anticipated.  The above assessment and management plan was discussed with the patient. The patient verbalized understanding of and has agreed to the management plan. Patient is aware to call the clinic if symptoms persist or worsen. Patient is aware when to return to the clinic for a follow-up visit. Patient educated on when it is appropriate to go to the emergency department.   Time call ended: 0951  I provided 11 minutes of face-to-face time during this encounter.   Jeoffrey Barrio, MSN, APRN, FNP-C [facility]

## 2023-12-10 NOTE — Assessment & Plan Note (Signed)
 Start Buspar  5mg  BID. Continue Trazodone  100mg  nightly. May need to increase to 200mg  nightly but would like her to try the Buspar  first and be mindful of drowsiness. Follow up in 4 weeks or sooner if needed. Keep appt with psych.

## 2023-12-11 ENCOUNTER — Ambulatory Visit: Admitting: Physical Therapy

## 2023-12-14 ENCOUNTER — Ambulatory Visit: Admitting: Sports Medicine

## 2023-12-15 ENCOUNTER — Ambulatory Visit

## 2023-12-18 ENCOUNTER — Encounter: Admitting: Physical Therapy

## 2023-12-22 ENCOUNTER — Telehealth (HOSPITAL_COMMUNITY): Admitting: Family

## 2023-12-22 ENCOUNTER — Encounter: Admitting: Physical Therapy

## 2023-12-22 ENCOUNTER — Encounter (HOSPITAL_COMMUNITY): Payer: Self-pay

## 2023-12-25 ENCOUNTER — Encounter: Admitting: Physical Therapy

## 2023-12-29 ENCOUNTER — Encounter: Admitting: Physical Therapy

## 2023-12-29 DIAGNOSIS — Z95818 Presence of other cardiac implants and grafts: Secondary | ICD-10-CM | POA: Diagnosis not present

## 2023-12-29 DIAGNOSIS — R55 Syncope and collapse: Secondary | ICD-10-CM | POA: Diagnosis not present

## 2023-12-29 NOTE — Progress Notes (Signed)
 Ben Jaiona Simien D.[name] [address] [place] [address] Phone: [phone]   Assessment and Plan:     1. Acute pain of right knee 2. Effusion of right knee -Subacute, no improvement, subsequent visit - No significant improvement despite CSI, as needed meloxicam , HEP, relative rest.  Concern for meniscal pathology based on HPI, physical exam - Due to concern for meniscal pathology, no improvement despite conservative therapy, unremarkable x-ray imaging, recommend further evaluation with right knee MRI - Use meloxicam  15 mg daily as needed for pain.  Recommend limiting chronic NSAIDs to 1-2 doses per week to prevent long-term side effects. - Use Tylenol  500 to 1000 mg tablets 2-3 times a day for day-to-day pain relief    Pertinent previous records reviewed include none  Follow Up: 5 days after MRI to review results and discuss treatment plan   Subjective:   I, Chestine Reeves, am serving as a Neurosurgeon for Doctor Morene Mace   Chief Complaint: right knee pain    HPI:    12/10/2023 Patient is a 33 year old female with right knee pain. Patient states pain started a week ago pain has been increasing . No MOI. Endorses painful popping. She has been icing and heating. Decreased ROM. Medial knee pain. Pain radiates to the back of the knee. Throbbing dull ache. Ibu for the pain and that does not help. Robaxin  does not help either.    12/30/2023 Patient states she is the same . Pain is increased when it rains    Relevant Historical Information: History of seizure-like activity Additional pertinent review of systems negative.   Current Outpatient Medications:    busPIRone  (BUSPAR ) 5 MG tablet, Take 1 tablet (5 mg total) by mouth 2 (two) times daily., Disp: 60 tablet, Rfl: 3   levETIRAcetam  (KEPPRA  XR) 500 MG 24 hr tablet, Take 1 tablet (500 mg total) by mouth 2 (two) times daily., Disp: 180 tablet, Rfl: 3   meloxicam  (MOBIC ) 7.5 MG tablet,  TAKE 1 TABLET(7.5 MG) BY MOUTH DAILY, Disp: 30 tablet, Rfl: 0   methocarbamol  (ROBAXIN ) 500 MG tablet, TAKE 1 TABLET(500 MG) BY MOUTH EVERY 6 HOURS AS NEEDED FOR MUSCLE SPASMS, Disp: 30 tablet, Rfl: 0   nadolol (CORGARD) 20 MG tablet, Take 20 mg by mouth daily., Disp: , Rfl:    promethazine  (PHENERGAN ) 25 MG tablet, Take 25 mg by mouth every 6 (six) hours as needed for nausea or vomiting., Disp: , Rfl:    SUMAtriptan (IMITREX) 50 MG tablet, Take 50 mg by mouth once., Disp: , Rfl:    traZODone  (DESYREL ) 100 MG tablet, Take 1 tablet (100 mg total) by mouth at bedtime., Disp: 90 tablet, Rfl: 1   valACYclovir (VALTREX) 1000 MG tablet, Take 1,000 mg by mouth daily., Disp: , Rfl:    Objective:     Vitals:   12/30/23 1538  Pulse: 64  SpO2: 99%  Weight: 187 lb (84.8 kg)  Height: 5' 8 (1.727 m)      Body mass index is 28.43 kg/m.    Physical Exam:    General:  awake, alert oriented, no acute distress nontoxic Skin: no suspicious lesions or rashes Neuro:sensation intact and strength 5/5 with no deficits, no atrophy, normal muscle tone Psych: No signs of anxiety, depression or other mood disorder   Right knee: Mild swelling No deformity Positive fluid wave, joint milking ROM Flex 100, Ext 0 TTP medial joint line, , lateral joint line NTTP over the quad tendon,  medial fem condyle, lat fem condyle, patella, plica, patella tendon, tibial tuberostiy, fibular head, posterior fossa, pes anserine bursa, gerdy's tubercle,   Neg anterior and posterior drawer Neg lachman Neg sag sign Negative varus stress Negative valgus stress Negative McMurray for palpable pop, though reproduced pain Positive Thessaly   Gait normal     Electronically signed by:  Odis Mace D.[name] 4:24 PM 12/30/23

## 2023-12-30 ENCOUNTER — Ambulatory Visit (INDEPENDENT_AMBULATORY_CARE_PROVIDER_SITE_OTHER): Admitting: Sports Medicine

## 2023-12-30 VITALS — HR 64 | Ht 68.0 in | Wt 187.0 lb

## 2023-12-30 DIAGNOSIS — M25561 Pain in right knee: Secondary | ICD-10-CM

## 2023-12-30 DIAGNOSIS — M25461 Effusion, right knee: Secondary | ICD-10-CM | POA: Diagnosis not present

## 2023-12-30 NOTE — Patient Instructions (Signed)
 Thank you for coming in today  We will order an MRI for your right knee to [facility].  Their department will reach out to you to schedule that appointment.  Once you know when your MRI appointment is, call our clinic and schedule a follow-up visit for 5 days after your MRI imaging.  - Use Tylenol  500 to 1000 mg tablets 2-3 times a day for day-to-day pain relief

## 2024-01-01 ENCOUNTER — Encounter: Admitting: Physical Therapy

## 2024-01-05 ENCOUNTER — Ambulatory Visit: Admitting: Family Medicine

## 2024-01-05 ENCOUNTER — Encounter: Admitting: Physical Therapy

## 2024-01-05 ENCOUNTER — Other Ambulatory Visit: Payer: Self-pay | Admitting: Sports Medicine

## 2024-01-05 ENCOUNTER — Encounter: Admitting: Family Medicine

## 2024-01-05 NOTE — Progress Notes (Signed)
 Connected to video 1124, pt disconnected immediately. Attempted to call x1 with no answer. Pt did not reconnect before 1130. Encounter marked erroneous.

## 2024-01-07 ENCOUNTER — Ambulatory Visit: Admitting: Family Medicine

## 2024-01-13 ENCOUNTER — Ambulatory Visit: Payer: Medicaid Other | Admitting: Neurology

## 2024-01-13 ENCOUNTER — Encounter: Payer: Self-pay | Admitting: Neurology

## 2024-01-13 VITALS — BP 124/76 | HR 82 | Ht 63.0 in | Wt 193.0 lb

## 2024-01-13 DIAGNOSIS — Z5181 Encounter for therapeutic drug level monitoring: Secondary | ICD-10-CM | POA: Diagnosis not present

## 2024-01-13 DIAGNOSIS — G40209 Localization-related (focal) (partial) symptomatic epilepsy and epileptic syndromes with complex partial seizures, not intractable, without status epilepticus: Secondary | ICD-10-CM | POA: Diagnosis not present

## 2024-01-13 NOTE — Progress Notes (Signed)
 [facility]  PATIENT: Marie Brown DOB: [birth year]  REQUESTING CLINICIAN: Aleene Will POUR, MD HISTORY FROM: Patient and chart review  REASON FOR VISIT: Seizure    HISTORICAL  CHIEF COMPLAINT:  Chief Complaint  Patient presents with   Follow-up    RM 12, seizures   INTERVAL HISTORY 01/13/2024 Patient presents today for follow-up, last visit was in February.  At that time we continued her on Keppra  500 mg twice daily. Check a Keppra  level but it was low.  She tells me she is doing well, denies any seizure or seizure like activities.  She reports compliance with the medication.  No other complaints or concerns other than insomnia for which she does take trazodone .   HISTORY OF PRESENT ILLNESS:  This is a 33 year old woman past medical history of left frontal cavernoma and focal epilepsy who is presenting to establish care.  Patient tells me she was diagnosed with cavernoma back in 2011 but she did have her first seizure in April 2023.  She tells me she was at work, was talking to the CNA and next thing she was on the floor with people around her.  She was very confused.  From there, she was started on Keppra  500 mg nightly, she continued to have breakthrough seizure, last 1 in July 2024 and at that time her Keppra  was increased to 500 mg twice daily.  Since then she has been compliant with the medication, denies any additional seizures.  She tells me that no one has ever witnessed the seizure from start to finish so unable to provide description of seizure but she did had tongue biting with her first seizure, and she will definitely lose consciousness.  Denies any other injury from the seizure.   Her chart indicates that she is pregnant but patient tells me she did have a miscarriage in November.   Handedness: Right handed   Onset: April 2023  Seizure Type: presume generalized convulsion  Current frequency: Last seizure July 2024  Any injuries from  seizures: Tongue biting   Seizure risk factors: Left frontal cavernoma  Previous ASMs: Levetiracetam    Currenty ASMs: Levetiracetam  500 mg twice daily   ASMs side effects: None   Brain Images: Left frontal cavernoma   Previous EEGs: Left frontal discharges    OTHER MEDICAL CONDITIONS: Left frontal cavernoma  REVIEW OF SYSTEMS: Full 14 system review of systems performed and negative with exception of: As noted in the HPI  ALLERGIES: No Known Allergies  HOME MEDICATIONS: Outpatient Medications Prior to Visit  Medication Sig Dispense Refill   busPIRone  (BUSPAR ) 5 MG tablet Take 1 tablet (5 mg total) by mouth 2 (two) times daily. 60 tablet 3   levETIRAcetam  (KEPPRA  XR) 500 MG 24 hr tablet Take 1 tablet (500 mg total) by mouth 2 (two) times daily. 180 tablet 3   meloxicam  (MOBIC ) 7.5 MG tablet TAKE 1 TABLET(7.5 MG) BY MOUTH DAILY 30 tablet 0   methocarbamol  (ROBAXIN ) 500 MG tablet TAKE 1 TABLET(500 MG) BY MOUTH EVERY 6 HOURS AS NEEDED FOR MUSCLE SPASMS 30 tablet 0   nadolol (CORGARD) 20 MG tablet Take 20 mg by mouth daily.     promethazine  (PHENERGAN ) 25 MG tablet Take 25 mg by mouth every 6 (six) hours as needed for nausea or vomiting.     SUMAtriptan (IMITREX) 50 MG tablet Take 50 mg by mouth once.     traZODone  (DESYREL ) 100 MG tablet Take 1 tablet (100 mg total) by mouth at bedtime.  90 tablet 1   valACYclovir (VALTREX) 1000 MG tablet Take 1,000 mg by mouth daily.     No facility-administered medications prior to visit.    PAST MEDICAL HISTORY: Past Medical History:  Diagnosis Date   Bipolar 2 disorder (HCC)    Carpal tunnel syndrome on both sides    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 ;   last MRI in care everywhere 08-25-2021   History of acute pyelonephritis 03/28/2022   admission in epic w/ UTI due to obstructive uropathy due to kidney stone   History of kidney stones    History of syncope    (04-30-2022  pt stated last episode 03-27-2022  in setting kidney stone  pain )   syncope w/ collapse with recurrent near syncope/ syncope ;   cardiologsit --- dr uvaldo ronco note 11-11-2021 work-up results in care everywhere event monitor showed benign w/ low burden PACs/ PVCs & evidence inappropriate ST;  normal ETT and echo   IDA (iron deficiency anemia)    Left ureteral calculus    Migraine    Palpitations    (04-30-2022  pt stated taking toprol  daily has helped)   pt is scheduled for loop recorder insertion 05-08-2022 @ [facility]   Seizure-like activity (HCC)    (04-30-2022  pt stated last syncope/ seizure like activity 03-27-2022 in setting kidney stone pain)   neurologist-- dr forbes. talitha;   started 04/ 2023 with syncope w/ collapse had seizure like acitivity;   MRI  left frontal cerebral cavenoma in 2011 and last MRI in care everywhere 08-25-2021 same;   EEG 04-16-2022 care everywhere normal without seizure acitivy   Urgency of urination    Wears glasses     PAST SURGICAL HISTORY: Past Surgical History:  Procedure Laterality Date   CYSTOSCOPY W/ URETERAL STENT PLACEMENT Left 03/28/2022   Procedure: CYSTOSCOPY WITH RETROGRADE PYELOGRAM/URETERAL STENT PLACEMENT;  Surgeon: Selma Donnice SAUNDERS, MD;  Location: [facility];  Service: Urology;  Laterality: Left;   CYSTOSCOPY/URETEROSCOPY/HOLMIUM LASER/STENT PLACEMENT Left 05/02/2022   Procedure: CYSTOSCOPY/LEFT URETEROSCOPY/HOLMIUM LASER/LEFT RETROGRADE PYELOGRAM/LEFT STENT PLACEMENT;  Surgeon: Selma Donnice SAUNDERS, MD;  Location: [facility];  Service: Urology;  Laterality: Left;  60 MINUTES NEEDED FOR CASE.   FOOT SURGERY Bilateral 2005   bunion   LAPAROSCOPIC APPENDECTOMY  12/20/2009   @[facility]  by dr b. zeigler    FAMILY HISTORY: Family History  Problem Relation Age of Onset   Hypertension Mother    Diabetes Other    Thyroid  disease Other    Heart disease Other    Birth defects Other    Mental illness Other    Stroke Other    Breast cancer Other    Graves' disease Maternal Grandmother    Multiple sclerosis  Maternal Grandfather    Suicidality Father     SOCIAL HISTORY: Social History   Socioeconomic History   Marital status: Single    Spouse name: Not on file   Number of children: Not on file   Years of education: Not on file   Highest education level: GED or equivalent  Occupational History   Not on file  Tobacco Use   Smoking status: Never   Smokeless tobacco: Never  Vaping Use   Vaping status: Every Day   Substances: Nicotine   Devices: orion  Substance and Sexual Activity   Alcohol use: Not Currently   Drug use: Never   Sexual activity: Yes    Birth control/protection: Condom  Other Topics Concern   Not  on file  Social History Narrative   Right handed   Caffeine -3 cups daily, drinks red bull   Social Drivers of Health   Financial Resource Strain: Medium Risk (12/10/2023)   Overall Financial Resource Strain (CARDIA)    Difficulty of Paying Living Expenses: Somewhat hard  Food Insecurity: Food Insecurity Present (12/10/2023)   Hunger Vital Sign    Worried About Running Out of Food in the Last Year: Sometimes true    Ran Out of Food in the Last Year: Sometimes true  Transportation Needs: Unmet Transportation Needs (12/10/2023)   PRAPARE - Transportation    Lack of Transportation (Medical): Yes    Lack of Transportation (Non-Medical): Yes  Physical Activity: Insufficiently Active (12/10/2023)   Exercise Vital Sign    Days of Exercise per Week: 1 day    Minutes of Exercise per Session: 20 min  Stress: Stress Concern Present (12/10/2023)   [employer] of Occupational Health - Occupational Stress Questionnaire    Feeling of Stress: Very much  Social Connections: Socially Isolated (12/10/2023)   Social Connection and Isolation Panel    Frequency of Communication with Friends and Family: Once a week    Frequency of Social Gatherings with Friends and Family: Never    Attends Religious Services: 1 to 4 times per year    Active Member of [employer] or Organizations: No     Attends Engineer, structural: Not on file    Marital Status: Never married  Intimate Partner Violence: Not At Risk (11/24/2022)   Humiliation, Afraid, Rape, and Kick questionnaire    Fear of Current or Ex-Partner: No    Emotionally Abused: No    Physically Abused: No    Sexually Abused: No    PHYSICAL EXAM  GENERAL EXAM/CONSTITUTIONAL: Vitals:  Vitals:   01/13/24 1100  BP: 124/76  Pulse: 82  SpO2: 99%  Weight: 193 lb (87.5 kg)  Height: 5' 3 (1.6 m)   Body mass index is 34.19 kg/m. Wt Readings from Last 3 Encounters:  01/13/24 193 lb (87.5 kg)  12/30/23 187 lb (84.8 kg)  12/09/23 187 lb (84.8 kg)   Patient is in no distress; well developed, nourished and groomed; neck is supple  MUSCULOSKELETAL: Gait, strength, tone, movements noted in Neurologic exam below  NEUROLOGIC: MENTAL STATUS:      No data to display         awake, alert, oriented to person, place and time recent and remote memory intact normal attention and concentration language fluent, comprehension intact, naming intact fund of knowledge appropriate  CRANIAL NERVE:  2nd, 3rd, 4th, 6th - Visual fields full to confrontation, extraocular muscles intact, no nystagmus 5th - facial sensation symmetric 7th - facial strength symmetric 8th - hearing intact 9th - palate elevates symmetrically, uvula midline 11th - shoulder shrug symmetric 12th - tongue protrusion midline  MOTOR:  normal bulk and tone, full strength in the BUE, BLE  SENSORY:  normal and symmetric to light touch  COORDINATION:  finger-nose-finger, fine finger movements normal  GAIT/STATION:  normal     DIAGNOSTIC DATA (LABS, IMAGING, TESTING) - I reviewed patient records, labs, notes, testing and imaging myself where available.  Lab Results  Component Value Date   WBC 6.1 08/05/2023   HGB 13.2 08/05/2023   HCT 38.7 08/05/2023   MCV 93.5 08/05/2023   PLT 317 08/05/2023      Component Value Date/Time   NA 139  08/05/2023 1507   K 4.4 08/05/2023 1507   CL 103  08/05/2023 1507   CO2 27 08/05/2023 1507   GLUCOSE 67 08/05/2023 1507   BUN 13 08/05/2023 1507   CREATININE 0.76 08/05/2023 1507   CALCIUM 10.1 08/05/2023 1507   PROT 7.6 08/05/2023 1507   ALBUMIN 4.8 03/08/2023 1553   AST 18 08/05/2023 1507   ALT 17 08/05/2023 1507   ALKPHOS 38 03/08/2023 1553   BILITOT 0.4 08/05/2023 1507   GFRNONAA >60 03/09/2023 1545   GFRAA >60 08/26/2019 1425   Lab Results  Component Value Date   CHOL 227 (H) 08/05/2023   HDL 62 08/05/2023   LDLCALC 147 (H) 08/05/2023   TRIG 77 08/05/2023   Lab Results  Component Value Date   HGBA1C 5.2 08/05/2023   Lab Results  Component Value Date   VITAMINB12 331 08/05/2023   Lab Results  Component Value Date   TSH 0.33 (L) 08/05/2023   CT head 03/08/2023 1. No evidence of acute intracranial abnormality or facial fracture. 2. 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  EEG 11/25/2022: Normal  LTM 01/12/2023: This is abnormal long term EEG, demonstrating left frontotemporal sharp waves and spikes   I personally reviewed brain Images   ASSESSMENT AND PLAN  33 y.o. year old female  with history of left frontal cavernoma and epilepsy who is presenting for follow up.  She is doing well on Keppra  500 mg twice daily.  Plan will be to obtain a Keppra  level, I will continue her on Keppra  500 mg twice daily.  I will see her in 6 months for follow-up or sooner if worse.  Patient understand to contact me sooner if she does have any breakthrough seizure or any other question or concerns.   1. Partial symptomatic epilepsy with complex partial seizures, not intractable, without status epilepticus (HCC)   2. Therapeutic drug monitoring     Patient Instructions  Continue with Keppra  500 mg twice daily, refill given Will check Keppra  level and vitamin D  Continue your other medications Please contact us  if you  do have a seizure Follow-up in 6 months or sooner if worse.   Per [place] statutes, patients with seizures are not allowed to drive until they have been seizure-free for six months.  Other recommendations include using caution when using heavy equipment or power tools. Avoid working on ladders or at heights. Take showers instead of baths.  Do not swim alone.  Ensure the water temperature is not too high on the home water heater. Do not go swimming alone. Do not lock yourself in a room alone (i.e. bathroom). When caring for infants or small children, sit down when holding, feeding, or changing them to minimize risk of injury to the child in the event you have a seizure. Maintain good sleep hygiene. Avoid alcohol.  Also recommend adequate sleep, hydration, good diet and minimize stress.   During the Seizure  - First, ensure adequate ventilation and place patients on the floor on their left side  Loosen clothing around the neck and ensure the airway is patent. If the patient is clenching the teeth, do not force the mouth open with any object as this can cause severe damage - Remove all items from the surrounding that can be hazardous. The patient may be oblivious to what's happening and may not even know what he or she is doing. If the patient is confused and wandering, either gently guide him/her away and block access to outside areas - Reassure the individual  and be comforting - Call 911. In most cases, the seizure ends before EMS arrives. However, there are cases when seizures may last over 3 to 5 minutes. Or the individual may have developed breathing difficulties or severe injuries. If a pregnant patient or a person with diabetes develops a seizure, it is prudent to call an ambulance. - Finally, if the patient does not regain full consciousness, then call EMS. Most patients will remain confused for about 45 to 90 minutes after a seizure, so you must use judgment in calling for help. -  Avoid restraints but make sure the patient is in a bed with padded side rails - Place the individual in a lateral position with the neck slightly flexed; this will help the saliva drain from the mouth and prevent the tongue from falling backward - Remove all nearby furniture and other hazards from the area - Provide verbal assurance as the individual is regaining consciousness - Provide the patient with privacy if possible - Call for help and start treatment as ordered by the caregiver   After the Seizure (Postictal Stage)  After a seizure, most patients experience confusion, fatigue, muscle pain and/or a headache. Thus, one should permit the individual to sleep. For the next few days, reassurance is essential. Being calm and helping reorient the person is also of importance.  Most seizures are painless and end spontaneously. Seizures are not harmful to others but can lead to complications such as stress on the lungs, brain and the heart. Individuals with prior lung problems may develop labored breathing and respiratory distress.    Discussed Patients with epilepsy have a small risk of sudden unexpected death, a condition referred to as sudden unexpected death in epilepsy (SUDEP). SUDEP is defined specifically as the sudden, unexpected, witnessed or unwitnessed, nontraumatic and nondrowning death in patients with epilepsy with or without evidence for a seizure, and excluding documented status epilepticus, in which post mortem examination does not reveal a structural or toxicologic cause for death     Orders Placed This Encounter  Procedures   Levetiracetam  level   Vitamin D , 25-hydroxy    No orders of the defined types were placed in this encounter.   Return in about 6 months (around 07/15/2024).    Pastor Falling, MD 01/13/2024, 11:40 AM  [facility] [address] [place], [place] [address] [phone]

## 2024-01-13 NOTE — Patient Instructions (Signed)
 Continue with Keppra  500 mg twice daily, refill given Will check Keppra  level and vitamin D  Continue your other medications Please contact us  if you do have a seizure Follow-up in 6 months or sooner if worse.

## 2024-01-16 ENCOUNTER — Ambulatory Visit
Admission: RE | Admit: 2024-01-16 | Discharge: 2024-01-16 | Disposition: A | Discharge status: Home / Self Care | Source: Ambulatory Visit | Attending: Sports Medicine

## 2024-01-16 DIAGNOSIS — M25461 Effusion, right knee: Secondary | ICD-10-CM

## 2024-01-16 DIAGNOSIS — M25561 Pain in right knee: Secondary | ICD-10-CM

## 2024-01-19 ENCOUNTER — Ambulatory Visit: Payer: Self-pay | Admitting: Sports Medicine

## 2024-02-01 ENCOUNTER — Encounter: Payer: Self-pay | Admitting: Neurology

## 2024-02-02 DIAGNOSIS — R55 Syncope and collapse: Secondary | ICD-10-CM | POA: Diagnosis not present

## 2024-02-02 DIAGNOSIS — Z95818 Presence of other cardiac implants and grafts: Secondary | ICD-10-CM | POA: Diagnosis not present

## 2024-02-08 ENCOUNTER — Telehealth (INDEPENDENT_AMBULATORY_CARE_PROVIDER_SITE_OTHER): Admitting: Neurology

## 2024-02-08 ENCOUNTER — Encounter: Payer: Self-pay | Admitting: Neurology

## 2024-02-08 DIAGNOSIS — G43009 Migraine without aura, not intractable, without status migrainosus: Secondary | ICD-10-CM

## 2024-02-08 DIAGNOSIS — G40209 Localization-related (focal) (partial) symptomatic epilepsy and epileptic syndromes with complex partial seizures, not intractable, without status epilepticus: Secondary | ICD-10-CM | POA: Diagnosis not present

## 2024-02-08 NOTE — Progress Notes (Signed)
 [facility]  PATIENT: Marie Brown DOB: 1991-02-20  REQUESTING CLINICIAN: Kayla Jeoffrey GORMAN, FNP HISTORY FROM: Patient and chart review  REASON FOR VISIT: Seizure    HISTORICAL  CHIEF COMPLAINT:  Chief Complaint  Patient presents with   Headache    Follow up    INTERVAL HISTORY 02/08/2024 Patient presents today for follow-up, last visit was in August.  At that time we continued on her medications.  She tells me last week she did have a migraine at work, took Imitrex which always make her feel tired and her work wanted her to have a doctor's note.  Denies any seizure, her seizures are typically generalized convulsion.  No other complaints, other concerns.   INTERVAL HISTORY 01/13/2024 Patient presents today for follow-up, last visit was in February.  At that time we continued her on Keppra  500 mg twice daily. Check a Keppra  level but it was low.  She tells me she is doing well, denies any seizure or seizure like activities.  She reports compliance with the medication.  No other complaints or concerns other than insomnia for which she does take trazodone .   HISTORY OF PRESENT ILLNESS:  This is a 33 year old woman past medical history of left frontal cavernoma and focal epilepsy who is presenting to establish care.  Patient tells me she was diagnosed with cavernoma back in 2011 but she did have her first seizure in April 2023.  She tells me she was at work, was talking to the CNA and next thing she was on the floor with people around her.  She was very confused.  From there, she was started on Keppra  500 mg nightly, she continued to have breakthrough seizure, last 1 in July 2024 and at that time her Keppra  was increased to 500 mg twice daily.  Since then she has been compliant with the medication, denies any additional seizures.  She tells me that no one has ever witnessed the seizure from start to finish so unable to provide description of seizure but she did had tongue  biting with her first seizure, and she will definitely lose consciousness.  Denies any other injury from the seizure.   Her chart indicates that she is pregnant but patient tells me she did have a miscarriage in November.   Handedness: Right handed   Onset: April 2023  Seizure Type: presume generalized convulsion  Current frequency: Last seizure July 2024  Any injuries from seizures: Tongue biting   Seizure risk factors: Left frontal cavernoma  Previous ASMs: Levetiracetam    Currenty ASMs: Levetiracetam  500 mg twice daily   ASMs side effects: None   Brain Images: Left frontal cavernoma   Previous EEGs: Left frontal discharges    OTHER MEDICAL CONDITIONS: Left frontal cavernoma  REVIEW OF SYSTEMS: Full 14 system review of systems performed and negative with exception of: As noted in the HPI  ALLERGIES: No Known Allergies  HOME MEDICATIONS: Outpatient Medications Prior to Visit  Medication Sig Dispense Refill   busPIRone  (BUSPAR ) 5 MG tablet Take 1 tablet (5 mg total) by mouth 2 (two) times daily. 60 tablet 3   levETIRAcetam  (KEPPRA  XR) 500 MG 24 hr tablet Take 1 tablet (500 mg total) by mouth 2 (two) times daily. 180 tablet 3   meloxicam  (MOBIC ) 7.5 MG tablet TAKE 1 TABLET(7.5 MG) BY MOUTH DAILY 30 tablet 0   methocarbamol  (ROBAXIN ) 500 MG tablet TAKE 1 TABLET(500 MG) BY MOUTH EVERY 6 HOURS AS NEEDED FOR MUSCLE SPASMS 30 tablet 0  nadolol (CORGARD) 20 MG tablet Take 20 mg by mouth daily.     promethazine  (PHENERGAN ) 25 MG tablet Take 25 mg by mouth every 6 (six) hours as needed for nausea or vomiting.     SUMAtriptan (IMITREX) 50 MG tablet Take 50 mg by mouth once.     traZODone  (DESYREL ) 100 MG tablet Take 1 tablet (100 mg total) by mouth at bedtime. 90 tablet 1   valACYclovir (VALTREX) 1000 MG tablet Take 1,000 mg by mouth daily.     No facility-administered medications prior to visit.    PAST MEDICAL HISTORY: Past Medical History:  Diagnosis Date   Bipolar 2  disorder (HCC)    Carpal tunnel syndrome on both sides    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 ;   last MRI in care everywhere 08-25-2021   History of acute pyelonephritis 03/28/2022   admission in epic w/ UTI due to obstructive uropathy due to kidney stone   History of kidney stones    History of syncope    (04-30-2022  pt stated last episode 03-27-2022  in setting kidney stone pain )   syncope w/ collapse with recurrent near syncope/ syncope ;   cardiologsit --- dr uvaldo ronco note 11-11-2021 work-up results in care everywhere event monitor showed benign w/ low burden PACs/ PVCs & evidence inappropriate ST;  normal ETT and echo   IDA (iron deficiency anemia)    Left ureteral calculus    Migraine    Palpitations    (04-30-2022  pt stated taking toprol  daily has helped)   pt is scheduled for loop recorder insertion 05-08-2022 @ [facility]   Seizure-like activity (HCC)    (04-30-2022  pt stated last syncope/ seizure like activity 03-27-2022 in setting kidney stone pain)   neurologist-- dr forbes. talitha;   started 04/ 2023 with syncope w/ collapse had seizure like acitivity;   MRI  left frontal cerebral cavenoma in 2011 and last MRI in care everywhere 08-25-2021 same;   EEG 04-16-2022 care everywhere normal without seizure acitivy   Urgency of urination    Wears glasses     PAST SURGICAL HISTORY: Past Surgical History:  Procedure Laterality Date   CYSTOSCOPY W/ URETERAL STENT PLACEMENT Left 03/28/2022   Procedure: CYSTOSCOPY WITH RETROGRADE PYELOGRAM/URETERAL STENT PLACEMENT;  Surgeon: Selma Donnice SAUNDERS, MD;  Location: [facility];  Service: Urology;  Laterality: Left;   CYSTOSCOPY/URETEROSCOPY/HOLMIUM LASER/STENT PLACEMENT Left 05/02/2022   Procedure: CYSTOSCOPY/LEFT URETEROSCOPY/HOLMIUM LASER/LEFT RETROGRADE PYELOGRAM/LEFT STENT PLACEMENT;  Surgeon: Selma Donnice SAUNDERS, MD;  Location: [facility];  Service: Urology;  Laterality: Left;  60 MINUTES NEEDED FOR CASE.   FOOT SURGERY Bilateral 2005    bunion   LAPAROSCOPIC APPENDECTOMY  12/20/2009   @[facility]  by dr b. zeigler    FAMILY HISTORY: Family History  Problem Relation Age of Onset   Hypertension Mother    Diabetes Other    Thyroid  disease Other    Heart disease Other    Birth defects Other    Mental illness Other    Stroke Other    Breast cancer Other    Graves' disease Maternal Grandmother    Multiple sclerosis Maternal Grandfather    Suicidality Father     SOCIAL HISTORY: Social History   Socioeconomic History   Marital status: Single    Spouse name: Not on file   Number of children: Not on file   Years of education: Not on file   Highest education level: GED or equivalent  Occupational History  Not on file  Tobacco Use   Smoking status: Never   Smokeless tobacco: Never  Vaping Use   Vaping status: Every Day   Substances: Nicotine   Devices: orion  Substance and Sexual Activity   Alcohol use: Not Currently   Drug use: Never   Sexual activity: Yes    Birth control/protection: Condom  Other Topics Concern   Not on file  Social History Narrative   Right handed   Caffeine -3 cups daily, drinks red bull   Social Drivers of Health   Financial Resource Strain: Medium Risk (12/10/2023)   Overall Financial Resource Strain (CARDIA)    Difficulty of Paying Living Expenses: Somewhat hard  Food Insecurity: Food Insecurity Present (12/10/2023)   Hunger Vital Sign    Worried About Running Out of Food in the Last Year: Sometimes true    Ran Out of Food in the Last Year: Sometimes true  Transportation Needs: Unmet Transportation Needs (12/10/2023)   PRAPARE - Transportation    Lack of Transportation (Medical): Yes    Lack of Transportation (Non-Medical): Yes  Physical Activity: Insufficiently Active (12/10/2023)   Exercise Vital Sign    Days of Exercise per Week: 1 day    Minutes of Exercise per Session: 20 min  Stress: Stress Concern Present (12/10/2023)   [employer] of Occupational Health - Occupational  Stress Questionnaire    Feeling of Stress: Very much  Social Connections: Socially Isolated (12/10/2023)   Social Connection and Isolation Panel    Frequency of Communication with Friends and Family: Once a week    Frequency of Social Gatherings with Friends and Family: Never    Attends Religious Services: 1 to 4 times per year    Active Member of [employer] or Organizations: No    Attends Engineer, structural: Not on file    Marital Status: Never married  Intimate Partner Violence: Not At Risk (11/24/2022)   Humiliation, Afraid, Rape, and Kick questionnaire    Fear of Current or Ex-Partner: No    Emotionally Abused: No    Physically Abused: No    Sexually Abused: No    PHYSICAL EXAM  GENERAL EXAM/CONSTITUTIONAL: Vitals:  There were no vitals filed for this visit.  There is no height or weight on file to calculate BMI. Wt Readings from Last 3 Encounters:  01/13/24 193 lb (87.5 kg)  12/30/23 187 lb (84.8 kg)  12/09/23 187 lb (84.8 kg)   Patient is in no distress; well developed, nourished and groomed; neck is supple  MUSCULOSKELETAL: Gait, strength, tone, movements noted in Neurologic exam below  NEUROLOGIC: MENTAL STATUS:      No data to display         awake, alert, oriented to person, place and time recent and remote memory intact normal attention and concentration language fluent, comprehension intact, naming intact fund of knowledge appropriate  CRANIAL NERVE:  2nd, 3rd, 4th, 6th - Visual fields full to confrontation, extraocular muscles intact, no nystagmus 5th - facial sensation symmetric 7th - facial strength symmetric 8th - hearing intact 9th - palate elevates symmetrically, uvula midline 11th - shoulder shrug symmetric 12th - tongue protrusion midline   DIAGNOSTIC DATA (LABS, IMAGING, TESTING) - I reviewed patient records, labs, notes, testing and imaging myself where available.  Lab Results  Component Value Date   WBC 6.1 08/05/2023   HGB  13.2 08/05/2023   HCT 38.7 08/05/2023   MCV 93.5 08/05/2023   PLT 317 08/05/2023      Component Value  Date/Time   NA 139 08/05/2023 1507   K 4.4 08/05/2023 1507   CL 103 08/05/2023 1507   CO2 27 08/05/2023 1507   GLUCOSE 67 08/05/2023 1507   BUN 13 08/05/2023 1507   CREATININE 0.76 08/05/2023 1507   CALCIUM 10.1 08/05/2023 1507   PROT 7.6 08/05/2023 1507   ALBUMIN 4.8 03/08/2023 1553   AST 18 08/05/2023 1507   ALT 17 08/05/2023 1507   ALKPHOS 38 03/08/2023 1553   BILITOT 0.4 08/05/2023 1507   GFRNONAA >60 03/09/2023 1545   GFRAA >60 08/26/2019 1425   Lab Results  Component Value Date   CHOL 227 (H) 08/05/2023   HDL 62 08/05/2023   LDLCALC 147 (H) 08/05/2023   TRIG 77 08/05/2023   Lab Results  Component Value Date   HGBA1C 5.2 08/05/2023   Lab Results  Component Value Date   VITAMINB12 331 08/05/2023   Lab Results  Component Value Date   TSH 0.33 (L) 08/05/2023   CT head 03/08/2023 1. No evidence of acute intracranial abnormality or facial fracture. 2. 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  EEG 11/25/2022: Normal  LTM 01/12/2023: This is abnormal long term EEG, demonstrating left frontotemporal sharp waves and spikes   I personally reviewed brain Images   ASSESSMENT AND PLAN  33 y.o. year old female  with history of left frontal cavernoma and epilepsy who is presenting for follow up.  She is doing well on Keppra  500 mg twice daily.  Continue to have migraines on average 1 to twice a week for which she takes Imitrex.  Denies any seizure or seizure activity.  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  I will see scheduled in April or sooner if worse.  1. Partial symptomatic epilepsy with complex partial seizures, not intractable, without status epilepticus (HCC)   2. Migraine without aura and without status migrainosus, not intractable      Patient Instructions  Continue current medications including  Levetiracetam  and Sumatriptan for headaches  Continue to follow up with PCP  Return as scheduled in April.    Per [place]  DMV statutes, patients with seizures are not allowed to drive until they have been seizure-free for six months.  Other recommendations include using caution when using heavy equipment or power tools. Avoid working on ladders or at heights. Take showers instead of baths.  Do not swim alone.  Ensure the water temperature is not too high on the home water heater. Do not go swimming alone. Do not lock yourself in a room alone (i.e. bathroom). When caring for infants or small children, sit down when holding, feeding, or changing them to minimize risk of injury to the child in the event you have a seizure. Maintain good sleep hygiene. Avoid alcohol.  Also recommend adequate sleep, hydration, good diet and minimize stress.   During the Seizure  - First, ensure adequate ventilation and place patients on the floor on their left side  Loosen clothing around the neck and ensure the airway is patent. If the patient is clenching the teeth, do not force the mouth open with any object as this can cause severe damage - Remove all items from the surrounding that can be hazardous. The patient may be oblivious to what's happening and may not even know what he or she is doing. If the patient is confused and wandering, either gently guide him/her away and block access to outside areas - Reassure the individual and  be comforting - Call 911. In most cases, the seizure ends before EMS arrives. However, there are cases when seizures may last over 3 to 5 minutes. Or the individual may have developed breathing difficulties or severe injuries. If a pregnant patient or a person with diabetes develops a seizure, it is prudent to call an ambulance. - Finally, if the patient does not regain full consciousness, then call EMS. Most patients will remain confused for about 45 to 90 minutes after a seizure, so  you must use judgment in calling for help. - Avoid restraints but make sure the patient is in a bed with padded side rails - Place the individual in a lateral position with the neck slightly flexed; this will help the saliva drain from the mouth and prevent the tongue from falling backward - Remove all nearby furniture and other hazards from the area - Provide verbal assurance as the individual is regaining consciousness - Provide the patient with privacy if possible - Call for help and start treatment as ordered by the caregiver   After the Seizure (Postictal Stage)  After a seizure, most patients experience confusion, fatigue, muscle pain and/or a headache. Thus, one should permit the individual to sleep. For the next few days, reassurance is essential. Being calm and helping reorient the person is also of importance.  Most seizures are painless and end spontaneously. Seizures are not harmful to others but can lead to complications such as stress on the lungs, brain and the heart. Individuals with prior lung problems may develop labored breathing and respiratory distress.    Discussed Patients with epilepsy have a small risk of sudden unexpected death, a condition referred to as sudden unexpected death in epilepsy (SUDEP). SUDEP is defined specifically as the sudden, unexpected, witnessed or unwitnessed, nontraumatic and nondrowning death in patients with epilepsy with or without evidence for a seizure, and excluding documented status epilepticus, in which post mortem examination does not reveal a structural or toxicologic cause for death     No orders of the defined types were placed in this encounter.   No orders of the defined types were placed in this encounter.   No follow-ups on file.  Virtual Visit via Video Note  I connected with  SYDNEI OHAVER on 02/08/24 at  3:15 PM EDT by a video enabled telemedicine application and verified that I am speaking with the correct person using  two identifiers.  Location: Patient: Home Provider: [facility] Office   I discussed the limitations of evaluation and management by telemedicine and the availability of in person appointments. The patient expressed understanding and agreed to proceed.   I discussed the assessment and treatment plan with the patient. The patient was provided an opportunity to ask questions and all were answered. The patient agreed with the plan and demonstrated an understanding of the instructions.   The patient was advised to call back or seek an in-person evaluation if the symptoms worsen or if the condition fails to improve as anticipated.  I provided 10 minutes of non-face-to-face time during this encounter.  I have spent a total of 20 minutes dedicated to this patient today, preparing to see patient, performing a medically appropriate examination and evaluation, ordering tests and/or medications and procedures, and counseling and educating the patient/family/caregiver; independently interpreting result and communicating results to the family/patient/caregiver; and documenting clinical information in the electronic medical record.    Pastor Falling, MD 02/08/2024, 3:31 PM  [facility] [address], [address] [place],  [place] [address] [phone]

## 2024-02-08 NOTE — Patient Instructions (Signed)
 Continue current medications including Levetiracetam  and Sumatriptan for headaches  Continue to follow up with PCP  Return as scheduled in April.

## 2024-02-11 ENCOUNTER — Other Ambulatory Visit: Payer: Self-pay | Admitting: Sports Medicine

## 2024-02-17 NOTE — Addendum Note (Signed)
 Addended by: KAYLA JEOFFREY RAMAN on: 02/17/2024 03:48 PM   Modules accepted: Level of Service

## 2024-02-22 ENCOUNTER — Emergency Department (HOSPITAL_BASED_OUTPATIENT_CLINIC_OR_DEPARTMENT_OTHER)
Admission: EM | Admit: 2024-02-22 | Discharge: 2024-02-22 | Disposition: A | Discharge status: Home / Self Care | Attending: Emergency Medicine | Admitting: Emergency Medicine

## 2024-02-22 ENCOUNTER — Encounter (HOSPITAL_BASED_OUTPATIENT_CLINIC_OR_DEPARTMENT_OTHER): Payer: Self-pay

## 2024-02-22 ENCOUNTER — Other Ambulatory Visit: Payer: Self-pay

## 2024-02-22 DIAGNOSIS — G40909 Epilepsy, unspecified, not intractable, without status epilepticus: Secondary | ICD-10-CM | POA: Diagnosis not present

## 2024-02-22 DIAGNOSIS — Z79899 Other long term (current) drug therapy: Secondary | ICD-10-CM | POA: Diagnosis not present

## 2024-02-22 DIAGNOSIS — R569 Unspecified convulsions: Secondary | ICD-10-CM | POA: Diagnosis not present

## 2024-02-22 DIAGNOSIS — R9431 Abnormal electrocardiogram [ECG] [EKG]: Secondary | ICD-10-CM | POA: Diagnosis not present

## 2024-02-22 DIAGNOSIS — R112 Nausea with vomiting, unspecified: Secondary | ICD-10-CM | POA: Insufficient documentation

## 2024-02-22 LAB — URINALYSIS, ROUTINE W REFLEX MICROSCOPIC
Bilirubin Urine: NEGATIVE
Glucose, UA: NEGATIVE mg/dL
Hgb urine dipstick: NEGATIVE
Ketones, ur: 15 mg/dL — AB
Leukocytes,Ua: NEGATIVE
Nitrite: NEGATIVE
Protein, ur: NEGATIVE mg/dL
Specific Gravity, Urine: 1.014 (ref 1.005–1.030)
pH: 7.5 (ref 5.0–8.0)

## 2024-02-22 LAB — CBC WITH DIFFERENTIAL/PLATELET
Abs Immature Granulocytes: 0.01 K/uL (ref 0.00–0.07)
Basophils Absolute: 0 K/uL (ref 0.0–0.1)
Basophils Relative: 0 %
Eosinophils Absolute: 0 K/uL (ref 0.0–0.5)
Eosinophils Relative: 0 %
HCT: 38.2 % (ref 36.0–46.0)
Hemoglobin: 12.6 g/dL (ref 12.0–15.0)
Immature Granulocytes: 0 %
Lymphocytes Relative: 27 %
Lymphs Abs: 1.4 K/uL (ref 0.7–4.0)
MCH: 31.6 pg (ref 26.0–34.0)
MCHC: 33 g/dL (ref 30.0–36.0)
MCV: 95.7 fL (ref 80.0–100.0)
Monocytes Absolute: 0.4 K/uL (ref 0.1–1.0)
Monocytes Relative: 9 %
Neutro Abs: 3.2 K/uL (ref 1.7–7.7)
Neutrophils Relative %: 64 %
Platelets: 247 K/uL (ref 150–400)
RBC: 3.99 MIL/uL (ref 3.87–5.11)
RDW: 12.5 % (ref 11.5–15.5)
WBC: 5.1 K/uL (ref 4.0–10.5)
nRBC: 0 % (ref 0.0–0.2)

## 2024-02-22 LAB — CBG MONITORING, ED: Glucose-Capillary: 117 mg/dL — ABNORMAL HIGH (ref 70–99)

## 2024-02-22 LAB — COMPREHENSIVE METABOLIC PANEL WITH GFR
ALT: 15 U/L (ref 0–44)
AST: 17 U/L (ref 15–41)
Albumin: 4.3 g/dL (ref 3.5–5.0)
Alkaline Phosphatase: 39 U/L (ref 38–126)
Anion gap: 10 (ref 5–15)
BUN: 10 mg/dL (ref 6–20)
CO2: 26 mmol/L (ref 22–32)
Calcium: 9.1 mg/dL (ref 8.9–10.3)
Chloride: 100 mmol/L (ref 98–111)
Creatinine, Ser: 0.68 mg/dL (ref 0.44–1.00)
GFR, Estimated: >60 mL/min (ref >60)
Glucose, Bld: 115 mg/dL — ABNORMAL HIGH (ref 70–99)
Potassium: 3.8 mmol/L (ref 3.5–5.1)
Sodium: 136 mmol/L (ref 135–145)
Total Bilirubin: 0.4 mg/dL (ref 0.0–1.2)
Total Protein: 7 g/dL (ref 6.5–8.1)

## 2024-02-22 LAB — PREGNANCY, URINE: Preg Test, Ur: NEGATIVE

## 2024-02-22 LAB — RESP PANEL BY RT-PCR (RSV, FLU A&B, COVID)  RVPGX2
Influenza A by PCR: NEGATIVE
Influenza B by PCR: NEGATIVE
Resp Syncytial Virus by PCR: NEGATIVE
SARS Coronavirus 2 by RT PCR: NEGATIVE

## 2024-02-22 MED ORDER — ONDANSETRON 4 MG PO TBDP: 4.0000 mg | ORAL_TABLET | Freq: Three times a day (TID) | ORAL | 0 refills | Status: AC | PRN

## 2024-02-22 MED ORDER — SODIUM CHLORIDE 0.9 % IV BOLUS
1000.0000 mL | Freq: Once | INTRAVENOUS | Status: AC
  Administered 2024-02-22: 1000 mL via INTRAVENOUS

## 2024-02-22 MED ORDER — PROCHLORPERAZINE EDISYLATE 10 MG/2ML IJ SOLN
10.0000 mg | Freq: Once | INTRAMUSCULAR | Status: AC
  Administered 2024-02-22: 10 mg via INTRAVENOUS
  Filled 2024-02-22: qty 2

## 2024-02-22 MED ORDER — DIPHENHYDRAMINE HCL 50 MG/ML IJ SOLN
12.5000 mg | Freq: Once | INTRAMUSCULAR | Status: AC
  Administered 2024-02-22: 12.5 mg via INTRAVENOUS
  Filled 2024-02-22: qty 1

## 2024-02-22 MED ORDER — ONDANSETRON HCL 4 MG/2ML IJ SOLN
4.0000 mg | Freq: Once | INTRAMUSCULAR | Status: AC
  Administered 2024-02-22: 4 mg via INTRAVENOUS
  Filled 2024-02-22: qty 2

## 2024-02-22 MED ORDER — LEVETIRACETAM (KEPPRA) 500 MG/5 ML ADULT IV PUSH
1500.0000 mg | Freq: Once | INTRAVENOUS | Status: AC
  Administered 2024-02-22: 1500 mg via INTRAVENOUS
  Filled 2024-02-22: qty 15

## 2024-02-22 NOTE — Discharge Instructions (Addendum)
 Follow up with neurology, return if symptoms worsen  Seizure precautions: Per [place]  DMV statutes, patients with seizures are not allowed to drive until they have been seizure-free for six months and cleared by a physician    Use caution when using heavy equipment or power tools. Avoid working on ladders or at heights. Take showers instead of baths. Ensure the water temperature is not too high on the home water heater. Do not go swimming alone. Do not lock yourself in a room alone (i.e. bathroom). When caring for infants or small children, sit down when holding, feeding, or changing them to minimize risk of injury to the child in the event you have a seizure. Maintain good sleep hygiene. Avoid alcohol.    If patient has another seizure, call 911 and bring them back to the ED if: A.  The seizure lasts longer than 5 minutes.      B.  The patient doesn't wake shortly after the seizure or has new problems such as difficulty seeing, speaking or moving following the seizure C.  The patient was injured during the seizure D.  The patient has a temperature over 102 F (39C) E.  The patient vomited during the seizure and now is having trouble breathing    During the Seizure   - First, ensure adequate ventilation and place patients on the floor on their left side  Loosen clothing around the neck and ensure the airway is patent. If the patient is clenching the teeth, do not force the mouth open with any object as this can cause severe damage - Remove all items from the surrounding that can be hazardous. The patient may be oblivious to what's happening and may not even know what he or she is doing. If the patient is confused and wandering, either gently guide him/her away and block access to outside areas - Reassure the individual and be comforting - Call 911. In most cases, the seizure ends before EMS arrives. However, there are cases when seizures may last over 3 to 5 minutes. Or the individual may  have developed breathing difficulties or severe injuries. If a pregnant patient or a person with diabetes develops a seizure, it is prudent to call an ambulance. - Finally, if the patient does not regain full consciousness, then call EMS. Most patients will remain confused for about 45 to 90 minutes after a seizure, so you must use judgment in calling for help. - Avoid restraints but make sure the patient is in a bed with padded side rails - Place the individual in a lateral position with the neck slightly flexed; this will help the saliva drain from the mouth and prevent the tongue from falling backward - Remove all nearby furniture and other hazards from the area - Provide verbal assurance as the individual is regaining consciousness - Provide the patient with privacy if possible - Call for help and start treatment as ordered by the caregiver    After the Seizure (Postictal Stage)   After a seizure, most patients experience confusion, fatigue, muscle pain and/or a headache. Thus, one should permit the individual to sleep. For the next few days, reassurance is essential. Being calm and helping reorient the person is also of importance.   Most seizures are painless and end spontaneously. Seizures are not harmful to others but can lead to complications such as stress on the lungs, brain and the heart. Individuals with prior lung problems may develop labored breathing and respiratory distress.

## 2024-02-22 NOTE — ED Provider Notes (Signed)
 [place] [facility] AT [facility] Provider Note   CSN: [id number] Arrival date & time: 02/22/24  9197     Patient presents with: Emesis, Nausea, and Seizures   Marie Brown is a 33 y.o. female.   Patient here with nausea and vomiting viral symptoms for the last several days.  Multiple people at work with the same.  She did go home COVID test that was negative.  She has a history of seizures been unable to tolerate medications or p.o. at home.  She has had 1 or 2 seizures this morning she thinks because of this.  She denies any fever but has had bodyaches chills dehydration symptoms.  Denies any abdominal pain at this time.  No diarrhea.  Denies any cough sputum production.  No sore throat.  History of seizures, bipolar, cerebral cavernoma.  Denies any falls.  Seizures have been while in bed witnessed by family.  Did not hit her head.  She feels back to her baseline at this time.  The history is provided by the patient.       Prior to Admission medications   Medication Sig Start Date End Date Taking? Authorizing Provider  ondansetron  (ZOFRAN -ODT) 4 MG disintegrating tablet Take 1 tablet (4 mg total) by mouth every 8 (eight) hours as needed. 02/22/24  Yes Tyrique Sporn, DO  busPIRone  (BUSPAR ) 5 MG tablet Take 1 tablet (5 mg total) by mouth 2 (two) times daily. 12/10/23   Kayla Jeoffrey GORMAN, FNP  levETIRAcetam  (KEPPRA  XR) 500 MG 24 hr tablet Take 1 tablet (500 mg total) by mouth 2 (two) times daily. 07/16/23 07/10/24  Camara, Amadou, MD  meloxicam  (MOBIC ) 7.5 MG tablet TAKE 1 TABLET(7.5 MG) BY MOUTH DAILY 01/06/24   Brooks, Dana, DO  methocarbamol  (ROBAXIN ) 500 MG tablet TAKE 1 TABLET(500 MG) BY MOUTH EVERY 6 HOURS AS NEEDED FOR MUSCLE SPASMS 02/11/24   Brooks, Dana, DO  nadolol (CORGARD) 20 MG tablet Take 20 mg by mouth daily. 10/30/22   [provider]  promethazine  (PHENERGAN ) 25 MG tablet Take 25 mg by mouth every 6 (six) hours as needed for nausea or vomiting.     [provider]  SUMAtriptan (IMITREX) 50 MG tablet Take 50 mg by mouth once.    [provider]  traZODone  (DESYREL ) 100 MG tablet Take 1 tablet (100 mg total) by mouth at bedtime. 12/10/23   Kayla Jeoffrey GORMAN, FNP  valACYclovir (VALTREX) 1000 MG tablet Take 1,000 mg by mouth daily. 03/11/22   [provider]    Allergies: Patient has no known allergies.    Review of Systems  Updated Vital Signs BP 129/79   Pulse 63   Temp 98.6 F (37 C) (Oral)   Resp 18   Ht 5' 3 (1.6 m)   Wt 87.1 kg   LMP 02/09/2024 (Approximate)   SpO2 97%   BMI 34.01 kg/m   Physical Exam Vitals and nursing note reviewed.  Constitutional:      General: She is not in acute distress.    Appearance: She is well-developed. She is not ill-appearing.  HENT:     Head: Normocephalic and atraumatic.     Mouth/Throat:     Mouth: Mucous membranes are dry.  Eyes:     Conjunctiva/sclera: Conjunctivae normal.     Pupils: Pupils are equal, round, and reactive to light.  Cardiovascular:     Rate and Rhythm: Normal rate and regular rhythm.     Pulses: Normal pulses.  Heart sounds: Normal heart sounds. No murmur heard. Pulmonary:     Effort: Pulmonary effort is normal. No respiratory distress.     Breath sounds: Normal breath sounds.  Abdominal:     General: Abdomen is flat.     Palpations: Abdomen is soft.     Tenderness: There is no abdominal tenderness.  Musculoskeletal:        General: No swelling.     Cervical back: Normal range of motion and neck supple.  Skin:    General: Skin is warm and dry.     Capillary Refill: Capillary refill takes less than 2 seconds.  Neurological:     General: No focal deficit present.     Mental Status: She is alert and oriented to person, place, and time.     Cranial Nerves: No cranial nerve deficit.     Sensory: No sensory deficit.     Motor: No weakness.     Coordination: Coordination normal.  Psychiatric:        Mood and Affect: Mood  normal.     (all labs ordered are listed, but only abnormal results are displayed) Labs Reviewed  COMPREHENSIVE METABOLIC PANEL WITH GFR - Abnormal; Notable for the following components:      Result Value   Glucose, Bld 115 (*)    All other components within normal limits  URINALYSIS, ROUTINE W REFLEX MICROSCOPIC - Abnormal; Notable for the following components:   Ketones, ur 15 (*)    All other components within normal limits  CBG MONITORING, ED - Abnormal; Notable for the following components:   Glucose-Capillary 117 (*)    All other components within normal limits  RESP PANEL BY RT-PCR (RSV, FLU A&B, COVID)  RVPGX2  CBC WITH DIFFERENTIAL/PLATELET  PREGNANCY, URINE  LEVETIRACETAM  LEVEL    EKG: EKG Interpretation Date/Time:  Monday February 22 2024 08:13:21 EDT Ventricular Rate:  64 PR Interval:  132 QRS Duration:  81 QT Interval:  417 QTC Calculation: 431 R Axis:   39  Text Interpretation: Sinus rhythm Confirmed by Ruthe Cornet [phone]) on 02/22/2024 8:27:16 AM  Radiology: No results found.   Procedures   Medications Ordered in the ED  levETIRAcetam  (KEPPRA ) undiluted injection 1,500 mg (1,500 mg Intravenous Given 02/22/24 0821)  ondansetron  (ZOFRAN ) injection 4 mg (4 mg Intravenous Given 02/22/24 0820)  sodium chloride  0.9 % bolus 1,000 mL (0 mLs Intravenous Stopped 02/22/24 0933)  prochlorperazine  (COMPAZINE ) injection 10 mg (10 mg Intravenous Given 02/22/24 0914)  diphenhydrAMINE  (BENADRYL ) injection 12.5 mg (12.5 mg Intravenous Given 02/22/24 0914)                                    Medical Decision Making Amount and/or Complexity of Data Reviewed Labs: ordered.  Risk Prescription drug management.   Marie Brown is here with nausea and vomiting and viral symptoms for the last few days.  She has had some breakthrough seizures today as she has been unable to tolerate much p.o. including her medications his last few days.  She works as a Lawyer.  Sick contacts  at work.  She denies any abdominal pain.  She did not hit her head today.  Seizures were witnessed by family.  Short lasting.  She feels like she is back at her baseline now.  She denies any chest pain shortness of breath.  She has had bodyaches and chills.  No diarrhea.  Ultimately differential diagnosis  likely breakthrough seizures in the setting of a viral process/foodborne process.  Will check basic labs CBC CMP lipase urinalysis COVID flu RSV test.  Will give IV fluids IV Zofran  and give a dose of IV Keppra  and reevaluate.  Has a history of seizures history of cerebral cavernoma.  She did not hit her head or fall today.  She not have any traumatic findings.  Neurologically she is intact.  She is not having any abdominal pain.  No concern for any intra-abdominal process at this time.  Per my review and interpretation labs no significant leukocytosis anemia or electrolyte abnormality.  She is feeling much better after antiemetics she is able to drink without any major issues.  Urinalysis negative for infection.  Overall I suspect nausea and vomiting likely from viral process given exposure and similar sick contacts at work.  Seizure likely in the setting of unable to tolerate her meds.  We had a long discussion about observation stay to let her get further symptomatic support versus trial of therapy at home.  Patient felt comfortable going home and trying to continue p.o. challenge at home.  Overall we will have her follow-up with neurology.  Understands return precautions.  Discharged in good condition.  Overall suspect seizures today likely from her GI illness.  Have no concern for intra-abdominal process.  Discharge.  This chart was dictated using voice recognition software.  Despite best efforts to proofread,  errors can occur which can change the documentation meaning.      Final diagnoses:  Nausea and vomiting, unspecified vomiting type  Seizure-like activity [facility])    ED Discharge Orders           Ordered    ondansetron  (ZOFRAN -ODT) 4 MG disintegrating tablet  Every 8 hours PRN        02/22/24 1211               Konstantine Gervasi, DO 02/22/24 1213

## 2024-02-22 NOTE — ED Triage Notes (Addendum)
 Nausea and vomiting since Friday. PMH SZ and unable to keep her keppra  down. She is has had 3 SZ today. Awake and alert at this time. Covid test negative Saturday.

## 2024-02-22 NOTE — ED Notes (Signed)
 Patient experienced apprx 30 second SZ mostly facial and left leg involvement. Started administering Keppra  and SZ subsided. SZ pads in place. Mother at bedside.

## 2024-02-22 NOTE — ED Notes (Signed)
 Spoke with patient and her mother on the phone. Discharge teaching performed and they both have understanding. Paperwork will be left at registration for pickup.

## 2024-02-22 NOTE — ED Notes (Signed)
 In to discharge patient, no patient found. IV had been removed preciously by Cathlyn, NT.

## 2024-02-22 NOTE — ED Notes (Signed)
 Episode of painful dry heaving. Notified provider. Orders for benadryl  and compazine . Administered as ordered. Mother at bedside.

## 2024-02-22 NOTE — ED Notes (Signed)
 PO challenge started at this time with ginger ale. Mother at bedside.

## 2024-02-23 LAB — LEVETIRACETAM LEVEL: Levetiracetam Lvl: 4.7 ug/mL — ABNORMAL LOW (ref 10.0–40.0)

## 2024-03-04 DIAGNOSIS — R55 Syncope and collapse: Secondary | ICD-10-CM | POA: Diagnosis not present

## 2024-03-04 DIAGNOSIS — Z95818 Presence of other cardiac implants and grafts: Secondary | ICD-10-CM | POA: Diagnosis not present

## 2024-03-21 ENCOUNTER — Encounter: Payer: Self-pay | Admitting: Radiology

## 2024-03-22 ENCOUNTER — Ambulatory Visit: Payer: Self-pay

## 2024-03-22 ENCOUNTER — Ambulatory Visit (INDEPENDENT_AMBULATORY_CARE_PROVIDER_SITE_OTHER): Admitting: Sports Medicine

## 2024-03-22 ENCOUNTER — Encounter: Payer: Self-pay | Admitting: Sports Medicine

## 2024-03-22 DIAGNOSIS — M25571 Pain in right ankle and joints of right foot: Secondary | ICD-10-CM | POA: Diagnosis not present

## 2024-03-22 DIAGNOSIS — M25572 Pain in left ankle and joints of left foot: Secondary | ICD-10-CM

## 2024-03-22 DIAGNOSIS — M21622 Bunionette of left foot: Secondary | ICD-10-CM

## 2024-03-22 DIAGNOSIS — Q666 Other congenital valgus deformities of feet: Secondary | ICD-10-CM

## 2024-03-22 MED ORDER — MELOXICAM 7.5 MG PO TABS: 7.5000 mg | ORAL_TABLET | Freq: Every day | ORAL | 0 refills | Status: AC | PRN

## 2024-03-22 NOTE — Progress Notes (Signed)
 Marie Brown - 33 y.o. female MRN [id number]  Date of birth: 07/16/1990  Office Visit Note: Visit Date: 03/22/2024 PCP: Kayla Jeoffrey GORMAN, FNP Referred by: Kayla Jeoffrey GORMAN, FNP  Subjective: Chief Complaint  Patient presents with   Right Ankle - Pain   Left Ankle - Pain   Left Foot - Pain   HPI: Marie Brown is a pleasant 33 y.o. female who presents today for bilateral ankle pain and swelling. Also with pain over left small toe.  Ankles - she has had both pain and swelling in the ankles for about 1 month.  This does worse when she is on her feet, working longer shifts her swelling and pain will be worse.  She states at times she has to work anywhere from 8 to 16 hours at a time, she does work as a LAWYER. She has used ice/heat and occasional naproxen .   Left foot/toe - has pain over the left small toe.  She does report having surgery for bunions in both feet when she was younger.  She has noticed a spot over the superficial aspect of the skin that has grown over the last few months and is painful.  Pertinent ROS were reviewed with the patient and found to be negative unless otherwise specified above in HPI.   Assessment & Plan: Visit Diagnoses:  1. Sinus tarsi syndrome of both ankles   2. Pain in right ankle and joints of right foot   3. Pain in left ankle and joints of left foot   4. Bunionette of left foot    Plan: Impression is bilateral lateral ankle pain and swelling which is indicative of sinus tarsi syndrome. She is experiencing a degree of ankle impingement as well given her functional pes planovalgus.  We discussed treatment options for this.  I do think she would benefit greatly from custom orthotics to help prevent her pes planovalgus and functional hyperpronation.  This will help keep her subtalar joint neutral, I also think they can place some blue EVA padding or support for her bunionette deformity on the left great toe.  Referral sent to [facility] today.  We discussed  low-dose anti-inflammatory, may continue meloxicam  7.5 mg.  I also think she would benefit from compression support, she may continue this with compression socks the longer she is on her feet for work.  Could always consider ankle compression sleeve such as body helix in the future as well.  She does not have significant laxity of her ankle joints, although I do want her to work on proprioception and stability in terms of therapy for the ankles.  She is agreeable to home therapy.  We did print out a customized handout and I did review these exercises with her in the room today, may perform once daily.  After she is fitted for the orthotics and she progresses through her HEP, I would like to see her back and reevaluate.  Additional considerations may include MRI to evaluate for her posterior tibial prominence/os trigonum.  Could always consider sinus tarsi/lateral gutter injections as well.  Follow-up: Return in about 5 weeks (around 04/26/2024) for bilateral ankles .   Meds & Orders:  Meds ordered this encounter  Medications   meloxicam  (MOBIC ) 7.5 MG tablet    Sig: Take 1 tablet (7.5 mg total) by mouth daily as needed for pain.    Dispense:  30 tablet    Refill:  0    Orders Placed This Encounter  Procedures  XR Ankle Complete Right   XR Ankle Complete Left   XR Foot Complete Left     Procedures: No procedures performed      Clinical History: No specialty comments available.  She reports that she has never smoked. She has never used smokeless tobacco.  Recent Labs    08/05/23 1507  HGBA1C 5.2    Objective:   Vital Signs: LMP 02/09/2024 (Approximate)   Physical Exam  Gen: Well-appearing, in no acute distress; non-toxic CV: Well-perfused. Warm.  Resp: Breathing unlabored on room air; no wheezing. Psych: Fluid speech in conversation; appropriate affect; normal thought process  Ortho Exam - Bilateral feet/ankles: There is a mild degree of soft tissue swelling over the lateral  ankle bilaterally, left > right.  There is tenderness near the sinus tarsi region in the region of the underlying ATFL.  There is no ankle effusion noted.  There is no specific laxity with inversion/eversion stress testing although stress inversion does reproduce pain.  There is callus formation on the plantar aspect of the great toe on the left side with mild to moderate bunionette deformity noted.  Well-healed incision from previous bunion surgery bilaterally.  Imaging: XR Ankle Complete Right Result Date: 03/22/2024 3 views of bilateral ankles including AP, mortise, lateral film were ordered and reviewed by myself today.  X-rays demonstrate no significant arthritic change.  There is some soft tissue swelling over the lateral aspect of the ankle bilaterally.  The right ankle does have a notable posterior tibial prominence versus os trigonum.  No acute fracture or otherwise acute bony abnormality noted.  XR Ankle Complete Left Result Date: 03/22/2024 3 views of bilateral ankles including AP, mortise, lateral film were ordered and reviewed by myself today.  X-rays demonstrate no significant arthritic change.  There is some soft tissue swelling over the lateral aspect of the ankle bilaterally.  The right ankle does have a notable posterior tibial prominence versus os trigonum.  No acute fracture or otherwise acute bony abnormality noted.  XR Foot Complete Left Result Date: 03/22/2024 Three-view x-ray of the left foot including AP, oblique and lateral film were ordered and reviewed by myself today.  X-rays straight no significant arthritic change.  There is bunionette deformity with varus alignment of the fifth toe.  There is status post surgical change from first MTP/bunion surgery.  No acute findings.   Past Medical/Family/Surgical/Social History: Medications & Allergies reviewed per EMR, new medications updated. 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   Syncope 03/11/2022   Migraine with aura and without status migrainosus, not intractable 02/05/2022   Palpitations 02/05/2022   Cavernoma 10/17/2021   Family history of thyroid  disease 10/17/2021   Nicotine use disorder 10/17/2021   Closed fracture of lower end of left radius with routine healing 06/26/2021   NECK PAIN 06/13/2009   NAUSEA AND VOMITING 05/24/2009   WEIGHT GAIN 04/22/2009   HEADACHE 04/22/2009   UNSPECIFIED VISUAL LOSS 01/17/2009   FATIGUE 01/17/2009   BIPOLAR DISORDER UNSPECIFIED 08/12/2008   NICOTINE ADDICTION 08/12/2008   Past Medical History:  Diagnosis Date   Bipolar 2 disorder (HCC)    Carpal tunnel syndrome on both sides    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 ;   last MRI in care  everywhere 08-25-2021   History of acute pyelonephritis 03/28/2022   admission in epic w/ UTI due to obstructive uropathy due to kidney stone   History of kidney stones    History of syncope    (04-30-2022  pt stated last episode 03-27-2022  in setting kidney stone pain )   syncope w/ collapse with recurrent near syncope/ syncope ;   cardiologsit --- dr uvaldo ronco note 11-11-2021 work-up results in care everywhere event monitor showed benign w/ low burden PACs/ PVCs & evidence inappropriate ST;  normal ETT and echo   IDA (iron deficiency anemia)    Left ureteral calculus    Migraine    Palpitations    (04-30-2022  pt stated taking toprol  daily has helped)   pt is scheduled for loop recorder insertion 05-08-2022 @ [facility]   Seizure-like activity (HCC)    (04-30-2022  pt stated last syncope/ seizure like activity 03-27-2022 in setting kidney stone pain)   neurologist-- dr forbes. talitha;   started 04/ 2023 with syncope w/ collapse had seizure like acitivity;   MRI   left frontal cerebral cavenoma in 2011 and last MRI in care everywhere 08-25-2021 same;   EEG 04-16-2022 care everywhere normal without seizure acitivy   Urgency of urination    Wears glasses    Family History  Problem Relation Age of Onset   Hypertension Mother    Diabetes Other    Thyroid  disease Other    Heart disease Other    Birth defects Other    Mental illness Other    Stroke Other    Breast cancer Other    Graves' disease Maternal Grandmother    Multiple sclerosis Maternal Grandfather    Suicidality Father    Past Surgical History:  Procedure Laterality Date   CYSTOSCOPY W/ URETERAL STENT PLACEMENT Left 03/28/2022   Procedure: CYSTOSCOPY WITH RETROGRADE PYELOGRAM/URETERAL STENT PLACEMENT;  Surgeon: Selma Donnice SAUNDERS, MD;  Location: [facility];  Service: Urology;  Laterality: Left;   CYSTOSCOPY/URETEROSCOPY/HOLMIUM LASER/STENT PLACEMENT Left 05/02/2022   Procedure: CYSTOSCOPY/LEFT URETEROSCOPY/HOLMIUM LASER/LEFT RETROGRADE PYELOGRAM/LEFT STENT PLACEMENT;  Surgeon: Selma Donnice SAUNDERS, MD;  Location: [facility];  Service: Urology;  Laterality: Left;  60 MINUTES NEEDED FOR CASE.   FOOT SURGERY Bilateral 2005   bunion   LAPAROSCOPIC APPENDECTOMY  12/20/2009   @[facility]  by dr b. zeigler   Social History   Occupational History   Not on file  Tobacco Use   Smoking status: Never   Smokeless tobacco: Never  Vaping Use   Vaping status: Every Day   Substances: Nicotine   Devices: orion  Substance and Sexual Activity   Alcohol use: Not Currently    Comment: 2 per month   Drug use: Never   Sexual activity: Yes    Birth control/protection: Condom   Patient was instructed in 10 minutes of therapeutic exercises for bilateral ankles to improve strength, ROM and function according to my instructions and plan of care during the office visit. A customized handout was provided and demonstration of proper technique shown and discussed. Patient did perform exercises and demonstrate  understanding through teachback.  All questions discussed and answered.  Lonell Sprang, DO Primary Care Sports Medicine Physician  [facility]  This note was dictated using Dragon naturally speaking software and may contain errors in syntax, spelling, or content which have not been identified prior to signing this note.

## 2024-03-22 NOTE — Progress Notes (Signed)
 Patient says that she has had pain and swelling in both ankles for about a month. She says that when she works, her ankles swell sooner and more, and her longer shifts do make these symptoms worse. She works shifts that are anywhere from 8-16 hours. She has tried ice and heat, and only occasionally takes Naproxen . She does have pain with motion, and says that her ankles are tender to touch.   Patient says that she has pain over the left little toe. She says that when she was a kid she had surgery for bunions on both feet. This spot on her foot has grown over the last few months, and is very painful for her.

## 2024-03-30 ENCOUNTER — Other Ambulatory Visit: Payer: Self-pay | Admitting: Sports Medicine

## 2024-03-30 ENCOUNTER — Encounter: Payer: Self-pay | Admitting: Internal Medicine

## 2024-03-30 ENCOUNTER — Ambulatory Visit (INDEPENDENT_AMBULATORY_CARE_PROVIDER_SITE_OTHER): Admitting: Internal Medicine

## 2024-03-30 VITALS — BP 118/46 | Ht 63.0 in | Wt 192.0 lb

## 2024-03-30 DIAGNOSIS — Q666 Other congenital valgus deformities of feet: Secondary | ICD-10-CM

## 2024-03-30 NOTE — Progress Notes (Addendum)
 PCP: Kayla Jeoffrey RAMAN, FNP  Patient is a 33 y.o. female here for custom orthotics.  Recently evaluated by Dr. Burnetta on 11/4 endorsing bilateral ankle pain and swelling as well as pain over the left small toe.  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  She was referred to [facility] for custom orthotics  Past Medical History:  Diagnosis Date   Bipolar 2 disorder (HCC)    Carpal tunnel syndrome on both sides    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 ;   last MRI in care everywhere 08-25-2021   History of acute pyelonephritis 03/28/2022   admission in epic w/ UTI due to obstructive uropathy due to kidney stone   History of kidney stones    History of syncope    (04-30-2022  pt stated last episode 03-27-2022  in setting kidney stone pain )   syncope w/ collapse with recurrent near syncope/ syncope ;   cardiologsit --- dr uvaldo ronco note 11-11-2021 work-up results in care everywhere event monitor showed benign w/ low burden PACs/ PVCs & evidence inappropriate ST;  normal ETT and echo   IDA (iron deficiency anemia)    Left ureteral calculus    Migraine    Palpitations    (04-30-2022  pt stated taking toprol  daily has helped)   pt is scheduled for loop recorder insertion 05-08-2022 @ [facility]   Seizure-like activity (HCC)    (04-30-2022  pt stated last syncope/ seizure like activity 03-27-2022 in setting kidney stone pain)   neurologist-- dr forbes. talitha;   started 04/ 2023 with syncope w/ collapse had seizure like acitivity;   MRI  left frontal cerebral cavenoma in 2011 and last MRI in care everywhere 08-25-2021 same;   EEG 04-16-2022 care everywhere normal without seizure acitivy   Urgency of urination    Wears glasses     Current Outpatient Medications on File Prior to Visit  Medication Sig Dispense Refill   busPIRone  (BUSPAR ) 5 MG tablet Take 1 tablet (5 mg total) by mouth 2 (two) times daily. 60 tablet 3   levETIRAcetam  (KEPPRA  XR) 500 MG 24 hr tablet Take 1 tablet (500 mg total) by mouth 2 (two) times  daily. 180 tablet 3   meloxicam  (MOBIC ) 7.5 MG tablet Take 1 tablet (7.5 mg total) by mouth daily as needed for pain. 30 tablet 0   nadolol (CORGARD) 20 MG tablet Take 20 mg by mouth daily.     ondansetron  (ZOFRAN -ODT) 4 MG disintegrating tablet Take 1 tablet (4 mg total) by mouth every 8 (eight) hours as needed. 20 tablet 0   promethazine  (PHENERGAN ) 25 MG tablet Take 25 mg by mouth every 6 (six) hours as needed for nausea or vomiting.     SUMAtriptan (IMITREX) 50 MG tablet Take 50 mg by mouth once.     traZODone  (DESYREL ) 100 MG tablet Take 1 tablet (100 mg total) by mouth at bedtime. 90 tablet 1   valACYclovir (VALTREX) 1000 MG tablet Take 1,000 mg by mouth daily.     No current facility-administered medications on file prior to visit.    Past Surgical History:  Procedure Laterality Date   CYSTOSCOPY W/ URETERAL STENT PLACEMENT Left 03/28/2022   Procedure: CYSTOSCOPY WITH RETROGRADE PYELOGRAM/URETERAL STENT PLACEMENT;  Surgeon: Selma Donnice SAUNDERS, MD;  Location: [facility];  Service: Urology;  Laterality: Left;   CYSTOSCOPY/URETEROSCOPY/HOLMIUM LASER/STENT PLACEMENT Left 05/02/2022   Procedure: CYSTOSCOPY/LEFT URETEROSCOPY/HOLMIUM LASER/LEFT RETROGRADE PYELOGRAM/LEFT STENT PLACEMENT;  Surgeon: Selma Donnice SAUNDERS, MD;  Location: [facility];  Service: Urology;  Laterality: Left;  60 MINUTES NEEDED FOR CASE.   FOOT SURGERY Bilateral 2005   bunion   LAPAROSCOPIC APPENDECTOMY  12/20/2009   @[facility]  by dr b. zeigler    No Known Allergies  BP (!) 118/46   Ht 5' 3 (1.6 m)   Wt 192 lb (87.1 kg)   BMI 34.01 kg/m       No data to display              No data to display              Objective:  Physical Exam:  Gen: NAD, comfortable in exam room  Bilateral feet Bilateral pes planus with prominence of the navicular heads Mild calcaneal valgus R>L Bunionette deformity on the left foot Slight hallux valgus deformity of the right great toe  Assessment and Plan:  Pes  planovalgus Referred by Dr. 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  A fifth ray post was also added to address the bunionette deformity on the left foot.  She will follow-up with Dr. Burnetta next month for reassessment.  Patient was fitted for [employer] Run semi-rigid orthotics  The orthotic was heated, placed on the orthotic stand. The patient was positioned in subtalar neutral position and 10 degrees of ankle dorsiflexion in a weight bearing stance on the heated orthotic blank After completion of molding Blank: [employer]n Run - size 6 Posting: 5th ray on left foot Base: none

## 2024-04-04 DIAGNOSIS — R55 Syncope and collapse: Secondary | ICD-10-CM | POA: Diagnosis not present

## 2024-04-04 DIAGNOSIS — Z95818 Presence of other cardiac implants and grafts: Secondary | ICD-10-CM | POA: Diagnosis not present

## 2024-04-20 ENCOUNTER — Other Ambulatory Visit: Payer: Self-pay | Admitting: Family Medicine

## 2024-05-03 ENCOUNTER — Other Ambulatory Visit: Payer: Self-pay | Admitting: Sports Medicine

## 2024-05-05 DIAGNOSIS — R55 Syncope and collapse: Secondary | ICD-10-CM | POA: Diagnosis not present

## 2024-05-05 DIAGNOSIS — Z95818 Presence of other cardiac implants and grafts: Secondary | ICD-10-CM | POA: Diagnosis not present

## 2024-05-14 ENCOUNTER — Encounter: Payer: Self-pay | Admitting: Family Medicine

## 2024-05-23 ENCOUNTER — Encounter: Payer: Self-pay | Admitting: Internal Medicine

## 2024-05-25 ENCOUNTER — Other Ambulatory Visit: Payer: Self-pay | Admitting: Family Medicine

## 2024-06-01 ENCOUNTER — Ambulatory Visit: Admitting: Family Medicine

## 2024-06-09 ENCOUNTER — Telehealth: Payer: Self-pay

## 2024-06-09 DIAGNOSIS — R55 Syncope and collapse: Secondary | ICD-10-CM

## 2024-06-09 NOTE — Progress Notes (Signed)
 Complex Care Management Note  Care Guide Note 06/09/2024 Name: Marie Brown MRN: [id number] DOB: December 11, 1990  Marie Brown is a 34 y.o. year old female who sees Kayla Jeoffrey GORMAN, FNP for primary care. I reached out to Marie Brown by phone today to offer complex care management services.  Ms. Furgason was given information about Complex Care Management services today including:   The Complex Care Management services include support from the care team which includes your Nurse Care Manager, Clinical Social Worker, or Pharmacist.  The Complex Care Management team is here to help remove barriers to the health concerns and goals most important to you. Complex Care Management services are voluntary, and the patient may decline or stop services at any time by request to their care team member.   Complex Care Management Consent Status: Patient agreed to services and verbal consent obtained.   Follow up plan:  Telephone appointment with complex care management team member scheduled for:  07/08/24 at 1:30 p.m.  Encounter Outcome:  Patient Scheduled  Dreama [facility]  [facility], [facility] VBCI Assistant Direct Dial: [phone]  Fax: [phone]

## 2024-06-19 DEATH — deceased

## 2024-07-08 ENCOUNTER — Telehealth: Admitting: Licensed Clinical Social Worker

## 2024-08-01 ENCOUNTER — Other Ambulatory Visit

## 2024-08-08 ENCOUNTER — Encounter: Admitting: Family Medicine

## 2024-08-30 ENCOUNTER — Ambulatory Visit: Admitting: Neurology
